# Patient Record
Sex: Female | Born: 1955 | Race: White | Hispanic: No | Marital: Married | Smoking: Current every day smoker
Health system: Southern US, Community
[De-identification: ages and names within clinical notes are randomized; demographics above are authoritative.]

## PROBLEM LIST (undated history)

## (undated) DIAGNOSIS — R519 Headache, unspecified: Secondary | ICD-10-CM

## (undated) DIAGNOSIS — K219 Gastro-esophageal reflux disease without esophagitis: Secondary | ICD-10-CM

## (undated) DIAGNOSIS — E039 Hypothyroidism, unspecified: Secondary | ICD-10-CM

## (undated) DIAGNOSIS — I10 Essential (primary) hypertension: Secondary | ICD-10-CM

## (undated) DIAGNOSIS — Z720 Tobacco use: Secondary | ICD-10-CM

## (undated) DIAGNOSIS — R51 Headache: Secondary | ICD-10-CM

## (undated) DIAGNOSIS — N189 Chronic kidney disease, unspecified: Secondary | ICD-10-CM

## (undated) DIAGNOSIS — E785 Hyperlipidemia, unspecified: Secondary | ICD-10-CM

## (undated) DIAGNOSIS — R079 Chest pain, unspecified: Secondary | ICD-10-CM

## (undated) HISTORY — DX: Chest pain, unspecified: R07.9

## (undated) HISTORY — PX: KIDNEY SURGERY: SHX687

## (undated) HISTORY — DX: Tobacco use: Z72.0

## (undated) HISTORY — DX: Hypothyroidism, unspecified: E03.9

## (undated) HISTORY — PX: OTHER SURGICAL HISTORY: SHX169

## (undated) HISTORY — DX: Gastro-esophageal reflux disease without esophagitis: K21.9

## (undated) HISTORY — DX: Chronic kidney disease, unspecified: N18.9

## (undated) HISTORY — DX: Hyperlipidemia, unspecified: E78.5

---

## 1999-09-05 ENCOUNTER — Emergency Department (HOSPITAL_COMMUNITY): Admission: EM | Admit: 1999-09-05 | Discharge: 1999-09-05 | Payer: Self-pay | Admitting: Emergency Medicine

## 1999-09-05 ENCOUNTER — Encounter: Payer: Self-pay | Admitting: Emergency Medicine

## 1999-09-08 ENCOUNTER — Emergency Department (HOSPITAL_COMMUNITY): Admission: EM | Admit: 1999-09-08 | Discharge: 1999-09-08 | Payer: Self-pay | Admitting: Emergency Medicine

## 2006-04-22 ENCOUNTER — Other Ambulatory Visit: Admission: RE | Admit: 2006-04-22 | Discharge: 2006-04-22 | Payer: Self-pay | Admitting: Obstetrics and Gynecology

## 2007-03-24 ENCOUNTER — Emergency Department (HOSPITAL_COMMUNITY): Admission: EM | Admit: 2007-03-24 | Discharge: 2007-03-24 | Payer: Self-pay | Admitting: Emergency Medicine

## 2009-08-06 ENCOUNTER — Emergency Department (HOSPITAL_COMMUNITY): Admission: EM | Admit: 2009-08-06 | Discharge: 2009-08-06 | Payer: Self-pay | Admitting: Emergency Medicine

## 2010-07-02 LAB — DIFFERENTIAL
Basophils Absolute: 0.1 10*3/uL (ref 0.0–0.1)
Basophils Relative: 1 % (ref 0–1)
Eosinophils Absolute: 0.2 10*3/uL (ref 0.0–0.7)
Eosinophils Relative: 4 % (ref 0–5)
Lymphocytes Relative: 29 % (ref 12–46)
Lymphs Abs: 1.5 10*3/uL (ref 0.7–4.0)
Monocytes Absolute: 0.4 10*3/uL (ref 0.1–1.0)
Monocytes Relative: 7 % (ref 3–12)
Neutro Abs: 3.1 10*3/uL (ref 1.7–7.7)
Neutrophils Relative %: 59 % (ref 43–77)

## 2010-07-02 LAB — POCT I-STAT, CHEM 8
BUN: 19 mg/dL (ref 6–23)
Calcium, Ion: 1.13 mmol/L (ref 1.12–1.32)
Chloride: 107 mEq/L (ref 96–112)
Creatinine, Ser: 0.7 mg/dL (ref 0.4–1.2)
Glucose, Bld: 98 mg/dL (ref 70–99)
HCT: 48 % — ABNORMAL HIGH (ref 36.0–46.0)
Hemoglobin: 16.3 g/dL — ABNORMAL HIGH (ref 12.0–15.0)
Potassium: 3.8 mEq/L (ref 3.5–5.1)
Sodium: 141 mEq/L (ref 135–145)
TCO2: 27 mmol/L (ref 0–100)

## 2010-07-02 LAB — CBC
HCT: 43 % (ref 36.0–46.0)
Hemoglobin: 14.4 g/dL (ref 12.0–15.0)
MCHC: 33.7 g/dL (ref 30.0–36.0)
MCV: 93.6 fL (ref 78.0–100.0)
Platelets: 326 10*3/uL (ref 150–400)
RBC: 4.59 MIL/uL (ref 3.87–5.11)
RDW: 13.8 % (ref 11.5–15.5)
WBC: 5.3 10*3/uL (ref 4.0–10.5)

## 2011-01-20 LAB — DIFFERENTIAL
Basophils Relative: 0
Eosinophils Relative: 0
Lymphs Abs: 1.5
Monocytes Absolute: 0.7

## 2011-01-20 LAB — CBC
HCT: 43.6
MCV: 92.4
Platelets: 327
RBC: 4.71
WBC: 13.9 — ABNORMAL HIGH

## 2011-01-20 LAB — BASIC METABOLIC PANEL
BUN: 17
Chloride: 103
Potassium: 3.9

## 2015-07-17 ENCOUNTER — Observation Stay (HOSPITAL_COMMUNITY): Payer: Self-pay

## 2015-07-17 ENCOUNTER — Observation Stay (HOSPITAL_COMMUNITY)
Admission: EM | Admit: 2015-07-17 | Discharge: 2015-07-18 | Disposition: A | Discharge status: Home / Self Care | Payer: Self-pay | Attending: Internal Medicine | Admitting: Internal Medicine

## 2015-07-17 ENCOUNTER — Emergency Department (HOSPITAL_COMMUNITY): Payer: Self-pay

## 2015-07-17 ENCOUNTER — Encounter (HOSPITAL_COMMUNITY): Payer: Self-pay | Admitting: Emergency Medicine

## 2015-07-17 DIAGNOSIS — E039 Hypothyroidism, unspecified: Secondary | ICD-10-CM | POA: Insufficient documentation

## 2015-07-17 DIAGNOSIS — R079 Chest pain, unspecified: Secondary | ICD-10-CM | POA: Insufficient documentation

## 2015-07-17 DIAGNOSIS — F17200 Nicotine dependence, unspecified, uncomplicated: Secondary | ICD-10-CM | POA: Insufficient documentation

## 2015-07-17 DIAGNOSIS — I160 Hypertensive urgency: Principal | ICD-10-CM | POA: Insufficient documentation

## 2015-07-17 DIAGNOSIS — R519 Headache, unspecified: Secondary | ICD-10-CM | POA: Insufficient documentation

## 2015-07-17 DIAGNOSIS — Z720 Tobacco use: Secondary | ICD-10-CM | POA: Diagnosis present

## 2015-07-17 DIAGNOSIS — R51 Headache: Secondary | ICD-10-CM

## 2015-07-17 DIAGNOSIS — I110 Hypertensive heart disease with heart failure: Secondary | ICD-10-CM | POA: Insufficient documentation

## 2015-07-17 DIAGNOSIS — I5032 Chronic diastolic (congestive) heart failure: Secondary | ICD-10-CM | POA: Insufficient documentation

## 2015-07-17 DIAGNOSIS — G43909 Migraine, unspecified, not intractable, without status migrainosus: Secondary | ICD-10-CM | POA: Insufficient documentation

## 2015-07-17 DIAGNOSIS — E785 Hyperlipidemia, unspecified: Secondary | ICD-10-CM | POA: Insufficient documentation

## 2015-07-17 DIAGNOSIS — K219 Gastro-esophageal reflux disease without esophagitis: Secondary | ICD-10-CM | POA: Insufficient documentation

## 2015-07-17 DIAGNOSIS — N189 Chronic kidney disease, unspecified: Secondary | ICD-10-CM | POA: Insufficient documentation

## 2015-07-17 DIAGNOSIS — I10 Essential (primary) hypertension: Secondary | ICD-10-CM | POA: Insufficient documentation

## 2015-07-17 HISTORY — DX: Chest pain, unspecified: R07.9

## 2015-07-17 HISTORY — DX: Tobacco use: Z72.0

## 2015-07-17 HISTORY — DX: Headache: R51

## 2015-07-17 HISTORY — DX: Headache, unspecified: R51.9

## 2015-07-17 HISTORY — DX: Essential (primary) hypertension: I10

## 2015-07-17 LAB — BASIC METABOLIC PANEL
Anion gap: 13 (ref 5–15)
BUN: 21 mg/dL — ABNORMAL HIGH (ref 6–20)
CALCIUM: 9.7 mg/dL (ref 8.9–10.3)
CO2: 28 mmol/L (ref 22–32)
CREATININE: 1.15 mg/dL — AB (ref 0.44–1.00)
Chloride: 100 mmol/L — ABNORMAL LOW (ref 101–111)
GFR calc Af Amer: 59 mL/min — ABNORMAL LOW (ref >60)
GFR calc non Af Amer: 51 mL/min — ABNORMAL LOW (ref >60)
GLUCOSE: 102 mg/dL — AB (ref 65–99)
Potassium: 3.7 mmol/L (ref 3.5–5.1)
Sodium: 141 mmol/L (ref 135–145)

## 2015-07-17 LAB — CBC WITH DIFFERENTIAL/PLATELET
BASOS ABS: 0 10*3/uL (ref 0.0–0.1)
Basophils Relative: 1 %
EOS ABS: 0.2 10*3/uL (ref 0.0–0.7)
EOS PCT: 3 %
HCT: 45 % (ref 36.0–46.0)
Hemoglobin: 14.4 g/dL (ref 12.0–15.0)
LYMPHS ABS: 2.4 10*3/uL (ref 0.7–4.0)
LYMPHS PCT: 31 %
MCH: 28.3 pg (ref 26.0–34.0)
MCHC: 32 g/dL (ref 30.0–36.0)
MCV: 88.6 fL (ref 78.0–100.0)
MONO ABS: 0.5 10*3/uL (ref 0.1–1.0)
Monocytes Relative: 7 %
Neutro Abs: 4.5 10*3/uL (ref 1.7–7.7)
Neutrophils Relative %: 58 %
PLATELETS: 356 10*3/uL (ref 150–400)
RBC: 5.08 MIL/uL (ref 3.87–5.11)
RDW: 13.2 % (ref 11.5–15.5)
WBC: 7.6 10*3/uL (ref 4.0–10.5)

## 2015-07-17 LAB — URINE MICROSCOPIC-ADD ON

## 2015-07-17 LAB — URINALYSIS, ROUTINE W REFLEX MICROSCOPIC
Bilirubin Urine: NEGATIVE
GLUCOSE, UA: NEGATIVE mg/dL
Ketones, ur: NEGATIVE mg/dL
Nitrite: NEGATIVE
PROTEIN: NEGATIVE mg/dL
SPECIFIC GRAVITY, URINE: 1.012 (ref 1.005–1.030)
pH: 6 (ref 5.0–8.0)

## 2015-07-17 LAB — TROPONIN I: Troponin I: <0.03 ng/mL (ref <0.031)

## 2015-07-17 LAB — TSH: TSH: 11.861 u[IU]/mL — ABNORMAL HIGH (ref 0.350–4.500)

## 2015-07-17 MED ORDER — ASPIRIN EC 81 MG PO TBEC
81.0000 mg | DELAYED_RELEASE_TABLET | Freq: Every day | ORAL | Status: DC
  Administered 2015-07-17 – 2015-07-18 (×2): 81 mg via ORAL
  Filled 2015-07-17 (×2): qty 1

## 2015-07-17 MED ORDER — SODIUM CHLORIDE 0.9% FLUSH: 3.0000 mL | Freq: Two times a day (BID) | INTRAVENOUS | Status: DC

## 2015-07-17 MED ORDER — GI COCKTAIL ~~LOC~~
30.0000 mL | Freq: Two times a day (BID) | ORAL | Status: DC | PRN
  Filled 2015-07-17: qty 30

## 2015-07-17 MED ORDER — ACETAMINOPHEN 650 MG RE SUPP: 650.0000 mg | Freq: Four times a day (QID) | RECTAL | Status: DC | PRN

## 2015-07-17 MED ORDER — TRAMADOL HCL 50 MG PO TABS
50.0000 mg | ORAL_TABLET | Freq: Four times a day (QID) | ORAL | Status: DC | PRN
  Administered 2015-07-17 – 2015-07-18 (×2): 50 mg via ORAL
  Filled 2015-07-17 (×2): qty 1

## 2015-07-17 MED ORDER — AMLODIPINE BESYLATE 5 MG PO TABS
5.0000 mg | ORAL_TABLET | Freq: Every day | ORAL | Status: DC
  Administered 2015-07-17 – 2015-07-18 (×2): 5 mg via ORAL
  Filled 2015-07-17 (×2): qty 1

## 2015-07-17 MED ORDER — LORATADINE 10 MG PO TABS
10.0000 mg | ORAL_TABLET | Freq: Every day | ORAL | Status: DC | PRN
  Filled 2015-07-17: qty 1

## 2015-07-17 MED ORDER — ACETAMINOPHEN 325 MG PO TABS
650.0000 mg | ORAL_TABLET | Freq: Four times a day (QID) | ORAL | Status: DC | PRN
  Administered 2015-07-17: 650 mg via ORAL
  Filled 2015-07-17: qty 2

## 2015-07-17 MED ORDER — ENOXAPARIN SODIUM 40 MG/0.4ML ~~LOC~~ SOLN
40.0000 mg | SUBCUTANEOUS | Status: DC
  Administered 2015-07-17: 40 mg via SUBCUTANEOUS
  Filled 2015-07-17 (×2): qty 0.4

## 2015-07-17 MED ORDER — ONDANSETRON HCL 4 MG/2ML IJ SOLN
4.0000 mg | Freq: Four times a day (QID) | INTRAMUSCULAR | Status: DC | PRN
  Administered 2015-07-18: 4 mg via INTRAVENOUS
  Filled 2015-07-17 (×3): qty 2

## 2015-07-17 MED ORDER — SODIUM CHLORIDE 0.9% FLUSH
3.0000 mL | Freq: Two times a day (BID) | INTRAVENOUS | Status: DC
  Administered 2015-07-17: 3 mL via INTRAVENOUS

## 2015-07-17 MED ORDER — NITROGLYCERIN 0.4 MG SL SUBL
0.4000 mg | SUBLINGUAL_TABLET | Freq: Once | SUBLINGUAL | Status: AC
  Administered 2015-07-17: 0.4 mg via SUBLINGUAL
  Filled 2015-07-17: qty 1

## 2015-07-17 MED ORDER — SODIUM CHLORIDE 0.9 % IV SOLN: 250.0000 mL | INTRAVENOUS | Status: DC | PRN

## 2015-07-17 MED ORDER — ONDANSETRON HCL 4 MG/2ML IJ SOLN
4.0000 mg | Freq: Once | INTRAMUSCULAR | Status: AC
  Administered 2015-07-17: 4 mg via INTRAVENOUS

## 2015-07-17 MED ORDER — HYDRALAZINE HCL 20 MG/ML IJ SOLN
10.0000 mg | Freq: Four times a day (QID) | INTRAMUSCULAR | Status: DC | PRN
  Filled 2015-07-17: qty 1

## 2015-07-17 MED ORDER — TRAMADOL HCL 50 MG PO TABS
25.0000 mg | ORAL_TABLET | Freq: Once | ORAL | Status: AC
  Administered 2015-07-17: 25 mg via ORAL
  Filled 2015-07-17: qty 1

## 2015-07-17 MED ORDER — ONDANSETRON HCL 4 MG PO TABS: 4.0000 mg | ORAL_TABLET | Freq: Four times a day (QID) | ORAL | Status: DC | PRN

## 2015-07-17 MED ORDER — CARVEDILOL 6.25 MG PO TABS
6.2500 mg | ORAL_TABLET | Freq: Two times a day (BID) | ORAL | Status: DC
  Administered 2015-07-17: 6.25 mg via ORAL
  Filled 2015-07-17 (×2): qty 1

## 2015-07-17 MED ORDER — PANTOPRAZOLE SODIUM 40 MG PO TBEC
40.0000 mg | DELAYED_RELEASE_TABLET | Freq: Every day | ORAL | Status: DC
  Administered 2015-07-17 – 2015-07-18 (×2): 40 mg via ORAL
  Filled 2015-07-17 (×2): qty 1

## 2015-07-17 MED ORDER — HYDRALAZINE HCL 20 MG/ML IJ SOLN
10.0000 mg | Freq: Once | INTRAMUSCULAR | Status: AC
  Administered 2015-07-17: 10 mg via INTRAVENOUS
  Filled 2015-07-17: qty 1

## 2015-07-17 MED ORDER — SODIUM CHLORIDE 0.9% FLUSH: 3.0000 mL | INTRAVENOUS | Status: DC | PRN

## 2015-07-17 NOTE — Care Management Note (Signed)
Case Management Note  Patient Details  Name: Danielle Berger MRN: [id number] Date of Birth: Jun 24, 1955  Subjective/Objective:   60 y/o f admitted w/hypertensive urgency. From home.ED CM has provided pcp listing.                 Action/Plan:d/c plan home.   Expected Discharge Date:   (unknown)               Expected Discharge Plan:  Home/Self Care  In-House Referral:     Discharge planning Services  CM Consult  Post Acute Care Choice:    Choice offered to:     DME Arranged:    DME Agency:     HH Arranged:    HH Agency:     Status of Service:  In process, will continue to follow  Medicare Important Message Given:    Date Medicare IM Given:    Medicare IM give by:    Date Additional Medicare IM Given:    Additional Medicare Important Message give by:     If discussed at [place] of Stay Meetings, dates discussed:    Additional Comments:  Dessa Phi, RN 07/17/2015, 3:33 PM

## 2015-07-17 NOTE — Progress Notes (Signed)
Entered in d/c instructions  Please use the resources provided to you in emergency room by case manager to assist with doctor for follow up Call As needed- A referral for you has been sent to [facility] if you have not received a call in 3 days you may contact them Call Sylvie Farrier at [email or web address] These [place] uninsured resources provide possible primary care providers, resources for discounted medications, housing, dental resources, affordable care act information, plus other resources for [facility]

## 2015-07-17 NOTE — Progress Notes (Signed)
CM spoke with pt and husband Ronalee Belts at the bedside who confirms uninsured [employer] resident with no pcp.  Ronalee Belts is seen by a [facility] group and is also going to attempt to ask the office manager there what it would take for pt to be seen for f/u care  CM discussed and provided written information for uninsured accepting pcps, discussed the importance of pcp vs EDP services for f/u care, www.needymeds.org, www.goodrx.com, discounted pharmacies and other [employer] such as [employer] [facility] [employer], affordable care act, financial assistance, uninsured dental services, [place] med assist, DSS and  health department  Reviewed resources for [employer] uninsured accepting pcps like [facility], family medicine at [employer], [facility] [place], [facility], local urgent care centers, [facility], [facility] family practice, general medical clinics, family services of the [place], [facility] urgent care plus others, medication resources, [facility] out patient [facility]ies and housing Pt voiced understanding and appreciation of resources provided   Provided [facility] contact information Pt agreed to a referral Cm completed referral Pt to be contact by [facility] clinical liason

## 2015-07-17 NOTE — H&P (Signed)
[facility] History and Physical  Danielle Berger [id number] DOB: [birth year] DOA: 07/17/2015  Referring physician: Dr.Pickering PCP: No primary care provider on file.   Chief Complaint: High blood pressure  HPI: Danielle Berger is a 60 y.o. female who presents with constant, throbbing headaches with photophobia for the past month that she rates 4/10. Her husband purchased a blood pressure cuff 3 days ago, and her blood pressure was 240/140. She has a past history of migraines, though she has not had one in years and feels this headache is different from her usual migraines. She also reports a burning sensation in the middle of her chest when she takes out the trash for 1 month. She has not seen a doctor in 20+ years and smokes 1/2PPD.   Review of Systems:  Constitutional:  No weight loss, night sweats, Fevers, chills, fatigue.   HEENT:  Difficulty swallowing,Tooth/dental problems,Sore throat,  No sneezing, itching, ear ache, nasal congestion, post nasal drip,   Cardio-vascular:  Orthopnea, PND, swelling in lower extremities, anasarca, dizziness, palpitations   GI:  No heartburn, indigestion, abdominal pain, nausea, vomiting, diarrhea, change in bowel habits, loss of appetite   Resp:  No shortness of breath with exertion or at rest. No excess mucus, no productive cough, No non-productive cough, No coughing up of blood.No change in color of mucus.No wheezing.No chest wall deformity   Skin:  no rash or lesions.   GU:  no dysuria, change in color of urine, no urgency or frequency. No flank pain.   Musculoskeletal:  No joint pain or swelling. No decreased range of motion. No back pain.   Psych:  No change in mood or affect. No depression or anxiety. No memory loss.   History reviewed. No pertinent past medical history. Past Surgical History  Procedure Laterality Date  . Brain tumor removed       when patient was in the 6th grade  . Kidney surgery      when patient  was int he 8th grade   Social History:  reports that she has been smoking.  She has never used smokeless tobacco. She reports that she does not drink alcohol. Her drug history is not on file.  No Known Allergies  History reviewed. No pertinent family history.   Prior to Admission medications   Medication Sig Start Date End Date Taking? Authorizing Provider  loratadine (CLARITIN) 10 MG tablet Take 10 mg by mouth daily as needed for allergies.   Yes Historical Provider, MD   Physical Exam: Filed Vitals:   07/17/15 0908 07/17/15 1120  BP: 223/115 200/114  Pulse: 65 65  Temp: 98.2 F (36.8 C) 98.6 F (37 C)  TempSrc: Oral Oral  Resp: 18 16  SpO2: 98% 98%    Wt Readings from Last 3 Encounters:  No data found for Wt    General:  Appears calm and comfortable Eyes: PERRL, normal lids, irises & conjunctiva ENT: grossly normal hearing, lips & tongue Neck: no LAD, masses or thyromegaly Cardiovascular: RRR, no m/r/g. No LE edema. Telemetry: SR, no arrhythmias  Respiratory: CTA bilaterally, no w/r/r. Normal respiratory effort. Abdomen: soft, ntnd Skin: no rash or induration seen on limited exam Musculoskeletal: grossly normal tone BUE/BLE Psychiatric: grossly normal mood and affect, speech fluent and appropriate Neurologic: grossly non-focal.          Labs on Admission:  Basic Metabolic Panel:  Recent Labs Lab 07/17/15 1011  NA 141  K 3.7  CL 100*  CO2 28  GLUCOSE 102*  BUN 21*  CREATININE 1.15*  CALCIUM 9.7   Liver Function Tests: No results for input(s): AST, ALT, ALKPHOS, BILITOT, PROT, ALBUMIN in the last 168 hours. No results for input(s): LIPASE, AMYLASE in the last 168 hours. No results for input(s): AMMONIA in the last 168 hours. CBC:  Recent Labs Lab 07/17/15 1011  WBC 7.6  NEUTROABS 4.5  HGB 14.4  HCT 45.0  MCV 88.6  PLT 356   Cardiac Enzymes:  Recent Labs Lab 07/17/15 1011  TROPONINI <0.03    BNP (last 3 results) No results for  input(s): BNP in the last 8760 hours.  ProBNP (last 3 results) No results for input(s): PROBNP in the last 8760 hours.  CBG: No results for input(s): GLUCAP in the last 168 hours.  Radiological Exams on Admission: Dg Chest 2 View  07/17/2015  CLINICAL DATA:  Cough. EXAM: CHEST  2 VIEW COMPARISON:  None. FINDINGS: The heart size and mediastinal contours are within normal limits. Both lungs are clear. The visualized skeletal structures are unremarkable. IMPRESSION: No active cardiopulmonary disease. Electronically Signed   By: Marijo Conception, M.D.   On: 07/17/2015 10:38    EKG: Independently reviewed. NSR  Assessment/Plan Principal Problem: Hypertensive urgency: BP was 223/115 on arrival, she has not seen a doctor in 20+ years and smokes 1/2PPD. Start Amlodipine PO 5mg , Coreg PO 6.25mg , and IV hydralazine 10mg . Will monitor blood pressure and reassess. If blood pressure is persistently high, we will then start her on  IV antihypertensives-but for now we will try and see if we can manage this with oral medications.  Active Problems: Headache: Likely from uncontrolled hypertension. Although she does have a history of migraine headaches-current headache is very different from her usual migraine headaches. Do not think we need any imaging at this point as she has a completely benign exam. Will monitor as blood pressure improves.   Exertional chest pain: Although exertional, it is burning in nature, initial EKG and Troponin negative. For now we will attempt better blood pressure control, obtain echocardiogram, check lipid panel and A1c. Will place on both aspirin and PPI, if echo shows significant wall motion abnormality, may need inpatient cardiology evaluation. But if troponins continue to be negative, suspect could pursue a outpatient risk stratification.  Tobacco abuse: Smokes 1/2 PPD.  Counseled  History of hypothyroidism: Has been off medication for years, will check TSH.  Chronic kidney  disease: Likely from long-standing hypertension, creatinine 1.15, GFR 51. Will check UA and monitor.  Code Status: Full DVT Prophylaxis: Lovenox, SCDs Family Communication: Husband at bedside Disposition Plan: Admit  Time spent: 30 minutes  Kara Dies PA-S [facility]  Attending MD note  Patient was seen, examined,treatment plan was discussed with the PA-S.  I have personally reviewed the clinical findings, lab, imaging studies and management of this patient in detail. I agree with the documentation, as recorded by the PA-S.   60 year old female with very remote history of hypothyroidism (used to take thyroid medications when she was a teenager) long-standing history of ongoing tobacco use-was not seen a physician for a number of "years"-brought to the ED by her husband for uncontrolled hypertension along with chest pain and headaches.  On exam-she is neurologically intact, heart sounds are regular without any murmurs. Abdomen is benign.  Will give her 1 dose of IV hydralazine, and then start oral amlodipine and Coreg. Will use prn IV Hydralazine.  Admit to tele and continue to optimize BP regimen.Cycle troponins and  obtain Echo  Rest as above.  [facility]

## 2015-07-17 NOTE — ED Provider Notes (Signed)
CSN: [id number]     Arrival date & time 07/17/15  0859 History   First MD Initiated Contact with Patient 07/17/15 [id number]     Chief Complaint  Patient presents with  . Hypertension      Patient is a 60 y.o. female presenting with hypertension. The history is provided by the patient and the spouse.  Hypertension Associated symptoms include chest pain, headaches and shortness of breath. Pertinent negatives include no abdominal pain.  Patient presents with high blood pressure. States she's been having headaches for a while now. States it is dull. States that her husband checked her blood pressure home and it was 240/140. States she does get occasional shortness of breath and chest pain. States it is a pressure middle of her chest. States that she gets more short of breath and more tightness if she attempts to walk to take out the trash cans. No swelling or legs. She does not see a doctor. She does smoke. States she's been getting headaches for while. They are throbbing and on the right side of her head.  History reviewed. No pertinent past medical history. Past Surgical History  Procedure Laterality Date  . Brain tumor removed       when patient was in the 6th grade  . Kidney surgery      when patient was int he 8th grade   History reviewed. No pertinent family history. Social History  Substance Use Topics  . Smoking status: Current Every Day Smoker  . Smokeless tobacco: Never Used  . Alcohol Use: No   OB History    No data available     Review of Systems  Constitutional: Negative for activity change and appetite change.  Eyes: Negative for pain.  Respiratory: Positive for cough and shortness of breath. Negative for chest tightness.   Cardiovascular: Positive for chest pain. Negative for leg swelling.  Gastrointestinal: Negative for nausea, vomiting, abdominal pain and diarrhea.  Genitourinary: Negative for flank pain.  Musculoskeletal: Negative for back pain and neck stiffness.   Skin: Negative for rash.  Neurological: Positive for headaches. Negative for weakness and numbness.  Psychiatric/Behavioral: Negative for behavioral problems.      Allergies  Review of patient's allergies indicates no known allergies.  Home Medications   Prior to Admission medications   Medication Sig Start Date End Date Taking? Authorizing Provider  loratadine (CLARITIN) 10 MG tablet Take 10 mg by mouth daily as needed for allergies.   Yes Historical Provider, MD   BP 200/114 mmHg  Pulse 65  Temp(Src) 98.6 F (37 C) (Oral)  Resp 16  SpO2 98% Physical Exam  Constitutional: She is oriented to person, place, and time. She appears well-developed and well-nourished.  HENT:  Head: Normocephalic and atraumatic.  Eyes: EOM are normal. Pupils are equal, round, and reactive to light.  Neck: Normal range of motion.  Cardiovascular: Normal rate, regular rhythm and normal heart sounds.   No murmur heard. Pulmonary/Chest: Effort normal and breath sounds normal. No respiratory distress. She has no wheezes. She has no rales.  Abdominal: Soft. Bowel sounds are normal. She exhibits no distension.  Musculoskeletal: Normal range of motion.  Neurological: She is alert and oriented to person, place, and time.  Skin: Skin is warm.  Psychiatric: Her speech is normal.  Nursing note and vitals reviewed.   ED Course  Procedures (including critical care time) Labs Review Labs Reviewed  BASIC METABOLIC PANEL - Abnormal; Notable for the following:    Chloride 100 (*)  Glucose, Bld 102 (*)    BUN 21 (*)    Creatinine, Ser 1.15 (*)    GFR calc non Af Amer 51 (*)    GFR calc Af Amer 59 (*)    All other components within normal limits  URINALYSIS, ROUTINE W REFLEX MICROSCOPIC (NOT AT [facility]) - Abnormal; Notable for the following:    Hgb urine dipstick TRACE (*)    Leukocytes, UA TRACE (*)    All other components within normal limits  URINE MICROSCOPIC-ADD ON - Abnormal; Notable for the  following:    Squamous Epithelial / LPF 6-30 (*)    Bacteria, UA MANY (*)    All other components within normal limits  URINE CULTURE  CBC WITH DIFFERENTIAL/PLATELET  TROPONIN I    Imaging Review Dg Chest 2 View  07/17/2015  CLINICAL DATA:  Cough. EXAM: CHEST  2 VIEW COMPARISON:  None. FINDINGS: The heart size and mediastinal contours are within normal limits. Both lungs are clear. The visualized skeletal structures are unremarkable. IMPRESSION: No active cardiopulmonary disease. Electronically Signed   By: Marijo Conception, M.D.   On: 07/17/2015 10:38   I have personally reviewed and evaluated these images and lab results as part of my medical decision-making.   EKG Interpretation   Date/Time:  Tuesday July 17 2015 09:13:33 EDT Ventricular Rate:  58 PR Interval:  143 QRS Duration: 98 QT Interval:  442 QTC Calculation: 434 R Axis:   75 Text Interpretation:  Sinus rhythm Probable left atrial enlargement  Confirmed by Alvino Chapel  MD, Ovid Curd [phone]) on 07/17/2015 9:52:07 AM      MDM   Final diagnoses:  Essential hypertension  Chest pain, unspecified chest pain type    Patient presented with hypertension and occasional chest pain. EKG and lab work overall reassuring but creatinine is mildly elevated. Patient reportedly has had some kidney problems in the past. Blood pressure is moderately to severely elevated here. Will admit to internal medicine since she's had chest pain.    Davonna Belling, MD 07/17/15 [phone]

## 2015-07-17 NOTE — ED Notes (Signed)
Per pt, states no PCP and hasnt been to MD in years-complaining of headache and vomiting-states BP high

## 2015-07-17 NOTE — ED Notes (Signed)
20 minute timer started 

## 2015-07-18 ENCOUNTER — Telehealth: Payer: Self-pay

## 2015-07-18 ENCOUNTER — Observation Stay (HOSPITAL_BASED_OUTPATIENT_CLINIC_OR_DEPARTMENT_OTHER): Payer: MEDICAID

## 2015-07-18 DIAGNOSIS — N183 Chronic kidney disease, stage 3 (moderate): Secondary | ICD-10-CM

## 2015-07-18 DIAGNOSIS — K219 Gastro-esophageal reflux disease without esophagitis: Secondary | ICD-10-CM

## 2015-07-18 DIAGNOSIS — R079 Chest pain, unspecified: Secondary | ICD-10-CM

## 2015-07-18 DIAGNOSIS — N189 Chronic kidney disease, unspecified: Secondary | ICD-10-CM | POA: Insufficient documentation

## 2015-07-18 DIAGNOSIS — I10 Essential (primary) hypertension: Secondary | ICD-10-CM | POA: Insufficient documentation

## 2015-07-18 DIAGNOSIS — R519 Headache, unspecified: Secondary | ICD-10-CM | POA: Insufficient documentation

## 2015-07-18 DIAGNOSIS — R51 Headache: Secondary | ICD-10-CM

## 2015-07-18 DIAGNOSIS — Z720 Tobacco use: Secondary | ICD-10-CM

## 2015-07-18 DIAGNOSIS — I160 Hypertensive urgency: Principal | ICD-10-CM

## 2015-07-18 DIAGNOSIS — E039 Hypothyroidism, unspecified: Secondary | ICD-10-CM

## 2015-07-18 DIAGNOSIS — E785 Hyperlipidemia, unspecified: Secondary | ICD-10-CM | POA: Insufficient documentation

## 2015-07-18 LAB — HEMOGLOBIN A1C
Hgb A1c MFr Bld: 5.8 % — ABNORMAL HIGH (ref 4.8–5.6)
MEAN PLASMA GLUCOSE: 120 mg/dL

## 2015-07-18 LAB — LIPID PANEL
CHOL/HDL RATIO: 8.6 ratio
Cholesterol: 317 mg/dL — ABNORMAL HIGH (ref 0–200)
HDL: 37 mg/dL — ABNORMAL LOW (ref >40)
LDL Cholesterol: 215 mg/dL — ABNORMAL HIGH (ref 0–99)
Triglycerides: 324 mg/dL — ABNORMAL HIGH (ref <150)
VLDL: 65 mg/dL — AB (ref 0–40)

## 2015-07-18 LAB — BASIC METABOLIC PANEL
Anion gap: 10 (ref 5–15)
BUN: 23 mg/dL — AB (ref 6–20)
CHLORIDE: 104 mmol/L (ref 101–111)
CO2: 28 mmol/L (ref 22–32)
Calcium: 9.7 mg/dL (ref 8.9–10.3)
Creatinine, Ser: 1.12 mg/dL — ABNORMAL HIGH (ref 0.44–1.00)
GFR calc Af Amer: >60 mL/min (ref >60)
GFR calc non Af Amer: 52 mL/min — ABNORMAL LOW (ref >60)
Glucose, Bld: 116 mg/dL — ABNORMAL HIGH (ref 65–99)
POTASSIUM: 3.6 mmol/L (ref 3.5–5.1)
Sodium: 142 mmol/L (ref 135–145)

## 2015-07-18 LAB — ECHOCARDIOGRAM COMPLETE
Height: 62 in
Weight: 2480 oz

## 2015-07-18 LAB — URINE CULTURE

## 2015-07-18 LAB — TROPONIN I

## 2015-07-18 MED ORDER — ASPIRIN 81 MG PO TBEC: 81.0000 mg | DELAYED_RELEASE_TABLET | Freq: Every day | ORAL | Status: AC

## 2015-07-18 MED ORDER — LEVOTHYROXINE SODIUM 100 MCG PO TABS: 100.0000 ug | ORAL_TABLET | Freq: Every day | ORAL | Status: DC

## 2015-07-18 MED ORDER — LEVOTHYROXINE SODIUM 100 MCG PO TABS
100.0000 ug | ORAL_TABLET | Freq: Every day | ORAL | Status: DC
  Administered 2015-07-18: 100 ug via ORAL
  Filled 2015-07-18: qty 1

## 2015-07-18 MED ORDER — AMLODIPINE BESYLATE 10 MG PO TABS: 10.0000 mg | ORAL_TABLET | Freq: Every day | ORAL | Status: DC

## 2015-07-18 MED ORDER — PRAVASTATIN SODIUM 40 MG PO TABS: 40.0000 mg | ORAL_TABLET | Freq: Every day | ORAL | Status: DC

## 2015-07-18 MED ORDER — CARVEDILOL 12.5 MG PO TABS
12.5000 mg | ORAL_TABLET | Freq: Two times a day (BID) | ORAL | Status: DC
Start: 2015-07-18 — End: 2015-07-18
  Administered 2015-07-18 (×2): 12.5 mg via ORAL
  Filled 2015-07-18 (×2): qty 1

## 2015-07-18 MED ORDER — PANTOPRAZOLE SODIUM 40 MG PO TBEC: 40.0000 mg | DELAYED_RELEASE_TABLET | Freq: Every day | ORAL | Status: DC

## 2015-07-18 MED ORDER — CARVEDILOL 12.5 MG PO TABS: 12.5000 mg | ORAL_TABLET | Freq: Two times a day (BID) | ORAL | Status: DC

## 2015-07-18 NOTE — Discharge Summary (Signed)
Physician Discharge Summary  Danielle Berger [id number] DOB: [birth year] DOA: 07/17/2015  PCP: No primary care provider on file.  Admit date: 07/17/2015 Discharge date: 07/18/2015  Time spent: 40 minutes  Recommendations for Outpatient Follow-up:  1. Reassess BP and adjust medications as needed  2. Repeat BMET to follow electrolytes and renal function  3. Repeat thyroid function test in 4-6 weeks; adjust synthroid if needed at that time 4. Repeat LFT's and lipid panel in 6-8 weeks; adjust statins as needed  5. If further CP recurred will benefit of outpatient stress test    Discharge Diagnoses:  Principal Problem:   Hypertensive urgency Active Problems:   Chest pain   Tobacco abuse   Essential hypertension   HLD (hyperlipidemia)   Thyroid activity decreased   Esophageal reflux Renal failure: presumed to be chronic base on GFR (stage 3) Chronic diastolic heart failure  Discharge Condition: stable and improved. Discharge home with instructions for heart healthy diet and appointment to establish/follow with PCP.  Diet recommendation: heart healthy/low sodium diet   Filed Weights   07/17/15 1426  Weight: 70.308 kg (155 lb)    History of present illness:  As per H&P written by Dr. Sloan Berger on 07/17/15 60 y.o. female who presents with constant, throbbing headaches with photophobia for the past month that Danielle Berger rates 4/10. Her husband purchased a blood pressure cuff 3 days ago, and her blood pressure was 240/140. Danielle Berger has a past history of migraines, though Danielle Berger has not had one in years and feels this headache is different from her usual migraines. Danielle Berger also reports a burning sensation in the middle of her chest when Danielle Berger takes out the trash for 1 month. Danielle Berger has not seen a doctor in 20+ years and smokes 1/2PPD.   Hospital Course:  1-hypertensive urgency: BP 223/115 on arrival. Received IV hydralazine in ED -improved with initiation of amlodipine and coreg -no HA's, no CP and no SOB at  discharge -BP with systolic in 99991111 range and diastolic BP in the 99991111 -discharge on amlodipine and coreg and advise to follow low sodium diet -advise to quit smoking -will follow up with PCP in 1-2 weeks for further medication adjustments  2-migraine: chronic. Component of HTN as part of HA's -advise to take medications as prescribed and to minimize use of NSAID's -B-blocker for BP will help to prevent HA as well  3-tobacco abuse: -cessation counseling provided  4-HLD: -discharge on pravachol -advise to follow heart healthy diet  -LDL 215, TG 324 and HDL 37  5-GERD: discharge on PPI  6-atypical CP: most likely due to GERD -no abnormalities on telemetry, neg troponin and EKG w/o acute ischemic changes -patient discharge on ASA, statins and B-blocker (for control of cormobidities and risk factors) -also on PPI -if further CP recurred will benefit of outpatient stratification  7-hypothyroidism: -discharge on synthroid  -TSH 11.861   8-renal failure: appears chronic base on GFR (stage 3) and associated with chronic hypertension  -will need outpatient follow up of renal function -advise to minimize use of NSAID's -BP control and low sodium diet  -advise to maintain good hydration   9-chronic diastolic HF -daily weights and low sodium diet recommended -EF preserved -continue BP control -no volume overload requiring diuretics currently    Procedures:  2-D echo: - Left ventricle: The cavity size was normal. Systolic function was  normal. The estimated ejection fraction was in the range of 60%  to 65%. Wall motion was normal; there were no regional wall  motion abnormalities. Features are consistent with a pseudonormal  left ventricular filling pattern, with concomitant abnormal  relaxation and increased filling pressure (grade 2 diastolic  dysfunction). Doppler parameters are consistent with high  ventricular filling pressure. - Aortic valve: Trileaflet; normal  thickness, mildly calcified  leaflets. There was trivial regurgitation. - Mitral valve: There was mild regurgitation.  Consultations:  None   Discharge Exam: Filed Vitals:   07/18/15 1337 07/18/15 1658  BP: 165/84 156/98  Pulse: 63 58  Temp: 97.8 F (36.6 C)   Resp: 16     General: afebrile, denies CP, SOB, HA's, nausea and vomiting. Reports some reflux symptoms, but otherwise stable and in no acute distress. Patient wants to go home  Cardiovascular: RRR, no rubs, no gallops, no murmurs Respiratory: CTA bilaterally Abd: soft, NT, ND, positive BS Extremities: no edema, no cyanosis   Discharge Instructions   Discharge Instructions    Diet - low sodium heart healthy    Complete by:  As directed      Discharge instructions    Complete by:  As directed   Take medications as prescribed Keep yourself well hydrated Follow a low sodium diet  Be compliant with outpatient follow up visit to establish care with PCP          Discharge Medication List as of 07/18/2015  4:33 PM    START taking these medications   Details  amLODipine (NORVASC) 10 MG tablet Take 1 tablet (10 mg total) by mouth daily., Starting 07/18/2015, Until Discontinued, Print    aspirin EC 81 MG EC tablet Take 1 tablet (81 mg total) by mouth daily., Starting 07/18/2015, Until Discontinued, OTC    carvedilol (COREG) 12.5 MG tablet Take 1 tablet (12.5 mg total) by mouth 2 (two) times daily with a meal., Starting 07/18/2015, Until Discontinued, Print    levothyroxine (SYNTHROID, LEVOTHROID) 100 MCG tablet Take 1 tablet (100 mcg total) by mouth daily before breakfast., Starting 07/18/2015, Until Discontinued, Print    pantoprazole (PROTONIX) 40 MG tablet Take 1 tablet (40 mg total) by mouth daily., Starting 07/18/2015, Until Discontinued, Print    pravastatin (PRAVACHOL) 40 MG tablet Take 1 tablet (40 mg total) by mouth daily., Starting 07/18/2015, Until Discontinued, Print      CONTINUE these medications which have NOT  CHANGED   Details  loratadine (CLARITIN) 10 MG tablet Take 10 mg by mouth daily as needed for allergies., Until Discontinued, Historical Med       No Known Allergies Follow-up Information    Call Please use the resources provided to you in emergency room by case manager to assist with doctor for follow up .   Why:  As needed- A referral for you has been sent to [facility] if you have not received a call in 3 days you may contact them Call Sylvie Farrier at [place] [email or web address]   Contact information:   These [place] uninsured resources provide possible primary care providers, resources for discounted medications, housing, dental resources, affordable care act information, plus other resources for [employer]        Follow up with [facility] 08/06/2015.   Why:  11a   Contact information:   [address] [place] [address] [phone]      The results of significant diagnostics from this hospitalization (including imaging, microbiology, ancillary and laboratory) are listed below for reference.    Significant Diagnostic Studies: Dg Chest 2 View  07/17/2015  CLINICAL DATA:  Cough. EXAM: CHEST  2 VIEW COMPARISON:  None. FINDINGS: The heart size and mediastinal contours are within normal limits. Both lungs are clear. The visualized skeletal structures are unremarkable. IMPRESSION: No active cardiopulmonary disease. Electronically Signed   By: Marijo Conception, M.D.   On: 07/17/2015 10:38   Ct Head Wo Contrast  07/17/2015  CLINICAL DATA:  59 year old presenting with intractable right-sided headache over the past 3 days associated with nausea. Current hypertension. EXAM: CT HEAD WITHOUT CONTRAST TECHNIQUE: Contiguous axial images were obtained from the base of the skull through the vertex without intravenous contrast. COMPARISON:  03/24/2007. FINDINGS: Ventricular system  normal in size and appearance for age. No mass lesion. No midline shift. No acute hemorrhage or hematoma. No extra-axial fluid collections. No evidence of acute infarction. No significant interval change. No skull fracture or other focal osseous abnormality involving the skull. Visualized paranasal sinuses, bilateral mastoid air cells and bilateral middle ear cavities well-aerated. Mild bilateral carotid siphon atherosclerosis. IMPRESSION: 1. No acute intracranial abnormality. 2. Mild bilateral carotid siphon atherosclerosis, progressive since 2008. Electronically Signed   By: Evangeline Dakin M.D.   On: 07/17/2015 18:36    Microbiology: Recent Results (from the past 240 hour(s))  Urine culture     Status: None   Collection Time: 07/17/15 10:17 AM  Result Value Ref Range Status   Specimen Description URINE, CLEAN CATCH  Final   Special Requests NONE  Final   Culture MULTIPLE SPECIES PRESENT, SUGGEST RECOLLECTION  Final   Report Status 07/18/2015 FINAL  Final     Labs: Basic Metabolic Panel:  Recent Labs Lab 07/17/15 1011 07/18/15 0236  NA 141 142  K 3.7 3.6  CL 100* 104  CO2 28 28  GLUCOSE 102* 116*  BUN 21* 23*  CREATININE 1.15* 1.12*  CALCIUM 9.7 9.7   CBC:  Recent Labs Lab 07/17/15 1011  WBC 7.6  NEUTROABS 4.5  HGB 14.4  HCT 45.0  MCV 88.6  PLT 356   Cardiac Enzymes:  Recent Labs Lab 07/17/15 1011 07/17/15 1446 07/17/15 2050 07/18/15 0236  TROPONINI <0.03 <0.03 <0.03 <0.03   Signed:  Barton Dubois MD.  [facility] 07/18/2015, 5:18 PM

## 2015-07-18 NOTE — Progress Notes (Signed)
Patient is alert and oriented x4 ambulatory. Discharge instructions reviewed, questions, concerns denied.

## 2015-07-18 NOTE — Telephone Encounter (Signed)
Message received from Dessa Phi, RN CM requesting a hospital follow up appointment for the patient at [facility]. An appointment was scheduled for 08/06/15 @ 1100 and the information was placed on the AVS.  Update provided to K. Mahabir. RN CM

## 2015-07-18 NOTE — Care Management Note (Signed)
Case Management Note  Patient Details  Name: Danielle Berger MRN: [id number] Date of Birth: 11/18/1955  Subjective/Objective: Provided patient w/pcp listing-she is contacting her spouse to check if he made an appt w/[facility] for patient's pcp-if not she may agree to allow CM to set up appt w/[facility] or she may decide to get a pcp on her own.Patient will use her spouse's pharmacy.                   Action/Plan:d/c plan home.   Expected Discharge Date:   (unknown)               Expected Discharge Plan:  Home/Self Care  In-House Referral:  PCP / [facility]  Discharge planning Services  CM Consult, [facility]  Post Acute Care Choice:    Choice offered to:     DME Arranged:    DME Agency:     HH Arranged:    HH Agency:     Status of Service:  In process, will continue to follow  Medicare Important Message Given:    Date Medicare IM Given:    Medicare IM give by:    Date Additional Medicare IM Given:    Additional Medicare Important Message give by:     If discussed at [place] of Stay Meetings, dates discussed:    Additional Comments:  Dessa Phi, RN 07/18/2015, 1:30 PM

## 2015-07-18 NOTE — Progress Notes (Signed)
Echocardiogram 2D Echocardiogram has been performed.  Joelene Millin 07/18/2015, 4:10 PM

## 2015-07-18 NOTE — Plan of Care (Signed)
Problem: Education: Goal: Knowledge of [facility] Education information/materials will improve Outcome: Completed/Met Date Met:  07/18/15 Education provided regarding HTN, risk factors, treatment. Compliance of medication importance

## 2015-07-19 MED FILL — ?CARVEDILOL 12.5 MG TABLET: 12.5 | 30 days supply | Qty: 60 | Fill #0

## 2015-07-19 MED FILL — ?AMLODIPINE BESYLATE 10 MG: 10 | 30 days supply | Qty: 30 | Fill #0

## 2015-07-19 MED FILL — PRAVASTATIN NA 40 MG TAB: 40 | 30 days supply | Qty: 30 | Fill #0

## 2015-07-19 MED FILL — ?PANTOPRAZOLE SOD DR 40MG: 40 MG | 30 days supply | Qty: 30 | Fill #0

## 2015-07-19 MED FILL — ?LEVOTHYROXINE 100 MCG TAB: 100 | 30 days supply | Qty: 30 | Fill #0

## 2015-08-06 ENCOUNTER — Encounter: Payer: Self-pay | Admitting: Clinical

## 2015-08-06 ENCOUNTER — Ambulatory Visit: Payer: Self-pay | Attending: Family Medicine | Admitting: Family Medicine

## 2015-08-06 VITALS — BP 137/78 | HR 65 | Temp 98.4°F | Resp 16 | Ht 62.0 in | Wt 152.8 lb

## 2015-08-06 DIAGNOSIS — E785 Hyperlipidemia, unspecified: Secondary | ICD-10-CM | POA: Insufficient documentation

## 2015-08-06 DIAGNOSIS — R0789 Other chest pain: Secondary | ICD-10-CM | POA: Insufficient documentation

## 2015-08-06 DIAGNOSIS — R5383 Other fatigue: Secondary | ICD-10-CM | POA: Insufficient documentation

## 2015-08-06 DIAGNOSIS — I10 Essential (primary) hypertension: Secondary | ICD-10-CM | POA: Insufficient documentation

## 2015-08-06 DIAGNOSIS — Z720 Tobacco use: Secondary | ICD-10-CM

## 2015-08-06 DIAGNOSIS — E039 Hypothyroidism, unspecified: Secondary | ICD-10-CM | POA: Insufficient documentation

## 2015-08-06 DIAGNOSIS — R51 Headache: Secondary | ICD-10-CM | POA: Insufficient documentation

## 2015-08-06 DIAGNOSIS — Z7982 Long term (current) use of aspirin: Secondary | ICD-10-CM | POA: Insufficient documentation

## 2015-08-06 DIAGNOSIS — E038 Other specified hypothyroidism: Secondary | ICD-10-CM

## 2015-08-06 DIAGNOSIS — F17210 Nicotine dependence, cigarettes, uncomplicated: Secondary | ICD-10-CM | POA: Insufficient documentation

## 2015-08-06 DIAGNOSIS — Z79899 Other long term (current) drug therapy: Secondary | ICD-10-CM | POA: Insufficient documentation

## 2015-08-06 NOTE — Progress Notes (Signed)
Subjective:  Patient ID: Danielle Berger, female    DOB: Oct 09, 1955  Age: 60 y.o. MRN: [id number]  CC: Hypertension and Hospitalization Follow-up   HPI Danielle Berger presents today for follow-up from hospitalization after hospitalized at [facility] from 07/17/15- 07/18/15 after she had presented with headaches and was found to have Hypertensive urgency with systolic BP of 99991111, also had chest burning.  She was ruled out for acute coronary syndrome, 2-D echo revealed EF of 123456, grade 2 diastolic dysfunction. She was placed on antihypertensives with improvement in her blood pressure. PPI was commenced for atypical chest pain likely due to GERD. Statin was also commenced due to elevated lipids and Synthroid started due to incidental finding of elevated TSH at 11.861. Her symptoms improved and she was subsequently discharged.  She denies chest pain, reflux, headaches at this time but admits to being fatigued and "having low energy" especially when she has to take out the trash. Denies shortness of breath, pedal edema. She continues to smoke half a pack of cigarettes a day and is not ready to quit yet.  Outpatient Prescriptions Prior to Visit  Medication Sig Dispense Refill  . amLODipine (NORVASC) 10 MG tablet Take 1 tablet (10 mg total) by mouth daily. 30 tablet 1  . aspirin EC 81 MG EC tablet Take 1 tablet (81 mg total) by mouth daily.    . carvedilol (COREG) 12.5 MG tablet Take 1 tablet (12.5 mg total) by mouth 2 (two) times daily with a meal. 60 tablet 1  . levothyroxine (SYNTHROID, LEVOTHROID) 100 MCG tablet Take 1 tablet (100 mcg total) by mouth daily before breakfast. 30 tablet 1  . loratadine (CLARITIN) 10 MG tablet Take 10 mg by mouth daily as needed for allergies.    . pantoprazole (PROTONIX) 40 MG tablet Take 1 tablet (40 mg total) by mouth daily. 30 tablet 1  . pravastatin (PRAVACHOL) 40 MG tablet Take 1 tablet (40 mg total) by mouth daily. 30 tablet 1   No  facility-administered medications prior to visit.    ROS Review of Systems  Constitutional: Negative for activity change, appetite change and fatigue.  HENT: Negative for congestion, sinus pressure and sore throat.   Eyes: Negative for visual disturbance.  Respiratory: Negative for cough, chest tightness, shortness of breath and wheezing.   Cardiovascular: Negative for chest pain and palpitations.  Gastrointestinal: Negative for abdominal pain, constipation and abdominal distention.  Endocrine: Negative for polydipsia.  Genitourinary: Negative for dysuria and frequency.  Musculoskeletal: Negative for back pain and arthralgias.  Skin: Negative for rash.  Neurological: Negative for tremors, light-headedness and numbness.  Hematological: Does not bruise/bleed easily.  Psychiatric/Behavioral: Negative for behavioral problems and agitation.    Objective:  BP 137/78 mmHg  Pulse 65  Temp(Src) 98.4 F (36.9 C) (Oral)  Resp 16  Ht 5\' 2"  (1.575 m)  Wt 152 lb 12.8 oz (69.31 kg)  BMI 27.94 kg/m2  SpO2 99%  BP/Weight 08/06/2015 0000000 [id number]  Systolic BP 0000000 A999333 -  Diastolic BP 78 98 -  Wt. (Lbs) 152.8 - 155  BMI 27.94 - 28.34      Physical Exam  Constitutional: She is oriented to person, place, and time. She appears well-developed and well-nourished.  Cardiovascular: Normal rate, normal heart sounds and intact distal pulses.   No murmur heard. Pulmonary/Chest: Effort normal and breath sounds normal. She has no wheezes. She has no rales. She exhibits no tenderness.  Abdominal: Soft. Bowel sounds are normal. She exhibits  no distension and no mass. There is no tenderness.  Musculoskeletal: Normal range of motion.  Neurological: She is alert and oriented to person, place, and time.  Skin: Skin is warm and dry.  Psychiatric: She has a normal mood and affect.   CMP Latest Ref Rng 07/18/2015 07/17/2015 08/06/2009  Glucose 65 - 99 mg/dL 116(H) 102(H) 98  BUN 6 - 20 mg/dL 23(H) 21(H) 19   Creatinine 0.44 - 1.00 mg/dL 1.12(H) 1.15(H) 0.7  Sodium 135 - 145 mmol/L 142 141 141  Potassium 3.5 - 5.1 mmol/L 3.6 3.7 3.8  Chloride 101 - 111 mmol/L 104 100(L) 107  CO2 22 - 32 mmol/L 28 28 -  Calcium 8.9 - 10.3 mg/dL 9.7 9.7 -     Lab Results  Component Value Date   TSH 11.861* 07/17/2015      Assessment & Plan:   1. Essential hypertension Controlled Continue antihypertensives Low-sodium DASH diet  2. Tobacco abuse Smoking cessation support: smoking cessation hotline: 1-800-QUIT-NOW.  Smoking cessation classes are available through [facility]. Call [phone] or visit our website at [email or web address]  Spent 3 minutes counseling on smoking cessation and patient is not ready to quit.  3. HLD (hyperlipidemia) Uncontrolled Currently on statin  4. Other specified hypothyroidism Uncontrolled with elevated TSH which could explain fatigue Recently commenced on Synthroid. Thyroid function in 6 weeks  5. Atypical chest pain Resolved She remains on a PPI presumptively for GI etiology. Will discontinue PPI at next office visit   No orders of the defined types were placed in this encounter.    Follow-up: Return in about 6 weeks (around 09/17/2015), or if symptoms worsen or fail to improve, for follow up of fatigue.   Arnoldo Morale MD

## 2015-08-06 NOTE — Progress Notes (Signed)
Patient's here for hospital f/up HTN.  Patient denies any pain today.

## 2015-08-06 NOTE — Patient Instructions (Signed)
Smoking Cessation, Tips for Success If you are ready to quit smoking, congratulations! You have chosen to help yourself be healthier. Cigarettes bring nicotine, tar, carbon monoxide, and other irritants into your body. Your lungs, heart, and blood vessels will be able to work better without these poisons. There are many different ways to quit smoking. Nicotine gum, nicotine patches, a nicotine inhaler, or nicotine nasal spray can help with physical craving. Hypnosis, support groups, and medicines help break the habit of smoking. WHAT THINGS CAN I DO TO MAKE QUITTING EASIER?  Here are some tips to help you quit for good:  Pick a date when you will quit smoking completely. Tell all of your friends and family about your plan to quit on that date.  Do not try to slowly cut down on the number of cigarettes you are smoking. Pick a quit date and quit smoking completely starting on that day.  Throw away all cigarettes.   Clean and remove all ashtrays from your home, work, and car.  On a card, write down your reasons for quitting. Carry the card with you and read it when you get the urge to smoke.  Cleanse your body of nicotine. Drink enough water and fluids to keep your urine clear or pale yellow. Do this after quitting to flush the nicotine from your body.  Learn to predict your moods. Do not let a bad situation be your excuse to have a cigarette. Some situations in your life might tempt you into wanting a cigarette.  Never have "just one" cigarette. It leads to wanting another and another. Remind yourself of your decision to quit.  Change habits associated with smoking. If you smoked while driving or when feeling stressed, try other activities to replace smoking. Stand up when drinking your coffee. Brush your teeth after eating. Sit in a different chair when you read the paper. Avoid alcohol while trying to quit, and try to drink fewer caffeinated beverages. Alcohol and caffeine may urge you to  smoke.  Avoid foods and drinks that can trigger a desire to smoke, such as sugary or spicy foods and alcohol.  Ask people who smoke not to smoke around you.  Have something planned to do right after eating or having a cup of coffee. For example, plan to take a walk or exercise.  Try a relaxation exercise to calm you down and decrease your stress. Remember, you may be tense and nervous for the first 2 weeks after you quit, but this will pass.  Find new activities to keep your hands busy. Play with a pen, coin, or rubber band. Doodle or draw things on paper.  Brush your teeth right after eating. This will help cut down on the craving for the taste of tobacco after meals. You can also try mouthwash.   Use oral substitutes in place of cigarettes. Try using lemon drops, carrots, cinnamon sticks, or chewing gum. Keep them handy so they are available when you have the urge to smoke.  When you have the urge to smoke, try deep breathing.  Designate your home as a nonsmoking area.  If you are a heavy smoker, ask your health care provider about a prescription for nicotine chewing gum. It can ease your withdrawal from nicotine.  Reward yourself. Set aside the cigarette money you save and buy yourself something nice.  Look for support from others. Join a support group or smoking cessation program. Ask someone at home or at work to help you with your plan   to quit smoking.  Always ask yourself, "Do I need this cigarette or is this just a reflex?" Tell yourself, "Today, I choose not to smoke," or "I do not want to smoke." You are reminding yourself of your decision to quit.  Do not replace cigarette smoking with electronic cigarettes (commonly called e-cigarettes). The safety of e-cigarettes is unknown, and some may contain harmful chemicals.  If you relapse, do not give up! Plan ahead and think about what you will do the next time you get the urge to smoke. HOW WILL I FEEL WHEN I QUIT SMOKING? You  may have symptoms of withdrawal because your body is used to nicotine (the addictive substance in cigarettes). You may crave cigarettes, be irritable, feel very hungry, cough often, get headaches, or have difficulty concentrating. The withdrawal symptoms are only temporary. They are strongest when you first quit but will go away within 10-14 days. When withdrawal symptoms occur, stay in control. Think about your reasons for quitting. Remind yourself that these are signs that your body is healing and getting used to being without cigarettes. Remember that withdrawal symptoms are easier to treat than the major diseases that smoking can cause.  Even after the withdrawal is over, expect periodic urges to smoke. However, these cravings are generally short lived and will go away whether you smoke or not. Do not smoke! WHAT RESOURCES ARE AVAILABLE TO HELP ME QUIT SMOKING? Your health care provider can direct you to community resources or hospitals for support, which may include:  Group support.  Education.  Hypnosis.  Therapy.   This information is not intended to replace advice given to you by your health care provider. Make sure you discuss any questions you have with your health care provider.   Document Released: 12/28/2003 Document Revised: 04/21/2014 Document Reviewed: 09/16/2012 Elsevier Interactive Patient Education 2016 [employer].  

## 2015-08-06 NOTE — Progress Notes (Signed)
Depression screen [facility] 2/9 08/06/2015  Decreased Interest 3  Down, Depressed, Hopeless 2  PHQ - 2 Score 5  Altered sleeping 3  Tired, decreased energy 3  Change in appetite 2  Feeling bad or failure about yourself  2  Trouble concentrating 1  Moving slowly or fidgety/restless 0  Suicidal thoughts 0  PHQ-9 Score 16    GAD 7 : Generalized Anxiety Score 08/06/2015  Nervous, Anxious, on Edge 0  Control/stop worrying 0  Worry too much - different things 0  Trouble relaxing 0  Restless 0  Easily annoyed or irritable 1  Afraid - awful might happen 0  Total GAD 7 Score 1

## 2015-08-07 ENCOUNTER — Encounter: Payer: Self-pay | Admitting: Family Medicine

## 2015-08-14 MED FILL — CARVEDILOL 12.5 MG TABLET: 12.5 | 30 days supply | Qty: 60 | Fill #1

## 2015-08-14 MED FILL — PRAVASTATIN NA 40 MG TAB: 40 | 30 days supply | Qty: 30 | Fill #1

## 2015-08-14 MED FILL — ?AMLODIPINE BESYLATE 10 MG: 10 | 30 days supply | Qty: 30 | Fill #1

## 2015-08-14 MED FILL — ?PANTOPRAZOLE SOD DR 40MG: 40 MG | 30 days supply | Qty: 30 | Fill #1

## 2015-08-14 MED FILL — ?LEVOTHYROXINE 100 MCG TAB: 100 | 30 days supply | Qty: 30 | Fill #1

## 2015-09-03 ENCOUNTER — Telehealth: Payer: Self-pay | Admitting: Family Medicine

## 2015-09-03 NOTE — Telephone Encounter (Signed)
Pt. Called stating that for the last couple of days her ankles have been swelling and redness is starting to appear. Please f/u with pt. For advise.

## 2015-09-14 ENCOUNTER — Other Ambulatory Visit: Payer: Self-pay | Admitting: Pharmacist

## 2015-09-14 ENCOUNTER — Other Ambulatory Visit: Payer: Self-pay | Admitting: Family Medicine

## 2015-09-14 MED ORDER — AMLODIPINE BESYLATE 10 MG PO TABS
10.0000 mg | ORAL_TABLET | Freq: Every day | ORAL | Status: DC
Start: 2015-09-14 — End: 2015-09-14

## 2015-09-14 MED ORDER — PRAVASTATIN SODIUM 40 MG PO TABS: 40.0000 mg | ORAL_TABLET | Freq: Every day | ORAL | Status: DC

## 2015-09-14 MED ORDER — CARVEDILOL 12.5 MG PO TABS: 12.5000 mg | ORAL_TABLET | Freq: Two times a day (BID) | ORAL | Status: DC

## 2015-09-14 MED ORDER — LEVOTHYROXINE SODIUM 100 MCG PO TABS: 100.0000 ug | ORAL_TABLET | Freq: Every day | ORAL | Status: DC

## 2015-09-14 MED ORDER — PANTOPRAZOLE SODIUM 40 MG PO TBEC: 40.0000 mg | DELAYED_RELEASE_TABLET | Freq: Every day | ORAL | Status: DC

## 2015-09-14 MED ORDER — AMLODIPINE BESYLATE 10 MG PO TABS: 10.0000 mg | ORAL_TABLET | Freq: Every day | ORAL | Status: DC

## 2015-09-14 MED FILL — PRAVASTATIN NA 40 MG TAB: 40 | 30 days supply | Qty: 30 | Fill #0

## 2015-09-14 MED FILL — AMLODIPINE BESYLATE 10 MG T: 10 | 30 days supply | Qty: 30 | Fill #0

## 2015-09-14 MED FILL — ?PANTOPRAZOLE SOD DR 40MG: 40 MG | 30 days supply | Qty: 30 | Fill #0

## 2015-09-14 MED FILL — ?LEVOTHYROXINE 100 MCG TAB: 100 | 30 days supply | Qty: 30 | Fill #0

## 2015-09-14 MED FILL — ?CARVEDILOL 12.5 MG TABLET: 12.5 | 30 days supply | Qty: 60 | Fill #0

## 2015-09-19 ENCOUNTER — Encounter: Payer: Self-pay | Admitting: Family Medicine

## 2015-09-19 ENCOUNTER — Ambulatory Visit: Payer: Self-pay | Attending: Family Medicine | Admitting: Family Medicine

## 2015-09-19 VITALS — BP 159/84 | HR 66 | Temp 97.7°F | Resp 18 | Ht 62.0 in | Wt 149.6 lb

## 2015-09-19 DIAGNOSIS — R229 Localized swelling, mass and lump, unspecified: Secondary | ICD-10-CM

## 2015-09-19 DIAGNOSIS — I10 Essential (primary) hypertension: Secondary | ICD-10-CM

## 2015-09-19 DIAGNOSIS — Z720 Tobacco use: Secondary | ICD-10-CM

## 2015-09-19 DIAGNOSIS — Z79899 Other long term (current) drug therapy: Secondary | ICD-10-CM | POA: Insufficient documentation

## 2015-09-19 DIAGNOSIS — E039 Hypothyroidism, unspecified: Secondary | ICD-10-CM | POA: Insufficient documentation

## 2015-09-19 DIAGNOSIS — E785 Hyperlipidemia, unspecified: Secondary | ICD-10-CM

## 2015-09-19 DIAGNOSIS — E038 Other specified hypothyroidism: Secondary | ICD-10-CM

## 2015-09-19 DIAGNOSIS — Z7982 Long term (current) use of aspirin: Secondary | ICD-10-CM | POA: Insufficient documentation

## 2015-09-19 LAB — TSH: TSH: 0.11 m[IU]/L — AB

## 2015-09-19 MED ORDER — BUPROPION HCL ER (SR) 150 MG PO TB12
150.0000 mg | ORAL_TABLET | Freq: Two times a day (BID) | ORAL | Status: DC
Start: 2015-09-19 — End: 2015-12-19

## 2015-09-19 MED FILL — BUPROPION SR 150 MG TABLET: 150 | 30 days supply | Qty: 60 | Fill #0

## 2015-09-19 NOTE — Progress Notes (Signed)
Subjective:  Patient ID: Danielle Berger, female    DOB: 10/13/1955  Age: 60 y.o. MRN: [id number]  CC: Follow-up   HPI ANNGELA ASH is a 60 year old female with history of hypertension, hyperlipidemia, hypothyroidism, tobacco abuse who presents today for follow-up visit.  She ran out of her antihypertensives a few days ago hence elevated blood pressure had been compliant prior to that. She had an elevated TSH at last visit and has been taking levothyroxine as prescribed.  Complains of a one year history of the left anterior thigh swelling which started out as a spider bite and has now formed a lump which is painful and bleeds every time she knocks it against something. She would like to have it removed. Also complains of some intermittent pedal edema which is absent now; denies shortness of breath or chest pains orthopnea.  Continues to smoke half pack to one full pack of cigarettes a day and would like to quit.  Outpatient Prescriptions Prior to Visit  Medication Sig Dispense Refill  . amLODipine (NORVASC) 10 MG tablet Take 1 tablet (10 mg total) by mouth daily. 30 tablet 2  . aspirin EC 81 MG EC tablet Take 1 tablet (81 mg total) by mouth daily.    . carvedilol (COREG) 12.5 MG tablet Take 1 tablet (12.5 mg total) by mouth 2 (two) times daily with a meal. 60 tablet 2  . levothyroxine (SYNTHROID, LEVOTHROID) 100 MCG tablet Take 1 tablet (100 mcg total) by mouth daily before breakfast. 30 tablet 2  . loratadine (CLARITIN) 10 MG tablet Take 10 mg by mouth daily as needed for allergies.    . pantoprazole (PROTONIX) 40 MG tablet Take 1 tablet (40 mg total) by mouth daily. 30 tablet 2  . pravastatin (PRAVACHOL) 40 MG tablet Take 1 tablet (40 mg total) by mouth daily. 30 tablet 2  . amLODipine (NORVASC) 10 MG tablet Take 1 tablet (10 mg total) by mouth daily. 30 tablet 1  . carvedilol (COREG) 12.5 MG tablet Take 1 tablet (12.5 mg total) by mouth 2 (two) times daily with a meal. 60 tablet 1   . levothyroxine (SYNTHROID, LEVOTHROID) 100 MCG tablet Take 1 tablet (100 mcg total) by mouth daily before breakfast. 30 tablet 1  . pantoprazole (PROTONIX) 40 MG tablet Take 1 tablet (40 mg total) by mouth daily. 30 tablet 1  . pravastatin (PRAVACHOL) 40 MG tablet Take 1 tablet (40 mg total) by mouth daily. 30 tablet 1   No facility-administered medications prior to visit.    ROS Review of Systems  Constitutional: Negative for activity change, appetite change and fatigue.  HENT: Negative for congestion, sinus pressure and sore throat.   Eyes: Negative for visual disturbance.  Respiratory: Negative for cough, chest tightness, shortness of breath and wheezing.   Cardiovascular: Negative for chest pain and palpitations.  Gastrointestinal: Negative for abdominal pain, constipation and abdominal distention.  Endocrine: Negative for polydipsia.  Genitourinary: Negative for dysuria and frequency.  Musculoskeletal: Negative for back pain and arthralgias.  Skin: Negative for rash.  Neurological: Negative for tremors, light-headedness and numbness.  Hematological: Does not bruise/bleed easily.  Psychiatric/Behavioral: Negative for behavioral problems and agitation.    Objective:  BP 159/84 mmHg  Pulse 66  Temp(Src) 97.7 F (36.5 C) (Oral)  Resp 18  Ht 5\' 2"  (1.575 m)  Wt 149 lb 9.6 oz (67.858 kg)  BMI 27.36 kg/m2  SpO2 96%  BP/Weight 09/19/2015 99991111 [id number]  Systolic BP Q000111Q 0000000 [id number]  Diastolic BP  84 78 98  Wt. (Lbs) 149.6 152.8 -  BMI 27.36 27.94 -      Physical Exam  Constitutional: She is oriented to person, place, and time. She appears well-developed and well-nourished.  Cardiovascular: Normal rate, normal heart sounds and intact distal pulses.   No murmur heard. Pulmonary/Chest: Effort normal and breath sounds normal. She has no wheezes. She has no rales. She exhibits no tenderness.  Abdominal: Soft. Bowel sounds are normal. She exhibits no distension and no mass.  There is no tenderness.  Musculoskeletal: Normal range of motion.  Neurological: She is alert and oriented to person, place, and time.  Skin:  Left thigh pending skin lump measuring 4 x 4 centimeters, mildly tender, well vascularized.    CMP Latest Ref Rng 07/18/2015 07/17/2015 08/06/2009  Glucose 65 - 99 mg/dL 116(H) 102(H) 98  BUN 6 - 20 mg/dL 23(H) 21(H) 19  Creatinine 0.44 - 1.00 mg/dL 1.12(H) 1.15(H) 0.7  Sodium 135 - 145 mmol/L 142 141 141  Potassium 3.5 - 5.1 mmol/L 3.6 3.7 3.8  Chloride 101 - 111 mmol/L 104 100(L) 107  CO2 22 - 32 mmol/L 28 28 -  Calcium 8.9 - 10.3 mg/dL 9.7 9.7 -     Lipid Panel     Component Value Date/Time   CHOL 317* 07/18/2015 0236   TRIG 324* 07/18/2015 0236   HDL 37* 07/18/2015 0236   CHOLHDL 8.6 07/18/2015 0236   VLDL 65* 07/18/2015 0236   LDLCALC 215* 07/18/2015 0236     Assessment & Plan:   1. Essential hypertension Uncontrolled due to being out of antihypertensives which I have refilled Low-sodium, DASH diet  2. Tobacco abuse Spent 3 minutes counseling on cessation and she is willing to work on quitting. - buPROPion (WELLBUTRIN SR) 150 MG 12 hr tablet; Take 1 tablet (150 mg total) by mouth 2 (two) times daily. For smoking cessation  Dispense: 60 tablet; Refill: 2  3. HLD (hyperlipidemia) Uncontrolled 10 year cardiovascular risk of 20.7 Continue statin and lipid panel next month We'll consider switching to high intensity statin.  4. Other specified hypothyroidism Remains on levothyroxine; last TSH was elevated - TSH  5. Soft tissue swelling of non-joint region Referred to general surgery  Full pedal edema if it persist I will consider discontinuing amlodipine and replacing it with Lisinopril/HCTZ  Meds ordered this encounter  Medications  . buPROPion (WELLBUTRIN SR) 150 MG 12 hr tablet    Sig: Take 1 tablet (150 mg total) by mouth 2 (two) times daily. For smoking cessation    Dispense:  60 tablet    Refill:  2    Follow-up:  Return in about 3 months (around 12/20/2015) for Follow-up of hyperlipidemia.   Arnoldo Morale MD

## 2015-09-19 NOTE — Patient Instructions (Signed)
Smoking Cessation, Tips for Success If you are ready to quit smoking, congratulations! You have chosen to help yourself be healthier. Cigarettes bring nicotine, tar, carbon monoxide, and other irritants into your body. Your lungs, heart, and blood vessels will be able to work better without these poisons. There are many different ways to quit smoking. Nicotine gum, nicotine patches, a nicotine inhaler, or nicotine nasal spray can help with physical craving. Hypnosis, support groups, and medicines help break the habit of smoking. WHAT THINGS CAN I DO TO MAKE QUITTING EASIER?  Here are some tips to help you quit for good:  Pick a date when you will quit smoking completely. Tell all of your friends and family about your plan to quit on that date.  Do not try to slowly cut down on the number of cigarettes you are smoking. Pick a quit date and quit smoking completely starting on that day.  Throw away all cigarettes.   Clean and remove all ashtrays from your home, work, and car.  On a card, write down your reasons for quitting. Carry the card with you and read it when you get the urge to smoke.  Cleanse your body of nicotine. Drink enough water and fluids to keep your urine clear or pale yellow. Do this after quitting to flush the nicotine from your body.  Learn to predict your moods. Do not let a bad situation be your excuse to have a cigarette. Some situations in your life might tempt you into wanting a cigarette.  Never have "just one" cigarette. It leads to wanting another and another. Remind yourself of your decision to quit.  Change habits associated with smoking. If you smoked while driving or when feeling stressed, try other activities to replace smoking. Stand up when drinking your coffee. Brush your teeth after eating. Sit in a different chair when you read the paper. Avoid alcohol while trying to quit, and try to drink fewer caffeinated beverages. Alcohol and caffeine may urge you to  smoke.  Avoid foods and drinks that can trigger a desire to smoke, such as sugary or spicy foods and alcohol.  Ask people who smoke not to smoke around you.  Have something planned to do right after eating or having a cup of coffee. For example, plan to take a walk or exercise.  Try a relaxation exercise to calm you down and decrease your stress. Remember, you may be tense and nervous for the first 2 weeks after you quit, but this will pass.  Find new activities to keep your hands busy. Play with a pen, coin, or rubber band. Doodle or draw things on paper.  Brush your teeth right after eating. This will help cut down on the craving for the taste of tobacco after meals. You can also try mouthwash.   Use oral substitutes in place of cigarettes. Try using lemon drops, carrots, cinnamon sticks, or chewing gum. Keep them handy so they are available when you have the urge to smoke.  When you have the urge to smoke, try deep breathing.  Designate your home as a nonsmoking area.  If you are a heavy smoker, ask your health care provider about a prescription for nicotine chewing gum. It can ease your withdrawal from nicotine.  Reward yourself. Set aside the cigarette money you save and buy yourself something nice.  Look for support from others. Join a support group or smoking cessation program. Ask someone at home or at work to help you with your plan   to quit smoking.  Always ask yourself, "Do I need this cigarette or is this just a reflex?" Tell yourself, "Today, I choose not to smoke," or "I do not want to smoke." You are reminding yourself of your decision to quit.  Do not replace cigarette smoking with electronic cigarettes (commonly called e-cigarettes). The safety of e-cigarettes is unknown, and some may contain harmful chemicals.  If you relapse, do not give up! Plan ahead and think about what you will do the next time you get the urge to smoke. HOW WILL I FEEL WHEN I QUIT SMOKING? You  may have symptoms of withdrawal because your body is used to nicotine (the addictive substance in cigarettes). You may crave cigarettes, be irritable, feel very hungry, cough often, get headaches, or have difficulty concentrating. The withdrawal symptoms are only temporary. They are strongest when you first quit but will go away within 10-14 days. When withdrawal symptoms occur, stay in control. Think about your reasons for quitting. Remind yourself that these are signs that your body is healing and getting used to being without cigarettes. Remember that withdrawal symptoms are easier to treat than the major diseases that smoking can cause.  Even after the withdrawal is over, expect periodic urges to smoke. However, these cravings are generally short lived and will go away whether you smoke or not. Do not smoke! WHAT RESOURCES ARE AVAILABLE TO HELP ME QUIT SMOKING? Your health care provider can direct you to community resources or hospitals for support, which may include:  Group support.  Education.  Hypnosis.  Therapy.   This information is not intended to replace advice given to you by your health care provider. Make sure you discuss any questions you have with your health care provider.   Document Released: 12/28/2003 Document Revised: 04/21/2014 Document Reviewed: 09/16/2012 Elsevier Interactive Patient Education 2016 [employer].  

## 2015-09-19 NOTE — Progress Notes (Signed)
Pt here for F/U for HTN and thyroid. Pt denies any current pain. Pt reports a growth on her left leg that has been present for a year due to a bug bite that has started to give her lots of pain. Pain described as sore. Pt reports swelling in both ankles and reports she had redness going up from her ankles to her knees. Pt is out of all medications and needs refills. Pt has been out of medications since Sunday.

## 2015-09-20 ENCOUNTER — Other Ambulatory Visit: Payer: Self-pay | Admitting: Family Medicine

## 2015-09-20 MED ORDER — LEVOTHYROXINE SODIUM 88 MCG PO TABS: 88.0000 ug | ORAL_TABLET | Freq: Every day | ORAL | Status: DC

## 2015-09-20 MED FILL — ?LEVOTHYROXINE 88 MCG TAB: 88 | 30 days supply | Qty: 30 | Fill #0

## 2015-09-21 NOTE — Progress Notes (Signed)
Left message for patient to call back  

## 2015-09-24 ENCOUNTER — Telehealth: Payer: Self-pay

## 2015-09-24 NOTE — Telephone Encounter (Signed)
Writer called patient she will schedule an appt with the financial counselors.

## 2015-09-24 NOTE — Telephone Encounter (Signed)
Writer called patient per Dr. Jarold Song regarding thyroid test result:  Notes Recorded by Arnoldo Morale, MD on 09/20/2015 at 9:39 AM TSH is suppressed and so I have sent a prescription for a reduced dose of levothyroxine to her pharmacy.  Patient stated understanding regarding thyroid medication dose change.  She will come to our pharmacy and pick up the medication tomorrow. Patient wanted to let Dr. Jarold Song know that she tried to get into the general surgeon she was referred to however could not see him due to lack of insurance.  She continues to want to get the left thigh lesion removed and would like to know if Dr. Jarold Song had any other suggestion.

## 2015-09-24 NOTE — Telephone Encounter (Signed)
I have placed the referral as I said I would; please educate her about application for the [facility] discount.

## 2015-10-18 MED FILL — ?PANTOPRAZOLE SOD DR 40MG: 40 MG | 30 days supply | Qty: 30 | Fill #0

## 2015-10-18 MED FILL — ?LEVOTHYROXINE 88 MCG TAB: 88 | 30 days supply | Qty: 30 | Fill #1

## 2015-10-18 MED FILL — ?PRAVASTATIN NA 40 MG TAB: 40 MG | 30 days supply | Qty: 30 | Fill #0

## 2015-10-18 MED FILL — BUPROPION SR 150 MG TABLET: 150 | 30 days supply | Qty: 60 | Fill #1

## 2015-10-18 MED FILL — CARVEDILOL 12.5 MG TABLET: 12.5 | 30 days supply | Qty: 60 | Fill #0

## 2015-10-18 MED FILL — ?AMLODIPINE BESYLATE 10 MG: 10 | 30 days supply | Qty: 30 | Fill #0

## 2015-11-15 MED FILL — ?PANTOPRAZOLE SOD DR 40MG: 40 MG | 30 days supply | Qty: 30 | Fill #1

## 2015-11-15 MED FILL — ?BUPROPION HCL SR 150 MG TA: 150 | 30 days supply | Qty: 60 | Fill #2

## 2015-11-15 MED FILL — ?LEVOTHYROXINE 88 MCG TAB: 88 | 30 days supply | Qty: 30 | Fill #2

## 2015-11-15 MED FILL — ?PRAVASTATIN NA 40 MG TAB: 40 MG | 30 days supply | Qty: 30 | Fill #1

## 2015-11-15 MED FILL — ?CARVEDILOL 12.5 MG TABLET: 12.5 | 30 days supply | Qty: 60 | Fill #1

## 2015-11-15 MED FILL — AMLODIPINE BESYLATE 10 MG T: 10 | 30 days supply | Qty: 30 | Fill #1

## 2015-12-19 ENCOUNTER — Other Ambulatory Visit: Payer: Self-pay | Admitting: Family Medicine

## 2015-12-19 DIAGNOSIS — Z720 Tobacco use: Secondary | ICD-10-CM

## 2015-12-19 MED FILL — ?PRAVASTATIN NA 40 MG TAB: 40 MG | 30 days supply | Qty: 30 | Fill #2

## 2015-12-19 MED FILL — ?CARVEDILOL 12.5 MG TABLET: 12.5 | 30 days supply | Qty: 60 | Fill #2

## 2015-12-20 MED FILL — ?LEVOTHYROXINE 88 MCG TAB: 88 | 30 days supply | Qty: 30 | Fill #0

## 2015-12-20 MED FILL — ?BUPROPION HCL SR 150 MG TA: 150 | 30 days supply | Qty: 60 | Fill #0

## 2015-12-21 ENCOUNTER — Encounter: Payer: Self-pay | Admitting: Family Medicine

## 2015-12-21 ENCOUNTER — Ambulatory Visit: Payer: Self-pay | Attending: Family Medicine | Admitting: Family Medicine

## 2015-12-21 VITALS — BP 136/80 | HR 65 | Temp 98.2°F | Ht 62.0 in | Wt 146.6 lb

## 2015-12-21 DIAGNOSIS — R51 Headache: Secondary | ICD-10-CM | POA: Insufficient documentation

## 2015-12-21 DIAGNOSIS — E038 Other specified hypothyroidism: Secondary | ICD-10-CM

## 2015-12-21 DIAGNOSIS — I10 Essential (primary) hypertension: Secondary | ICD-10-CM

## 2015-12-21 DIAGNOSIS — E785 Hyperlipidemia, unspecified: Secondary | ICD-10-CM

## 2015-12-21 MED ORDER — LEVOTHYROXINE SODIUM 88 MCG PO TABS: ORAL_TABLET | ORAL | 5 refills | Status: DC

## 2015-12-21 MED ORDER — PRAVASTATIN SODIUM 40 MG PO TABS: 40.0000 mg | ORAL_TABLET | Freq: Every day | ORAL | 5 refills | Status: DC

## 2015-12-21 MED ORDER — AMLODIPINE BESYLATE 10 MG PO TABS: 10.0000 mg | ORAL_TABLET | Freq: Every day | ORAL | 5 refills | Status: DC

## 2015-12-21 MED ORDER — CARVEDILOL 12.5 MG PO TABS: 12.5000 mg | ORAL_TABLET | Freq: Two times a day (BID) | ORAL | 5 refills | Status: DC

## 2015-12-21 NOTE — Progress Notes (Signed)
Subjective:  Patient ID: Danielle Berger, female    DOB: Aug 23, 1955  Age: 60 y.o. MRN: [id number]  CC: Hypertension   HPI Danielle Berger is a 60 year old female with a history of hypertension, hyperlipidemia, hypothyroidism who comes into the clinic for a follow-up visit. She has been compliant with her medications and has no complaints today. Does not exercise regularly but has been adhering to low sodium diet.  She is not up-to-date on mammogram or Pap smear. Requests refills today.  Past Medical History:  Diagnosis Date  . Headache   . Hypertension     Past Surgical History:  Procedure Laterality Date  . Brain Tumor removed      when patient was in the 6th grade  . KIDNEY SURGERY     when patient was int he 8th grade    Allergies  Allergen Reactions  . Codeine Nausea And Vomiting      Outpatient Medications Prior to Visit  Medication Sig Dispense Refill  . aspirin EC 81 MG EC tablet Take 1 tablet (81 mg total) by mouth daily.    Marland Kitchen buPROPion (WELLBUTRIN SR) 150 MG 12 hr tablet TAKE 1 TABLET BY MOUTH 2 TIMES DAILY. FOR SMOKING CESSATION 60 tablet 2  . loratadine (CLARITIN) 10 MG tablet Take 10 mg by mouth daily as needed for allergies.    Marland Kitchen amLODipine (NORVASC) 10 MG tablet Take 1 tablet (10 mg total) by mouth daily. 30 tablet 2  . carvedilol (COREG) 12.5 MG tablet Take 1 tablet (12.5 mg total) by mouth 2 (two) times daily with a meal. 60 tablet 2  . levothyroxine (SYNTHROID, LEVOTHROID) 88 MCG tablet TAKE 1 TABLET BY MOUTH DAILY BEFORE BREAKFAST. 30 tablet 2  . pravastatin (PRAVACHOL) 40 MG tablet Take 1 tablet (40 mg total) by mouth daily. 30 tablet 2  . pantoprazole (PROTONIX) 40 MG tablet Take 1 tablet (40 mg total) by mouth daily. (Patient not taking: Reported on 12/21/2015) 30 tablet 2   No facility-administered medications prior to visit.     ROS Review of Systems  Constitutional: Negative for activity change, appetite change and fatigue.  HENT: Negative  for congestion, sinus pressure and sore throat.   Eyes: Negative for visual disturbance.  Respiratory: Negative for cough, chest tightness, shortness of breath and wheezing.   Cardiovascular: Negative for chest pain and palpitations.  Gastrointestinal: Negative for abdominal distention, abdominal pain and constipation.  Endocrine: Negative for polydipsia.  Genitourinary: Negative for dysuria and frequency.  Musculoskeletal: Negative for arthralgias and back pain.  Skin: Negative for rash.  Neurological: Negative for tremors, light-headedness and numbness.  Hematological: Does not bruise/bleed easily.  Psychiatric/Behavioral: Negative for agitation and behavioral problems.    Objective:  BP 136/80 (BP Location: Right Arm, Patient Position: Sitting, Cuff Size: Small)   Pulse 65   Temp 98.2 F (36.8 C) (Oral)   Ht 5\' 2"  (1.575 m)   Wt 146 lb 9.6 oz (66.5 kg)   SpO2 97%   BMI 26.81 kg/m   BP/Weight 12/21/2015 09/19/2015 99991111  Systolic BP [id number] 0000000  Diastolic BP 80 84 78  Wt. (Lbs) 146.6 149.6 152.8  BMI 26.81 27.36 27.94      Physical Exam  Constitutional: She is oriented to person, place, and time. She appears well-developed and well-nourished.  Cardiovascular: Normal rate, normal heart sounds and intact distal pulses.   No murmur heard. Pulmonary/Chest: Effort normal and breath sounds normal. She has no wheezes. She has no rales. She exhibits  no tenderness.  Abdominal: Soft. Bowel sounds are normal. She exhibits no distension and no mass. There is no tenderness.  Musculoskeletal: Normal range of motion.  Neurological: She is alert and oriented to person, place, and time.     CMP Latest Ref Rng & Units 07/18/2015 07/17/2015 08/06/2009  Glucose 65 - 99 mg/dL 116(H) 102(H) 98  BUN 6 - 20 mg/dL 23(H) 21(H) 19  Creatinine 0.44 - 1.00 mg/dL 1.12(H) 1.15(H) 0.7  Sodium 135 - 145 mmol/L 142 141 141  Potassium 3.5 - 5.1 mmol/L 3.6 3.7 3.8  Chloride 101 - 111 mmol/L 104 100(L)  107  CO2 22 - 32 mmol/L 28 28 -  Calcium 8.9 - 10.3 mg/dL 9.7 9.7 -    Lipid Panel     Component Value Date/Time   CHOL 317 (H) 07/18/2015 0236   TRIG 324 (H) 07/18/2015 0236   HDL 37 (L) 07/18/2015 0236   CHOLHDL 8.6 07/18/2015 0236   VLDL 65 (H) 07/18/2015 0236   LDLCALC 215 (H) 07/18/2015 0236     Assessment & Plan:   1. Essential hypertension Controlled Low-sodium, DASH diet Advised on lifestyle modifications including increasing physical activity - amLODipine (NORVASC) 10 MG tablet; Take 1 tablet (10 mg total) by mouth daily.  Dispense: 30 tablet; Refill: 5 - carvedilol (COREG) 12.5 MG tablet; Take 1 tablet (12.5 mg total) by mouth 2 (two) times daily with a meal.  Dispense: 60 tablet; Refill: 5  2. HLD (hyperlipidemia) Uncontrolled We'll send a lipid panel at next visit when patient is fasting. - pravastatin (PRAVACHOL) 40 MG tablet; Take 1 tablet (40 mg total) by mouth daily.  Dispense: 30 tablet; Refill: 5  3. Other specified hypothyroidism Controlled - levothyroxine (SYNTHROID, LEVOTHROID) 88 MCG tablet; TAKE 1 TABLET BY MOUTH DAILY BEFORE BREAKFAST.  Dispense: 30 tablet; Refill: 5   Meds ordered this encounter  Medications  . pravastatin (PRAVACHOL) 40 MG tablet    Sig: Take 1 tablet (40 mg total) by mouth daily.    Dispense:  30 tablet    Refill:  5  . amLODipine (NORVASC) 10 MG tablet    Sig: Take 1 tablet (10 mg total) by mouth daily.    Dispense:  30 tablet    Refill:  5  . carvedilol (COREG) 12.5 MG tablet    Sig: Take 1 tablet (12.5 mg total) by mouth 2 (two) times daily with a meal.    Dispense:  60 tablet    Refill:  5  . levothyroxine (SYNTHROID, LEVOTHROID) 88 MCG tablet    Sig: TAKE 1 TABLET BY MOUTH DAILY BEFORE BREAKFAST.    Dispense:  30 tablet    Refill:  5    Follow-up: Return in about 1 month (around 01/20/2016) for complete physical exam.   Arnoldo Morale MD

## 2015-12-24 MED FILL — AMLODIPINE BESYLATE 10 MG T: 10 | 30 days supply | Qty: 30 | Fill #0

## 2016-01-16 ENCOUNTER — Ambulatory Visit: Payer: Self-pay

## 2016-01-16 ENCOUNTER — Ambulatory Visit: Payer: Self-pay | Attending: Family Medicine | Admitting: Family Medicine

## 2016-01-16 ENCOUNTER — Encounter: Payer: Self-pay | Admitting: Family Medicine

## 2016-01-16 DIAGNOSIS — Z0000 Encounter for general adult medical examination without abnormal findings: Secondary | ICD-10-CM

## 2016-01-16 DIAGNOSIS — Z1211 Encounter for screening for malignant neoplasm of colon: Secondary | ICD-10-CM

## 2016-01-16 DIAGNOSIS — Z1239 Encounter for other screening for malignant neoplasm of breast: Secondary | ICD-10-CM

## 2016-01-16 DIAGNOSIS — Z7982 Long term (current) use of aspirin: Secondary | ICD-10-CM | POA: Insufficient documentation

## 2016-01-16 DIAGNOSIS — Z124 Encounter for screening for malignant neoplasm of cervix: Secondary | ICD-10-CM

## 2016-01-16 DIAGNOSIS — Z79899 Other long term (current) drug therapy: Secondary | ICD-10-CM | POA: Insufficient documentation

## 2016-01-16 DIAGNOSIS — I10 Essential (primary) hypertension: Secondary | ICD-10-CM | POA: Insufficient documentation

## 2016-01-16 DIAGNOSIS — Z1231 Encounter for screening mammogram for malignant neoplasm of breast: Secondary | ICD-10-CM

## 2016-01-16 DIAGNOSIS — Z1159 Encounter for screening for other viral diseases: Secondary | ICD-10-CM

## 2016-01-16 LAB — COMPLETE METABOLIC PANEL WITH GFR
ALBUMIN: 4.4 g/dL (ref 3.6–5.1)
ALK PHOS: 110 U/L (ref 33–130)
ALT: 13 U/L (ref 6–29)
AST: 15 U/L (ref 10–35)
BUN: 18 mg/dL (ref 7–25)
CALCIUM: 9.8 mg/dL (ref 8.6–10.4)
CHLORIDE: 103 mmol/L (ref 98–110)
CO2: 21 mmol/L (ref 20–31)
Creat: 1.06 mg/dL — ABNORMAL HIGH (ref 0.50–0.99)
GFR, EST AFRICAN AMERICAN: 65 mL/min (ref >=60)
GFR, EST NON AFRICAN AMERICAN: 57 mL/min — AB (ref >=60)
Glucose, Bld: 96 mg/dL (ref 65–99)
POTASSIUM: 4.2 mmol/L (ref 3.5–5.3)
Sodium: 140 mmol/L (ref 135–146)
TOTAL PROTEIN: 7 g/dL (ref 6.1–8.1)
Total Bilirubin: 0.4 mg/dL (ref 0.2–1.2)

## 2016-01-16 LAB — LIPID PANEL
CHOL/HDL RATIO: 4.3 ratio (ref <=5.0)
CHOLESTEROL: 225 mg/dL — AB (ref 125–200)
HDL: 52 mg/dL (ref >=46)
LDL Cholesterol: 141 mg/dL — ABNORMAL HIGH (ref <130)
Triglycerides: 160 mg/dL — ABNORMAL HIGH (ref <150)
VLDL: 32 mg/dL — AB (ref <30)

## 2016-01-16 MED FILL — CARVEDILOL 12.5 MG TABLET: 12.5 | 30 days supply | Qty: 60 | Fill #0

## 2016-01-16 MED FILL — ?LEVOTHYROXINE 88 MCG TAB: 88 | 30 days supply | Qty: 30 | Fill #0

## 2016-01-16 MED FILL — PRAVASTATIN NA 40 MG TAB: 40 | 30 days supply | Qty: 30 | Fill #0

## 2016-01-16 NOTE — Patient Instructions (Signed)
Health Maintenance, Female Adopting a healthy lifestyle and getting preventive care can go a long way to promote health and wellness. Talk with your health care provider about what schedule of regular examinations is right for you. This is a good chance for you to check in with your provider about disease prevention and staying healthy. In between checkups, there are plenty of things you can do on your own. Experts have done a lot of research about which lifestyle changes and preventive measures are most likely to keep you healthy. Ask your health care provider for more information. WEIGHT AND DIET  Eat a healthy diet  Be sure to include plenty of vegetables, fruits, low-fat dairy products, and lean protein.  Do not eat a lot of foods high in solid fats, added sugars, or salt.  Get regular exercise. This is one of the most important things you can do for your health.  Most adults should exercise for at least 150 minutes each week. The exercise should increase your heart rate and make you sweat (moderate-intensity exercise).  Most adults should also do strengthening exercises at least twice a week. This is in addition to the moderate-intensity exercise.  Maintain a healthy weight  Body mass index (BMI) is a measurement that can be used to identify possible weight problems. It estimates body fat based on height and weight. Your health care provider can help determine your BMI and help you achieve or maintain a healthy weight.  For females 28 years of age and older:   A BMI below 18.5 is considered underweight.  A BMI of 18.5 to 24.9 is normal.  A BMI of 25 to 29.9 is considered overweight.  A BMI of 30 and above is considered obese.  Watch levels of cholesterol and blood lipids  You should start having your blood tested for lipids and cholesterol at 61 years of age, then have this test every 5 years.  You may need to have your cholesterol levels checked more often if:  Your lipid  or cholesterol levels are high.  You are older than 61 years of age.  You are at high risk for heart disease.  CANCER SCREENING   Lung Cancer  Lung cancer screening is recommended for adults 75-66 years old who are at high risk for lung cancer because of a history of smoking.  A yearly low-dose CT scan of the lungs is recommended for people who:  Currently smoke.  Have quit within the past 15 years.  Have at least a 30-pack-year history of smoking. A pack year is smoking an average of one pack of cigarettes a day for 1 year.  Yearly screening should continue until it has been 15 years since you quit.  Yearly screening should stop if you develop a health problem that would prevent you from having lung cancer treatment.  Breast Cancer  Practice breast self-awareness. This means understanding how your breasts normally appear and feel.  It also means doing regular breast self-exams. Let your health care provider know about any changes, no matter how small.  If you are in your 20s or 30s, you should have a clinical breast exam (CBE) by a health care provider every 1-3 years as part of a regular health exam.  If you are 25 or older, have a CBE every year. Also consider having a breast X-ray (mammogram) every year.  If you have a family history of breast cancer, talk to your health care provider about genetic screening.  If you  are at high risk for breast cancer, talk to your health care provider about having an MRI and a mammogram every year.  Breast cancer gene (BRCA) assessment is recommended for women who have family members with BRCA-related cancers. BRCA-related cancers include:  Breast.  Ovarian.  Tubal.  Peritoneal cancers.  Results of the assessment will determine the need for genetic counseling and BRCA1 and BRCA2 testing. Cervical Cancer Your health care provider may recommend that you be screened regularly for cancer of the pelvic organs (ovaries, uterus, and  vagina). This screening involves a pelvic examination, including checking for microscopic changes to the surface of your cervix (Pap test). You may be encouraged to have this screening done every 3 years, beginning at age 21.  For women ages 30-65, health care providers may recommend pelvic exams and Pap testing every 3 years, or they may recommend the Pap and pelvic exam, combined with testing for human papilloma virus (HPV), every 5 years. Some types of HPV increase your risk of cervical cancer. Testing for HPV may also be done on women of any age with unclear Pap test results.  Other health care providers may not recommend any screening for nonpregnant women who are considered low risk for pelvic cancer and who do not have symptoms. Ask your health care provider if a screening pelvic exam is right for you.  If you have had past treatment for cervical cancer or a condition that could lead to cancer, you need Pap tests and screening for cancer for at least 20 years after your treatment. If Pap tests have been discontinued, your risk factors (such as having a new sexual partner) need to be reassessed to determine if screening should resume. Some women have medical problems that increase the chance of getting cervical cancer. In these cases, your health care provider may recommend more frequent screening and Pap tests. Colorectal Cancer  This type of cancer can be detected and often prevented.  Routine colorectal cancer screening usually begins at 61 years of age and continues through 61 years of age.  Your health care provider may recommend screening at an earlier age if you have risk factors for colon cancer.  Your health care provider may also recommend using home test kits to check for hidden blood in the stool.  A small camera at the end of a tube can be used to examine your colon directly (sigmoidoscopy or colonoscopy). This is done to check for the earliest forms of colorectal  cancer.  Routine screening usually begins at age 50.  Direct examination of the colon should be repeated every 5-10 years through 61 years of age. However, you may need to be screened more often if early forms of precancerous polyps or small growths are found. Skin Cancer  Check your skin from head to toe regularly.  Tell your health care provider about any new moles or changes in moles, especially if there is a change in a mole's shape or color.  Also tell your health care provider if you have a mole that is larger than the size of a pencil eraser.  Always use sunscreen. Apply sunscreen liberally and repeatedly throughout the day.  Protect yourself by wearing long sleeves, pants, a wide-brimmed hat, and sunglasses whenever you are outside. HEART DISEASE, DIABETES, AND HIGH BLOOD PRESSURE   High blood pressure causes heart disease and increases the risk of stroke. High blood pressure is more likely to develop in:  People who have blood pressure in the high end   of the normal range (130-139/85-89 mm Hg).  People who are overweight or obese.  People who are African American.  If you are 38-23 years of age, have your blood pressure checked every 3-5 years. If you are 61 years of age or older, have your blood pressure checked every year. You should have your blood pressure measured twice--once when you are at a hospital or clinic, and once when you are not at a hospital or clinic. Record the average of the two measurements. To check your blood pressure when you are not at a hospital or clinic, you can use:  An automated blood pressure machine at a pharmacy.  A home blood pressure monitor.  If you are between 45 years and 39 years old, ask your health care provider if you should take aspirin to prevent strokes.  Have regular diabetes screenings. This involves taking a blood sample to check your fasting blood sugar level.  If you are at a normal weight and have a low risk for diabetes,  have this test once every three years after 61 years of age.  If you are overweight and have a high risk for diabetes, consider being tested at a younger age or more often. PREVENTING INFECTION  Hepatitis B  If you have a higher risk for hepatitis B, you should be screened for this virus. You are considered at high risk for hepatitis B if:  You were born in a country where hepatitis B is common. Ask your health care provider which countries are considered high risk.  Your parents were born in a high-risk country, and you have not been immunized against hepatitis B (hepatitis B vaccine).  You have HIV or AIDS.  You use needles to inject street drugs.  You live with someone who has hepatitis B.  You have had sex with someone who has hepatitis B.  You get hemodialysis treatment.  You take certain medicines for conditions, including cancer, organ transplantation, and autoimmune conditions. Hepatitis C  Blood testing is recommended for:  Everyone born from [birth year] through 1966.  Anyone with known risk factors for hepatitis C. Sexually transmitted infections (STIs)  You should be screened for sexually transmitted infections (STIs) including gonorrhea and chlamydia if:  You are sexually active and are younger than 61 years of age.  You are older than 61 years of age and your health care provider tells you that you are at risk for this type of infection.  Your sexual activity has changed since you were last screened and you are at an increased risk for chlamydia or gonorrhea. Ask your health care provider if you are at risk.  If you do not have HIV, but are at risk, it may be recommended that you take a prescription medicine daily to prevent HIV infection. This is called pre-exposure prophylaxis (PrEP). You are considered at risk if:  You are sexually active and do not regularly use condoms or know the HIV status of your partner(s).  You take drugs by injection.  You are sexually  active with a partner who has HIV. Talk with your health care provider about whether you are at high risk of being infected with HIV. If you choose to begin PrEP, you should first be tested for HIV. You should then be tested every 3 months for as long as you are taking PrEP.  PREGNANCY   If you are premenopausal and you may become pregnant, ask your health care provider about preconception counseling.  If you may  become pregnant, take 400 to 800 micrograms (mcg) of folic acid every day.  If you want to prevent pregnancy, talk to your health care provider about birth control (contraception). OSTEOPOROSIS AND MENOPAUSE   Osteoporosis is a disease in which the bones lose minerals and strength with aging. This can result in serious bone fractures. Your risk for osteoporosis can be identified using a bone density scan.  If you are 55 years of age or older, or if you are at risk for osteoporosis and fractures, ask your health care provider if you should be screened.  Ask your health care provider whether you should take a calcium or vitamin D supplement to lower your risk for osteoporosis.  Menopause may have certain physical symptoms and risks.  Hormone replacement therapy may reduce some of these symptoms and risks. Talk to your health care provider about whether hormone replacement therapy is right for you.  HOME CARE INSTRUCTIONS   Schedule regular health, dental, and eye exams.  Stay current with your immunizations.   Do not use any tobacco products including cigarettes, chewing tobacco, or electronic cigarettes.  If you are pregnant, do not drink alcohol.  If you are breastfeeding, limit how much and how often you drink alcohol.  Limit alcohol intake to no more than 1 drink per day for nonpregnant women. One drink equals 12 ounces of beer, 5 ounces of wine, or 1 ounces of hard liquor.  Do not use street drugs.  Do not share needles.  Ask your health care provider for help if  you need support or information about quitting drugs.  Tell your health care provider if you often feel depressed.  Tell your health care provider if you have ever been abused or do not feel safe at home.   This information is not intended to replace advice given to you by your health care provider. Make sure you discuss any questions you have with your health care provider.   Document Released: 10/14/2010 Document Revised: 04/21/2014 Document Reviewed: 03/02/2013 Elsevier Interactive Patient Education [employer].

## 2016-01-16 NOTE — Progress Notes (Signed)
Subjective:  Patient ID: Danielle Berger, female    DOB: 1955-12-01  Age: 60 y.o. MRN: [id number]  CC: Annual Exam and Hypertension   HPI Danielle Berger presents for a complete physical exam. She has no complaints today.  Outpatient Medications Prior to Visit  Medication Sig Dispense Refill  . amLODipine (NORVASC) 10 MG tablet Take 1 tablet (10 mg total) by mouth daily. 30 tablet 5  . aspirin EC 81 MG EC tablet Take 1 tablet (81 mg total) by mouth daily.    Marland Kitchen buPROPion (WELLBUTRIN SR) 150 MG 12 hr tablet TAKE 1 TABLET BY MOUTH 2 TIMES DAILY. FOR SMOKING CESSATION 60 tablet 2  . carvedilol (COREG) 12.5 MG tablet Take 1 tablet (12.5 mg total) by mouth 2 (two) times daily with a meal. 60 tablet 5  . levothyroxine (SYNTHROID, LEVOTHROID) 88 MCG tablet TAKE 1 TABLET BY MOUTH DAILY BEFORE BREAKFAST. 30 tablet 5  . loratadine (CLARITIN) 10 MG tablet Take 10 mg by mouth daily as needed for allergies.    . pravastatin (PRAVACHOL) 40 MG tablet Take 1 tablet (40 mg total) by mouth daily. 30 tablet 5   No facility-administered medications prior to visit.     ROS Review of Systems  Constitutional: Negative for activity change, appetite change and fatigue.  HENT: Negative for congestion, sinus pressure and sore throat.   Eyes: Negative for visual disturbance.  Respiratory: Negative for cough, chest tightness, shortness of breath and wheezing.   Cardiovascular: Negative for chest pain and palpitations.  Gastrointestinal: Negative for abdominal distention, abdominal pain and constipation.  Endocrine: Negative for polydipsia.  Genitourinary: Negative for dysuria and frequency.  Musculoskeletal: Negative for arthralgias and back pain.  Skin: Negative for rash.  Neurological: Negative for tremors, light-headedness and numbness.  Hematological: Does not bruise/bleed easily.  Psychiatric/Behavioral: Negative for agitation and behavioral problems.    Objective:  BP 115/70 (BP Location: Right  Arm, Patient Position: Sitting, Cuff Size: Large)   Pulse 60   Temp 98.4 F (36.9 C) (Oral)   Ht 5\' 2"  (1.575 m)   Wt 148 lb 12.8 oz (67.5 kg)   SpO2 96%   BMI 27.22 kg/m   BP/Weight 01/16/2016 [id number]  Systolic BP [id number]  Diastolic BP 70 80 84  Wt. (Lbs) 148.8 146.6 149.6  BMI 27.22 26.81 27.36      Physical Exam  Constitutional: She is oriented to person, place, and time. She appears well-developed and well-nourished. No distress.  HENT:  Head: Normocephalic.  Right Ear: External ear normal.  Left Ear: External ear normal.  Nose: Nose normal.  Mouth/Throat: Oropharynx is clear and moist.  Eyes: Conjunctivae and EOM are normal. Pupils are equal, round, and reactive to light.  Neck: Normal range of motion. No JVD present.  Cardiovascular: Normal rate, regular rhythm, normal heart sounds and intact distal pulses.  Exam reveals no gallop.   No murmur heard. Pulmonary/Chest: Effort normal and breath sounds normal. No respiratory distress. She has no wheezes. She has no rales. She exhibits no tenderness. Right breast exhibits no mass and no tenderness. Left breast exhibits no mass and no tenderness.  Abdominal: Soft. Bowel sounds are normal. She exhibits no distension and no mass. There is no tenderness.  Genitourinary:  Genitourinary Comments: Normal external genitalia, normal vagina, normal cervix, no CMT  Musculoskeletal: Normal range of motion. She exhibits no edema or tenderness.  Neurological: She is alert and oriented to person, place, and time. She has normal reflexes.  Skin: Skin is warm and dry. She is not diaphoretic.  Psychiatric: She has a normal mood and affect.     Assessment & Plan:   1. Routine general medical examination at a health care facility   2. Screening for cervical cancer - Cytology - PAP ([place])  3. Screening for breast cancer - Mammogram Digital Screening; Future  4. Screening for viral disease - Hepatitis C antibody  screen - HIV antibody  5. Special screening for malignant neoplasms, colon - Ambulatory referral to Gastroenterology  6. Essential hypertension - Lipid panel - COMPLETE METABOLIC PANEL WITH GFR   No orders of the defined types were placed in this encounter.   Follow-up: Return in 3 months (on 04/17/2016) for Follow-up on hypertension.   Arnoldo Morale MD

## 2016-01-17 LAB — HIV ANTIBODY (ROUTINE TESTING W REFLEX): HIV 1&2 Ab, 4th Generation: NONREACTIVE

## 2016-01-17 LAB — CYTOLOGY - PAP

## 2016-01-17 LAB — HEPATITIS C ANTIBODY: HCV AB: NEGATIVE

## 2016-01-18 ENCOUNTER — Telehealth: Payer: Self-pay

## 2016-01-18 NOTE — Telephone Encounter (Signed)
-----   Message from Arnoldo Morale, MD sent at 01/17/2016  1:45 PM EDT ----- HIV, Hep C negative; lipid has improved compared to previous labs but is still slightly elevated. Continue statin and low cholesterol diet.

## 2016-01-18 NOTE — Telephone Encounter (Signed)
Called patient with lab results per Dr. Jarold Song.  Patient stated understanding regarding staying on her statin to continue improvement with her cholesterol and triglycerides.

## 2016-01-21 ENCOUNTER — Telehealth: Payer: Self-pay

## 2016-01-21 NOTE — Telephone Encounter (Signed)
Patient aware of lab results.

## 2016-01-21 NOTE — Telephone Encounter (Signed)
-----   Message from Boykin Nearing, MD sent at 01/21/2016  8:57 AM EDT ----- Negative pap HPV negative  Repeat in 3 years

## 2016-01-23 MED FILL — AMLODIPINE BESYLATE 10 MG T: 10 | 30 days supply | Qty: 30 | Fill #1

## 2016-02-12 MED FILL — ?PRAVASTATIN NA 40 MG TAB: 40 MG | 30 days supply | Qty: 30 | Fill #1

## 2016-02-18 ENCOUNTER — Encounter: Payer: Self-pay | Admitting: Family Medicine

## 2016-02-20 MED FILL — AMLODIPINE BESYLATE 10 MG T: 10 | 30 days supply | Qty: 30 | Fill #2

## 2016-02-20 MED FILL — ?LEVOTHYROXINE 88 MCG TAB: 88 | 30 days supply | Qty: 30 | Fill #1

## 2016-02-25 MED FILL — CARVEDILOL 12.5 MG TABLET: 12.5 | 30 days supply | Qty: 60 | Fill #1

## 2016-03-13 MED FILL — ?PRAVASTATIN NA 40 MG TAB: 40 MG | 30 days supply | Qty: 30 | Fill #2

## 2016-03-21 MED FILL — AMLODIPINE BESYLATE 10 MG T: 10 | 30 days supply | Qty: 30 | Fill #3

## 2016-03-21 MED FILL — ?LEVOTHYROXINE 88 MCG TAB: 88 | 30 days supply | Qty: 30 | Fill #2

## 2016-03-27 MED FILL — CARVEDILOL 12.5 MG TABLET: 12.5 | 30 days supply | Qty: 60 | Fill #2

## 2016-04-02 ENCOUNTER — Other Ambulatory Visit: Payer: Self-pay | Admitting: Family Medicine

## 2016-04-02 DIAGNOSIS — Z1231 Encounter for screening mammogram for malignant neoplasm of breast: Secondary | ICD-10-CM

## 2016-04-04 ENCOUNTER — Ambulatory Visit
Admission: RE | Admit: 2016-04-04 | Discharge: 2016-04-04 | Disposition: A | Discharge status: Home / Self Care | Payer: No Typology Code available for payment source | Source: Ambulatory Visit | Attending: Family Medicine | Admitting: Family Medicine

## 2016-04-04 DIAGNOSIS — Z1231 Encounter for screening mammogram for malignant neoplasm of breast: Secondary | ICD-10-CM

## 2016-04-10 ENCOUNTER — Telehealth: Payer: Self-pay

## 2016-04-10 NOTE — Telephone Encounter (Signed)
-----   Message from Arnoldo Morale, MD sent at 04/10/2016  1:29 PM EST ----- Mammogram is negative for malignancy

## 2016-04-10 NOTE — Telephone Encounter (Signed)
Writer called patient per Dr. Jarold Song to discuss her mammogram results.  Patient stated understanding.

## 2016-04-11 ENCOUNTER — Ambulatory Visit: Payer: Self-pay | Attending: Family Medicine | Admitting: Family Medicine

## 2016-04-11 ENCOUNTER — Encounter: Payer: Self-pay | Admitting: Family Medicine

## 2016-04-11 VITALS — BP 108/73 | HR 66 | Temp 98.5°F | Ht 62.0 in | Wt 154.0 lb

## 2016-04-11 DIAGNOSIS — E785 Hyperlipidemia, unspecified: Secondary | ICD-10-CM | POA: Insufficient documentation

## 2016-04-11 DIAGNOSIS — R229 Localized swelling, mass and lump, unspecified: Secondary | ICD-10-CM

## 2016-04-11 DIAGNOSIS — I10 Essential (primary) hypertension: Secondary | ICD-10-CM | POA: Insufficient documentation

## 2016-04-11 DIAGNOSIS — Z7982 Long term (current) use of aspirin: Secondary | ICD-10-CM | POA: Insufficient documentation

## 2016-04-11 DIAGNOSIS — Z885 Allergy status to narcotic agent status: Secondary | ICD-10-CM | POA: Insufficient documentation

## 2016-04-11 DIAGNOSIS — E039 Hypothyroidism, unspecified: Secondary | ICD-10-CM | POA: Insufficient documentation

## 2016-04-11 MED FILL — ?PRAVASTATIN NA 40 MG TAB: 40 MG | 30 days supply | Qty: 30 | Fill #3

## 2016-04-11 NOTE — Progress Notes (Signed)
Subjective:  Patient ID: Danielle Berger, female    DOB: Aug 27, 1955  Age: 60 y.o. MRN: [id number]  CC: Hypertension and Mass (left knee- bleeding that wouldn't stop)   HPI Danielle Berger  Is a 60 year old female with a history of hypertension, hyperlipidemia, hypothyroidism Here today requesting referral to a specialist for management of her left thigh lump which has progressively increased in size and sometimes bleeds; last bleed was noticed a few days ago but has stopped now. I had referred her to a general surgeon back in 09/2015 but she never followed through with the appointment.  Past Medical History:  Diagnosis Date  . Headache   . Hypertension     Past Surgical History:  Procedure Laterality Date  . Brain Tumor removed      when patient was in the 6th grade  . KIDNEY SURGERY     when patient was int he 8th grade      Allergies  Allergen Reactions  . Codeine Nausea And Vomiting     Outpatient Medications Prior to Visit  Medication Sig Dispense Refill  . amLODipine (NORVASC) 10 MG tablet Take 1 tablet (10 mg total) by mouth daily. 30 tablet 5  . aspirin EC 81 MG EC tablet Take 1 tablet (81 mg total) by mouth daily.    . carvedilol (COREG) 12.5 MG tablet Take 1 tablet (12.5 mg total) by mouth 2 (two) times daily with a meal. 60 tablet 5  . levothyroxine (SYNTHROID, LEVOTHROID) 88 MCG tablet TAKE 1 TABLET BY MOUTH DAILY BEFORE BREAKFAST. 30 tablet 5  . loratadine (CLARITIN) 10 MG tablet Take 10 mg by mouth daily as needed for allergies.    . pravastatin (PRAVACHOL) 40 MG tablet Take 1 tablet (40 mg total) by mouth daily. 30 tablet 5  . buPROPion (WELLBUTRIN SR) 150 MG 12 hr tablet TAKE 1 TABLET BY MOUTH 2 TIMES DAILY. FOR SMOKING CESSATION (Patient not taking: Reported on 04/11/2016) 60 tablet 2   No facility-administered medications prior to visit.     ROS Review of Systems  Constitutional: Negative for activity change and appetite change.  HENT: Negative for  sinus pressure and sore throat.   Respiratory: Negative for chest tightness, shortness of breath and wheezing.   Cardiovascular: Negative for chest pain and palpitations.  Gastrointestinal: Negative for abdominal distention, abdominal pain and constipation.  Genitourinary: Negative.   Musculoskeletal: Negative.   Skin:       See hpi   Psychiatric/Behavioral: Negative for behavioral problems and dysphoric mood.    Objective:  BP 108/73 (BP Location: Right Arm, Patient Position: Sitting, Cuff Size: Small)   Pulse 66   Temp 98.5 F (36.9 C) (Oral)   Ht 5\' 2"  (1.575 m)   Wt 154 lb (69.9 kg)   SpO2 98%   BMI 28.17 kg/m   BP/Weight 04/11/2016 [id number]  Systolic BP [id number]  Diastolic BP 73 70 80  Wt. (Lbs) 154 148.8 146.6  BMI 28.17 27.22 26.81      Physical Exam  Constitutional: She is oriented to person, place, and time. She appears well-developed and well-nourished.  Cardiovascular: Normal rate, normal heart sounds and intact distal pulses.   No murmur heard. Pulmonary/Chest: Effort normal and breath sounds normal. She has no wheezes. She has no rales. She exhibits no tenderness.  Abdominal: Soft. Bowel sounds are normal. She exhibits no distension and no mass. There is no tenderness.  Musculoskeletal: Normal range of motion.  Neurological: She is alert  and oriented to person, place, and time.  Skin:  Pedunculated lesion measuring 5 x 6 cm. on anterior aspect of the left inferior thigh with darkish scab on the medial aspect of lesion.     Assessment & Plan:   1. Local superficial swelling, mass or lump Lesion is at risk for frequent trauma and bleeding Advised to use protective clothing overweight. - Ambulatory referral to General Surgery   No orders of the defined types were placed in this encounter.   Follow-up: Return in about 1 month (around 05/12/2016) for Follow-up of chronic medical conditions.   Arnoldo Morale MD

## 2016-04-18 MED FILL — ?LEVOTHYROXINE 88 MCG TAB: 88 | 30 days supply | Qty: 30 | Fill #3

## 2016-04-18 MED FILL — AMLODIPINE BESYLATE 10 MG T: 10 | 30 days supply | Qty: 30 | Fill #4

## 2016-04-23 ENCOUNTER — Ambulatory Visit: Payer: Self-pay | Admitting: General Surgery

## 2016-04-28 ENCOUNTER — Ambulatory Visit: Payer: Self-pay | Attending: Family Medicine

## 2016-04-29 MED FILL — CARVEDILOL 12.5 MG TABLET: 12.5 | 30 days supply | Qty: 60 | Fill #3

## 2016-05-01 ENCOUNTER — Ambulatory Visit: Payer: Self-pay | Admitting: Surgery

## 2016-05-06 ENCOUNTER — Encounter: Payer: Self-pay | Admitting: Surgery

## 2016-05-06 ENCOUNTER — Ambulatory Visit (INDEPENDENT_AMBULATORY_CARE_PROVIDER_SITE_OTHER): Payer: Self-pay | Admitting: Surgery

## 2016-05-06 VITALS — BP 155/90 | HR 66 | Temp 98.0°F | Ht 62.0 in | Wt 152.4 lb

## 2016-05-06 DIAGNOSIS — R229 Localized swelling, mass and lump, unspecified: Secondary | ICD-10-CM

## 2016-05-06 NOTE — Progress Notes (Signed)
05/06/2016  Reason for Visit:  Left knee mass  History of Present Illness: Danielle Berger is a 61 y.o. female who presents with a two-year history of a left knee mass. Patient reports that approximately 2 years ago, she had a insect bite above the knee on the left lower extremity that initially seemed like a very small pimple. However this grew in size and has gotten worse. His husband reports that about a year ago this looked more like a "mushroom"that appeared to have a stalk and a cap.  Now has changed since shape and appears to be more of a bulge or mass. This does cause irritation and some pain to the patient particularly if anything hits it and she's had to change his type of clothing that she wears over her leg so there is less friction. She does have occasions where there is bleeding from the mass as well as itching she reports that from time to time she may have a shooting pain that we'll go up per his left thigh and she may have sometimes pain around the top of the knee joint. She denies having any sensory or motor deficits. Denies having any fevers or chills. Denies any arthritis over the left knee.   Past Medical History: Past Medical History:  Diagnosis Date  . Chest pain 07/17/2015  . Chronic renal disease   . Esophageal reflux   . Headache   . HLD (hyperlipidemia)   . Hypertension   . Hypothyroidism   . Tobacco abuse 07/17/2015     Past Surgical History: Past Surgical History:  Procedure Laterality Date  . Brain Tumor removed      when patient was in the 6th grade  . KIDNEY SURGERY     when patient was int he 8th grade    Home Medications: Prior to Admission medications   Medication Sig Start Date End Date Taking? Authorizing Provider  amLODipine (NORVASC) 10 MG tablet Take 1 tablet (10 mg total) by mouth daily. 12/21/15  Yes Arnoldo Morale, MD  aspirin EC 81 MG EC tablet Take 1 tablet (81 mg total) by mouth daily. 07/18/15  Yes Barton Dubois, MD  buPROPion (WELLBUTRIN SR)  150 MG 12 hr tablet TAKE 1 TABLET BY MOUTH 2 TIMES DAILY. FOR SMOKING CESSATION 12/19/15  Yes Arnoldo Morale, MD  carvedilol (COREG) 12.5 MG tablet Take 1 tablet (12.5 mg total) by mouth 2 (two) times daily with a meal. 12/21/15  Yes Arnoldo Morale, MD  levothyroxine (SYNTHROID, LEVOTHROID) 88 MCG tablet TAKE 1 TABLET BY MOUTH DAILY BEFORE BREAKFAST. 12/21/15  Yes Arnoldo Morale, MD  loratadine (CLARITIN) 10 MG tablet Take 10 mg by mouth daily as needed for allergies.   Yes Historical Provider, MD  pravastatin (PRAVACHOL) 40 MG tablet Take 1 tablet (40 mg total) by mouth daily. 12/21/15  Yes Arnoldo Morale, MD    Allergies: Allergies  Allergen Reactions  . Codeine Nausea And Vomiting    Social History:  reports that she has been smoking.  She has been smoking about 0.50 packs per day. She has never used smokeless tobacco. She reports that she drinks about 1.2 oz of alcohol per week . She reports that she does not use drugs.   Family History: Family History  Problem Relation Age of Onset  . Adopted: Yes    Review of Systems: Review of Systems  Constitutional: Negative for chills and fever.  HENT: Negative for hearing loss.   Eyes: Negative for blurred vision.  Respiratory: Negative  for cough and shortness of breath.   Cardiovascular: Negative for chest pain and leg swelling.  Gastrointestinal: Negative for abdominal pain, constipation, diarrhea, heartburn, nausea and vomiting.  Genitourinary: Negative for dysuria and hematuria.  Musculoskeletal: Negative for myalgias.  Skin: Negative for rash.  Neurological: Negative for dizziness.  Psychiatric/Behavioral: Negative for depression.  All other systems reviewed and are negative.   Physical Exam BP (!) 155/90   Pulse 66   Temp 98 F (36.7 C) (Oral)   Ht 5\' 2"  (1.575 m)   Wt 69.1 kg (152 lb 6.4 oz)   BMI 27.87 kg/m  CONSTITUTIONAL: No acute distress. HEENT:  Normocephalic, atraumatic, extraocular motion intact. NECK: Trachea is midline,  and there is no jugular venous distension.  RESPIRATORY:  Lungs are clear, and breath sounds are equal bilaterally. Normal respiratory effort without pathologic use of accessory muscles. CARDIOVASCULAR: Heart is regular without murmurs, gallops, or rubs. GI: The abdomen is soft, nondistended, nontender. There were no palpable masses.  MUSCULOSKELETAL:  Normal muscle strength and tone in all four extremities.  No peripheral edema or cyanosis. SKIN: Over the left lower extremity there is a 4 x 3 x 2 cm mass superior to the knee joint. On the anterior portion of the mass there is an area of scabbing consistent with episodes of bleeding that the patient reports. The mass is tender to palpation at its base and appears to be mobile. However on palpation and manipulation the patient does report having some tenderness over the superior portion of the knee as well as a shooting pain sensation going towards the mid thigh. NEUROLOGIC:  Motor and sensation is grossly normal.  Cranial nerves are grossly intact. PSYCH:  Alert and oriented to person, place and time. Affect is normal.  Laboratory Analysis: No results found for this or any previous visit (from the past 24 hour(s)).  Imaging: No results found.  Assessment and Plan: This is a 61 y.o. female who presents with a two-year history of a left lower extremity mass that is above the knee. Due to insurance issues the patient had not been able to see anybody or arrange for surgical management until now.  The mass is mobile but given that it does cause discomfort at the knee as well as the thigh, it would be prudent to do an imaging study to make sure the mass does not encroach into any other tissues. Due to the patient's insurance issues and affordability, we'll start with an ultrasound first of the left knee to evaluate for any tissue involvement. If there is anything abnormal about the ultrasound we may decide to do an MRI of the lower extremity. However if  the mass very superficial and there is no other involvement we would proceed to scheduling her for excision of the mass in the office. The patient will be called with the results and for further plans depending on the results.  The patient understands this plan and all of her questions have been answered.    Melvyn Neth, [place]

## 2016-05-06 NOTE — Patient Instructions (Signed)
We have you scheduled for an Ultrasound on 05/07/16  At  [facility]. We will call you with the results and proceed as planned to remove the mass. Please call our office if you have any questions or concerns.

## 2016-05-07 ENCOUNTER — Encounter: Payer: Self-pay | Admitting: Family Medicine

## 2016-05-07 ENCOUNTER — Ambulatory Visit: Payer: Self-pay | Attending: Family Medicine | Admitting: Family Medicine

## 2016-05-07 ENCOUNTER — Ambulatory Visit
Admission: RE | Admit: 2016-05-07 | Discharge: 2016-05-07 | Disposition: A | Discharge status: Home / Self Care | Payer: Self-pay | Source: Ambulatory Visit | Attending: Surgery | Admitting: Surgery

## 2016-05-07 ENCOUNTER — Telehealth: Payer: Self-pay

## 2016-05-07 VITALS — BP 124/82 | HR 74 | Temp 98.2°F | Ht 62.0 in | Wt 153.0 lb

## 2016-05-07 DIAGNOSIS — E7800 Pure hypercholesterolemia, unspecified: Secondary | ICD-10-CM | POA: Insufficient documentation

## 2016-05-07 DIAGNOSIS — Z9889 Other specified postprocedural states: Secondary | ICD-10-CM | POA: Insufficient documentation

## 2016-05-07 DIAGNOSIS — R229 Localized swelling, mass and lump, unspecified: Secondary | ICD-10-CM

## 2016-05-07 DIAGNOSIS — I129 Hypertensive chronic kidney disease with stage 1 through stage 4 chronic kidney disease, or unspecified chronic kidney disease: Secondary | ICD-10-CM | POA: Insufficient documentation

## 2016-05-07 DIAGNOSIS — E038 Other specified hypothyroidism: Secondary | ICD-10-CM | POA: Insufficient documentation

## 2016-05-07 DIAGNOSIS — L988 Other specified disorders of the skin and subcutaneous tissue: Secondary | ICD-10-CM | POA: Insufficient documentation

## 2016-05-07 DIAGNOSIS — K219 Gastro-esophageal reflux disease without esophagitis: Secondary | ICD-10-CM | POA: Insufficient documentation

## 2016-05-07 DIAGNOSIS — Z7982 Long term (current) use of aspirin: Secondary | ICD-10-CM | POA: Insufficient documentation

## 2016-05-07 DIAGNOSIS — F17210 Nicotine dependence, cigarettes, uncomplicated: Secondary | ICD-10-CM | POA: Insufficient documentation

## 2016-05-07 DIAGNOSIS — Z885 Allergy status to narcotic agent status: Secondary | ICD-10-CM | POA: Insufficient documentation

## 2016-05-07 DIAGNOSIS — N189 Chronic kidney disease, unspecified: Secondary | ICD-10-CM | POA: Insufficient documentation

## 2016-05-07 DIAGNOSIS — I10 Essential (primary) hypertension: Secondary | ICD-10-CM

## 2016-05-07 LAB — COMPLETE METABOLIC PANEL WITH GFR
ALT: 13 U/L (ref 6–29)
AST: 16 U/L (ref 10–35)
Albumin: 4.4 g/dL (ref 3.6–5.1)
Alkaline Phosphatase: 122 U/L (ref 33–130)
BUN: 17 mg/dL (ref 7–25)
CHLORIDE: 104 mmol/L (ref 98–110)
CO2: 25 mmol/L (ref 20–31)
Calcium: 9.8 mg/dL (ref 8.6–10.4)
Creat: 1.11 mg/dL — ABNORMAL HIGH (ref 0.50–0.99)
GFR, Est African American: 62 mL/min (ref >=60)
GFR, Est Non African American: 54 mL/min — ABNORMAL LOW (ref >=60)
GLUCOSE: 99 mg/dL (ref 65–99)
POTASSIUM: 4.7 mmol/L (ref 3.5–5.3)
SODIUM: 141 mmol/L (ref 135–146)
TOTAL PROTEIN: 7.5 g/dL (ref 6.1–8.1)
Total Bilirubin: 0.4 mg/dL (ref 0.2–1.2)

## 2016-05-07 LAB — LIPID PANEL
CHOL/HDL RATIO: 3.7 ratio (ref <5.0)
Cholesterol: 220 mg/dL — ABNORMAL HIGH (ref <200)
HDL: 59 mg/dL (ref >50)
LDL CALC: 127 mg/dL — AB (ref <100)
TRIGLYCERIDES: 168 mg/dL — AB (ref <150)
VLDL: 34 mg/dL — AB (ref <30)

## 2016-05-07 LAB — TSH: TSH: 1.16 mIU/L

## 2016-05-07 MED ORDER — PRAVASTATIN SODIUM 40 MG PO TABS: 40.0000 mg | ORAL_TABLET | Freq: Every day | ORAL | 5 refills | Status: DC

## 2016-05-07 MED ORDER — AMLODIPINE BESYLATE 10 MG PO TABS: 10.0000 mg | ORAL_TABLET | Freq: Every day | ORAL | 5 refills | Status: DC

## 2016-05-07 MED ORDER — LEVOTHYROXINE SODIUM 88 MCG PO TABS: ORAL_TABLET | ORAL | 5 refills | Status: DC

## 2016-05-07 MED ORDER — CARVEDILOL 12.5 MG PO TABS: 12.5000 mg | ORAL_TABLET | Freq: Two times a day (BID) | ORAL | 5 refills | Status: DC

## 2016-05-07 MED FILL — AMLODIPINE BESYLATE 10 MG T: 10 | 30 days supply | Qty: 30 | Fill #0

## 2016-05-07 MED FILL — CARVEDILOL 12.5 MG TABLET: 12.5 | 30 days supply | Qty: 60 | Fill #0

## 2016-05-07 MED FILL — ?LEVOTHYROXINE 88 MCG TAB: 88 | 30 days supply | Qty: 30 | Fill #0

## 2016-05-07 NOTE — Telephone Encounter (Signed)
Patient would like for Freda Munro to call her back. I asked if I could help with anything and she said no. Please call patient and advice

## 2016-05-07 NOTE — Progress Notes (Signed)
Subjective:  Patient ID: Danielle Berger, female    DOB: Aug 27, 1955  Age: 61 y.o. MRN: [id number]  CC: Hypertension and Hyperlipidemia   HPI YAHIRA Berger is a 61 year old female with a history of hypertension, hyperlipidemia, hypothyroidism who comes into the clinic for a follow-up visit. She has been compliant with her medications and has no complaints today. Does not exercise regularly but has been adhering to low sodium, low cholesterol diet. Denies heat or cold intolerance, chest pains or shortness of breath and has no myalgias from taking her statin.  She has been taking Wellbutrin for smoking cessation however continues to smoke half a pack of cigarettes a day; craves cigarettes after her meals. She is working on cutting back.  She was seen by general surgery yesterday to evaluate a left thigh mass and is scheduled for an ultrasound today. She has no acute concerns today.  Past Medical History:  Diagnosis Date  . Chest pain 07/17/2015  . Chronic renal disease   . Esophageal reflux   . Headache   . HLD (hyperlipidemia)   . Hypertension   . Hypothyroidism   . Tobacco abuse 07/17/2015    Past Surgical History:  Procedure Laterality Date  . Brain Tumor removed      when patient was in the 6th grade  . KIDNEY SURGERY     when patient was int he 8th grade    Allergies  Allergen Reactions  . Codeine Nausea And Vomiting     Outpatient Medications Prior to Visit  Medication Sig Dispense Refill  . aspirin EC 81 MG EC tablet Take 1 tablet (81 mg total) by mouth daily.    Marland Kitchen buPROPion (WELLBUTRIN SR) 150 MG 12 hr tablet TAKE 1 TABLET BY MOUTH 2 TIMES DAILY. FOR SMOKING CESSATION 60 tablet 2  . loratadine (CLARITIN) 10 MG tablet Take 10 mg by mouth daily as needed for allergies.    Marland Kitchen amLODipine (NORVASC) 10 MG tablet Take 1 tablet (10 mg total) by mouth daily. 30 tablet 5  . carvedilol (COREG) 12.5 MG tablet Take 1 tablet (12.5 mg total) by mouth 2 (two) times daily with a  meal. 60 tablet 5  . levothyroxine (SYNTHROID, LEVOTHROID) 88 MCG tablet TAKE 1 TABLET BY MOUTH DAILY BEFORE BREAKFAST. 30 tablet 5  . pravastatin (PRAVACHOL) 40 MG tablet Take 1 tablet (40 mg total) by mouth daily. 30 tablet 5   No facility-administered medications prior to visit.     ROS Review of Systems  Constitutional: Negative for activity change, appetite change and fatigue.  HENT: Negative for congestion, sinus pressure and sore throat.   Eyes: Negative for visual disturbance.  Respiratory: Negative for cough, chest tightness, shortness of breath and wheezing.   Cardiovascular: Negative for chest pain and palpitations.  Gastrointestinal: Negative for abdominal distention, abdominal pain and constipation.  Endocrine: Negative for polydipsia.  Genitourinary: Negative for dysuria and frequency.  Musculoskeletal: Negative for arthralgias and back pain.  Skin: Negative for rash.       Skin mass  Neurological: Negative for tremors, light-headedness and numbness.  Hematological: Does not bruise/bleed easily.  Psychiatric/Behavioral: Negative for agitation and behavioral problems.    Objective:  BP 124/82 (BP Location: Right Arm, Patient Position: Sitting, Cuff Size: Large)   Pulse 74   Temp 98.2 F (36.8 C) (Oral)   Ht 5\' 2"  (1.575 m)   Wt 153 lb (69.4 kg)   SpO2 97%   BMI 27.98 kg/m   BP/Weight 05/07/2016 05/06/2016 04/11/2016  Systolic BP [id number]  Diastolic BP 82 90 73  Wt. (Lbs) 153 152.4 154  BMI 27.98 27.87 28.17      Physical Exam  Constitutional: She is oriented to person, place, and time. She appears well-developed and well-nourished.  Cardiovascular: Normal rate, normal heart sounds and intact distal pulses.   No murmur heard. Pulmonary/Chest: Effort normal and breath sounds normal. She has no wheezes. She has no rales. She exhibits no tenderness.  Abdominal: Soft. Bowel sounds are normal. She exhibits no distension and no mass. There is no tenderness.    Musculoskeletal: Normal range of motion.  Neurological: She is alert and oriented to person, place, and time.  Skin:  Left thigh mass     CMP Latest Ref Rng & Units 01/16/2016 07/18/2015 07/17/2015  Glucose 65 - 99 mg/dL 96 116(H) 102(H)  BUN 7 - 25 mg/dL 18 23(H) 21(H)  Creatinine 0.50 - 0.99 mg/dL 1.06(H) 1.12(H) 1.15(H)  Sodium 135 - 146 mmol/L 140 142 141  Potassium 3.5 - 5.3 mmol/L 4.2 3.6 3.7  Chloride 98 - 110 mmol/L 103 104 100(L)  CO2 20 - 31 mmol/L 21 28 28   Calcium 8.6 - 10.4 mg/dL 9.8 9.7 9.7  Total Protein 6.1 - 8.1 g/dL 7.0 - -  Total Bilirubin 0.2 - 1.2 mg/dL 0.4 - -  Alkaline Phos 33 - 130 U/L 110 - -  AST 10 - 35 U/L 15 - -  ALT 6 - 29 U/L 13 - -    Lipid Panel     Component Value Date/Time   CHOL 225 (H) 01/16/2016 1044   TRIG 160 (H) 01/16/2016 1044   HDL 52 01/16/2016 1044   CHOLHDL 4.3 01/16/2016 1044   VLDL 32 (H) 01/16/2016 1044   LDLCALC 141 (H) 01/16/2016 1044    Lab Results  Component Value Date   TSH 0.11 (L) 09/19/2015     Assessment & Plan:   1. Other specified hypothyroidism Uncontrolled Will adjust thyroid medication accordingly after TSH result is retrieved - levothyroxine (SYNTHROID, LEVOTHROID) 88 MCG tablet; TAKE 1 TABLET BY MOUTH DAILY BEFORE BREAKFAST.  Dispense: 30 tablet; Refill: 5 - TSH  2. Pure hypercholesterolemia Uncontrolled We'll send off lipid panel to assess for improvement Low-cholesterol diet - pravastatin (PRAVACHOL) 40 MG tablet; Take 1 tablet (40 mg total) by mouth daily.  Dispense: 30 tablet; Refill: 5 - Lipid panel  3. Essential hypertension Controlled Low-sodium diet - amLODipine (NORVASC) 10 MG tablet; Take 1 tablet (10 mg total) by mouth daily.  Dispense: 30 tablet; Refill: 5 - carvedilol (COREG) 12.5 MG tablet; Take 1 tablet (12.5 mg total) by mouth 2 (two) times daily with a meal.  Dispense: 60 tablet; Refill: 5 - COMPLETE METABOLIC PANEL WITH GFR  4. Localized superficial swelling, mass, or  lump Scheduled for ultrasound Current increasing general surgeon  5. Tobacco abuse Currently smoking half a pack a day and is working on cutting back Has been taking Wellbutrin which has not been working and so she will hold off at this time.  Meds ordered this encounter  Medications  . levothyroxine (SYNTHROID, LEVOTHROID) 88 MCG tablet    Sig: TAKE 1 TABLET BY MOUTH DAILY BEFORE BREAKFAST.    Dispense:  30 tablet    Refill:  5  . pravastatin (PRAVACHOL) 40 MG tablet    Sig: Take 1 tablet (40 mg total) by mouth daily.    Dispense:  30 tablet    Refill:  5  . amLODipine (NORVASC) 10  MG tablet    Sig: Take 1 tablet (10 mg total) by mouth daily.    Dispense:  30 tablet    Refill:  5  . carvedilol (COREG) 12.5 MG tablet    Sig: Take 1 tablet (12.5 mg total) by mouth 2 (two) times daily with a meal.    Dispense:  60 tablet    Refill:  5    Follow-up: Return in about 3 months (around 08/05/2016) for follow up on chronic medical conditions.   Arnoldo Morale MD

## 2016-05-08 ENCOUNTER — Other Ambulatory Visit: Payer: Self-pay | Admitting: Family Medicine

## 2016-05-08 ENCOUNTER — Other Ambulatory Visit: Payer: Self-pay

## 2016-05-08 MED ORDER — ATORVASTATIN CALCIUM 20 MG PO TABS: 20.0000 mg | ORAL_TABLET | Freq: Every day | ORAL | 3 refills | Status: DC

## 2016-05-08 MED FILL — ATORVASTATIN 20 MG TABLET: 20 | 30 days supply | Qty: 30 | Fill #0

## 2016-05-08 NOTE — Telephone Encounter (Signed)
Spoke with patient at this time regarding Ultrasound results. Patient scheduled for in office procedure on 05/15/16 @ 8 am with Dr.Piscoya.

## 2016-05-09 ENCOUNTER — Telehealth: Payer: Self-pay

## 2016-05-09 NOTE — Telephone Encounter (Signed)
Writer called patient and discussed lab findings.  Patient will pick up new medication .

## 2016-05-09 NOTE — Telephone Encounter (Signed)
-----   Message from Arnoldo Morale, MD sent at 05/08/2016  8:39 AM EST ----- Liver and kidney function are stable however cholesterol is unchanged from last set of labs. I have switched her medication to atorvastatin which has been since to the pharmacy. Advised on low cholesterol diet and exercise.

## 2016-05-14 DIAGNOSIS — R2242 Localized swelling, mass and lump, left lower limb: Secondary | ICD-10-CM | POA: Insufficient documentation

## 2016-05-15 ENCOUNTER — Telehealth: Payer: Self-pay

## 2016-05-15 ENCOUNTER — Encounter: Payer: Self-pay | Admitting: Surgery

## 2016-05-15 ENCOUNTER — Ambulatory Visit (INDEPENDENT_AMBULATORY_CARE_PROVIDER_SITE_OTHER): Payer: No Typology Code available for payment source | Admitting: Surgery

## 2016-05-15 DIAGNOSIS — R2242 Localized swelling, mass and lump, left lower limb: Secondary | ICD-10-CM

## 2016-05-15 MED ORDER — HYDROCODONE-ACETAMINOPHEN 10-325 MG PO TABS: 1.0000 | ORAL_TABLET | Freq: Four times a day (QID) | ORAL | 0 refills | Status: DC | PRN

## 2016-05-15 MED ORDER — PROMETHAZINE HCL 12.5 MG PO TABS: 12.5000 mg | ORAL_TABLET | Freq: Four times a day (QID) | ORAL | 0 refills | Status: DC | PRN

## 2016-05-15 MED FILL — PROMETHAZINE 12.5 MG TABLET: 12.5 | 5 days supply | Qty: 20 | Fill #0

## 2016-05-15 NOTE — Telephone Encounter (Signed)
[facility] called for specimen pick up at this time.

## 2016-05-15 NOTE — Progress Notes (Signed)
  Procedure Date:  05/15/2016  Pre-operative Diagnosis:  Left leg mass  Post-operative Diagnosis:  Left leg mass  Procedure: 1.  Excision of left leg mass 2.  Wound closure of 7 cm in layers  Surgeon:  Melvyn Neth, MD  Anesthesia:  17 ml of 1% lidocaine with epi  Estimated Blood Loss:  3 ml  Specimens:  Left leg mass  Complications:  None  Indications for Procedure:  This is a 61 y.o. female with diagnosis of a left leg mass, which does not appear to invade deeper tissues on ultrasound.  The risks of bleeding, abscess or infection, injury to surrounding structures, and need for further procedures were all discussed with the patient and she was willing to proceed.  Description of Procedure: The patient was correctly identified at bedside.  Appropriate time-outs were performed prior to procedure.  The patient's left lower thigh and knee were prepped and draped in usual sterile fashion.  Local anesthetic was infused intradermally.  A 7 cm elliptical incision was made over the mass.  Using a scalpel, the mass and surrounding subcutaneous tissue were removed entirely and intact.  Hemostats were used to control bleeding and two small vessels were tied off.  The wound had good hemostasis.  Skin flaps were created on lateral and medial edges of the wound.  The wound was then closed in two layers using 3-0 Vicryl for subdermal layer and 3-0 Nylon vertical mattress sutures for the skin.  The wound was then cleaned and dried.  Antibiotic ointment was applied over the incision and dressed with gauze and tape.  The patient tolerated the procedure well and all counts were correct at the end of the case.   Melvyn Neth, MD

## 2016-05-15 NOTE — Patient Instructions (Addendum)
We will see you back in 2 weeks.  Shower as you normally do applying soap and water to incision area once daily. Once area is thoroughly dry, please place Neosporin on incision and apply dry guaze. This needs to be changed once daily after showering.  Please call with any questions or concerns that you may have.

## 2016-05-19 NOTE — Telephone Encounter (Signed)
Spoke with Manuela Schwartz at [facility] to check status and she stated additional test were being performed and that she would fax the report as soon as it is completed.

## 2016-05-20 ENCOUNTER — Telehealth: Payer: Self-pay

## 2016-05-20 NOTE — Telephone Encounter (Signed)
Spoke with patient at this time and reviewed pathology from left knee mass. Benign and completely excised. Patient reminded of follow up appointment on 05/23/16 @ 11:00 am with Dr.Piscoya.

## 2016-05-23 ENCOUNTER — Ambulatory Visit (INDEPENDENT_AMBULATORY_CARE_PROVIDER_SITE_OTHER): Payer: No Typology Code available for payment source | Admitting: Surgery

## 2016-05-23 ENCOUNTER — Encounter: Payer: Self-pay | Admitting: Surgery

## 2016-05-23 VITALS — BP 142/77 | HR 63 | Temp 97.5°F | Ht 62.0 in | Wt 155.8 lb

## 2016-05-23 DIAGNOSIS — R2242 Localized swelling, mass and lump, left lower limb: Secondary | ICD-10-CM

## 2016-05-23 NOTE — Progress Notes (Signed)
05/23/2016  HPI: Patient is status post excision of a left leg mass superior to the knee a week ago. She presents today for postop follow-up. She denies having any worsening pain. Does report still some tightness around the knee joint due to the sutures but otherwise denies having any drainage or evidence of infection from the wound. Pain is well-controlled.  Vital signs: BP (!) 142/77   Pulse 63   Temp 97.5 F (36.4 C) (Oral)   Ht 5\' 2"  (1.575 m)   Wt 70.7 kg (155 lb 12.8 oz)   BMI 28.50 kg/m    Physical Exam: Constitutional: No acute distress Extremities: Left lower extremity incision is clean dry and intact with no evidence of infection. There is still some swelling around the area but no evidence of induration.  Assessment/Plan: 61 year old female status post left leg mass excision.  -Discussed with the patient the pathology report from her mass. She has a benign soft tissue mass. -We'll remove the 2 superior and 2 inferior sutures for a total of 4, leaving the middle 4 sutures in place which is the area where the tightness is most present. -Have instructed patient to start range of motion exercises with her left lower extremity particularly at the knee joint to prevent any stiffening of the joint. -She may go up and down stairs bend the knee as tolerated. She may start working again. -She will follow up next week for removal of the remaining sutures.   Melvyn Neth, [place]

## 2016-05-23 NOTE — Patient Instructions (Signed)
We have removed a few of the sutures today. Please continue to do the range of motion exercises that Dr.Piscoya demonstrated for you in the office today. Please see your follow up appointment listed below. Please call our office if you have any questions or concerns.

## 2016-05-28 ENCOUNTER — Ambulatory Visit: Payer: Self-pay | Admitting: Surgery

## 2016-05-30 ENCOUNTER — Encounter: Payer: Self-pay | Admitting: Surgery

## 2016-05-30 ENCOUNTER — Ambulatory Visit (INDEPENDENT_AMBULATORY_CARE_PROVIDER_SITE_OTHER): Payer: No Typology Code available for payment source | Admitting: Surgery

## 2016-05-30 VITALS — BP 144/77 | HR 67 | Temp 97.8°F | Ht 62.0 in | Wt 153.6 lb

## 2016-05-30 DIAGNOSIS — R2242 Localized swelling, mass and lump, left lower limb: Secondary | ICD-10-CM

## 2016-05-30 NOTE — Patient Instructions (Signed)
We have removed the remaining sutures today. Please call our office if you have questions or concerns.

## 2016-05-30 NOTE — Progress Notes (Signed)
05/30/2016  HPI: Patient is s/p excision of left leg mass superior to the knee on 05/15/16.  Part of her sutures were removed last week, with 4 sutures remaining in place due to some swelling and tightness.    Today, patient reports much improved swelling and range of motion of the knee.  Denies any tightness or issues with the wound.  No fevers, chills, drainage or worsening pain.  Vital signs: BP (!) 144/77   Pulse 67   Temp 97.8 F (36.6 C) (Oral)   Ht 5\' 2"  (1.575 m)   Wt 69.7 kg (153 lb 9.6 oz)   BMI 28.09 kg/m    Physical Exam: Constitutional: No acute distress Extremities:  Left lower extremity incision is clean, dry, and intact.  Much improved swelling with no further tightness around the incision.  4 sutures in place.  No drainage or purulence.  Assessment/Plan: 61 yo female s/p excision of left leg mass.  --Remove remaining sutures today. --Patient may continue activities as tolerated.  May apply Vitamin E to wound in 2 weeks to decrease scarring, but otherwise no ointments until then. --Patient may follow up on an as needed basis.  Patient knows to call if any issues or concerns, worsening pain, drainage, redness, fevers, or chills.   Melvyn Neth, [place]

## 2016-06-09 MED FILL — AMLODIPINE BESYLATE 10 MG T: 10 | 30 days supply | Qty: 30 | Fill #1

## 2016-06-09 MED FILL — ATORVASTATIN 20 MG TABLET: 20 | 30 days supply | Qty: 30 | Fill #1

## 2016-06-09 MED FILL — ?CARVEDILOL 12.5 MG TABLET: 12.5 | 30 days supply | Qty: 60 | Fill #1

## 2016-06-09 MED FILL — LEVOTHYROXINE 88 MCG TABLET: 88 | 30 days supply | Qty: 30 | Fill #1

## 2016-07-07 ENCOUNTER — Ambulatory Visit: Payer: Self-pay | Attending: Family Medicine

## 2016-07-09 MED FILL — ATORVASTATIN 20 MG TABLET: 20 | 30 days supply | Qty: 30 | Fill #2

## 2016-07-09 MED FILL — AMLODIPINE BESYLATE 10 MG T: 10 | 30 days supply | Qty: 30 | Fill #2

## 2016-07-09 MED FILL — CARVEDILOL 12.5 MG TABLET: 12.5 | 30 days supply | Qty: 60 | Fill #2

## 2016-07-09 MED FILL — LEVOTHYROXINE 88 MCG TABLET: 88 | 30 days supply | Qty: 30 | Fill #2

## 2016-08-08 ENCOUNTER — Encounter: Payer: Self-pay | Admitting: Family Medicine

## 2016-08-08 ENCOUNTER — Ambulatory Visit: Payer: Self-pay | Attending: Family Medicine | Admitting: Family Medicine

## 2016-08-08 VITALS — BP 143/73 | HR 60 | Temp 97.9°F | Ht 62.0 in | Wt 155.2 lb

## 2016-08-08 DIAGNOSIS — Z79899 Other long term (current) drug therapy: Secondary | ICD-10-CM | POA: Insufficient documentation

## 2016-08-08 DIAGNOSIS — Z7982 Long term (current) use of aspirin: Secondary | ICD-10-CM | POA: Insufficient documentation

## 2016-08-08 DIAGNOSIS — Z87891 Personal history of nicotine dependence: Secondary | ICD-10-CM | POA: Insufficient documentation

## 2016-08-08 DIAGNOSIS — I129 Hypertensive chronic kidney disease with stage 1 through stage 4 chronic kidney disease, or unspecified chronic kidney disease: Secondary | ICD-10-CM | POA: Insufficient documentation

## 2016-08-08 DIAGNOSIS — E782 Mixed hyperlipidemia: Secondary | ICD-10-CM | POA: Insufficient documentation

## 2016-08-08 DIAGNOSIS — I10 Essential (primary) hypertension: Secondary | ICD-10-CM

## 2016-08-08 DIAGNOSIS — N189 Chronic kidney disease, unspecified: Secondary | ICD-10-CM | POA: Insufficient documentation

## 2016-08-08 DIAGNOSIS — E038 Other specified hypothyroidism: Secondary | ICD-10-CM | POA: Insufficient documentation

## 2016-08-08 DIAGNOSIS — K219 Gastro-esophageal reflux disease without esophagitis: Secondary | ICD-10-CM | POA: Insufficient documentation

## 2016-08-08 MED ORDER — ATORVASTATIN CALCIUM 20 MG PO TABS: 20.0000 mg | ORAL_TABLET | Freq: Every day | ORAL | 5 refills | Status: DC

## 2016-08-08 MED ORDER — CARVEDILOL 12.5 MG PO TABS
12.5000 mg | ORAL_TABLET | Freq: Two times a day (BID) | ORAL | 5 refills | Status: DC
Start: 2016-08-08 — End: 2017-02-19

## 2016-08-08 MED ORDER — AMLODIPINE BESYLATE 10 MG PO TABS: 10.0000 mg | ORAL_TABLET | Freq: Every day | ORAL | 5 refills | Status: DC

## 2016-08-08 MED ORDER — LEVOTHYROXINE SODIUM 88 MCG PO TABS: ORAL_TABLET | ORAL | 5 refills | Status: DC

## 2016-08-08 NOTE — Patient Instructions (Signed)

## 2016-08-08 NOTE — Progress Notes (Signed)
Subjective:  Patient ID: Danielle Berger, female    DOB: 07/03/1955  Age: 61 y.o. MRN: [id number]  CC: Follow-up; Hypertension; Hypothyroidism; Hyperlipidemia; and lesion removed (left leg benign lesion)   HPI Danielle Berger is a 61 year old female with a history of tobacco abuse, hypertension, hyperlipidemia, hypothyroidism who comes into the clinic for a follow-up visit.   Placed on Wellbutrin previously to help with smoking cessation however she has not been taking it and would like this to be discontinued. She has been compliant with her other medications and has no complaints today. Does not exercise regularly but has been adhering to low sodium, low cholesterol diet. Denies heat or cold intolerance, chest pains or shortness of breath and has no myalgias from taking her statin.  She is status post excision of left upper thigh mass 2 months ago and reports doing well; lesion was benign.  Past Medical History:  Diagnosis Date  . Chest pain 07/17/2015  . Chronic renal disease   . Esophageal reflux   . Headache   . HLD (hyperlipidemia)   . Hypertension   . Hypothyroidism   . Tobacco abuse 07/17/2015    Past Surgical History:  Procedure Laterality Date  . Brain Tumor removed      when patient was in the 6th grade  . KIDNEY SURGERY     when patient was int he 8th grade    Allergies  Allergen Reactions  . Codeine Nausea And Vomiting     Outpatient Medications Prior to Visit  Medication Sig Dispense Refill  . aspirin EC 81 MG EC tablet Take 1 tablet (81 mg total) by mouth daily.    Marland Kitchen loratadine (CLARITIN) 10 MG tablet Take 10 mg by mouth daily as needed for allergies.    Marland Kitchen amLODipine (NORVASC) 10 MG tablet Take 1 tablet (10 mg total) by mouth daily. 30 tablet 5  . atorvastatin (LIPITOR) 20 MG tablet Take 1 tablet (20 mg total) by mouth daily. 30 tablet 3  . carvedilol (COREG) 12.5 MG tablet Take 1 tablet (12.5 mg total) by mouth 2 (two) times daily with a meal. 60 tablet 5   . levothyroxine (SYNTHROID, LEVOTHROID) 88 MCG tablet TAKE 1 TABLET BY MOUTH DAILY BEFORE BREAKFAST. 30 tablet 5  . buPROPion (WELLBUTRIN SR) 150 MG 12 hr tablet TAKE 1 TABLET BY MOUTH 2 TIMES DAILY. FOR SMOKING CESSATION (Patient not taking: Reported on 08/08/2016) 60 tablet 2  . promethazine (PHENERGAN) 12.5 MG tablet Take 1 tablet (12.5 mg total) by mouth every 6 (six) hours as needed for nausea or vomiting. 20 tablet 0   No facility-administered medications prior to visit.     ROS Review of Systems  Constitutional: Negative for activity change, appetite change and fatigue.  HENT: Negative for congestion, sinus pressure and sore throat.   Eyes: Negative for visual disturbance.  Respiratory: Negative for cough, chest tightness, shortness of breath and wheezing.   Cardiovascular: Negative for chest pain and palpitations.  Gastrointestinal: Negative for abdominal distention, abdominal pain and constipation.  Endocrine: Negative for polydipsia.  Genitourinary: Negative for dysuria and frequency.  Musculoskeletal: Negative for arthralgias and back pain.  Skin: Negative for rash.  Neurological: Negative for tremors, light-headedness and numbness.  Hematological: Does not bruise/bleed easily.  Psychiatric/Behavioral: Negative for agitation and behavioral problems.    Objective:  BP (!) 143/73 (BP Location: Right Arm, Patient Position: Sitting, Cuff Size: Small)   Pulse 60   Temp 97.9 F (36.6 C) (Oral)   Ht  5\' 2"  (1.575 m)   Wt 155 lb 3.2 oz (70.4 kg)   SpO2 98%   BMI 28.39 kg/m   BP/Weight 08/08/2016 2/95/2841 06/14/4399  Systolic BP 027 253 664  Diastolic BP 73 77 77  Wt. (Lbs) 155.2 153.6 155.8  BMI 28.39 28.09 28.5      Physical Exam Constitutional: She is oriented to person, place, and time. She appears well-developed and well-nourished.  Cardiovascular: Normal rate, normal heart sounds and intact distal pulses.   + murmur heard. Pulmonary/Chest: Effort normal and  breath sounds normal. She has no wheezes. She has no rales. She exhibits no tenderness.  Abdominal: Soft. Bowel sounds are normal. She exhibits no distension and no mass. There is no tenderness.  Musculoskeletal: Normal range of motion.  Neurological: She is alert and oriented to person, place, and time.  Skin:   healed surgical scar on left upper thigh Psych: Normal  CMP Latest Ref Rng & Units 05/07/2016 01/16/2016 07/18/2015  Glucose 65 - 99 mg/dL 99 96 116(H)  BUN 7 - 25 mg/dL 17 18 23(H)  Creatinine 0.50 - 0.99 mg/dL 1.11(H) 1.06(H) 1.12(H)  Sodium 135 - 146 mmol/L 141 140 142  Potassium 3.5 - 5.3 mmol/L 4.7 4.2 3.6  Chloride 98 - 110 mmol/L 104 103 104  CO2 20 - 31 mmol/L 25 21 28   Calcium 8.6 - 10.4 mg/dL 9.8 9.8 9.7  Total Protein 6.1 - 8.1 g/dL 7.5 7.0 -  Total Bilirubin 0.2 - 1.2 mg/dL 0.4 0.4 -  Alkaline Phos 33 - 130 U/L 122 110 -  AST 10 - 35 U/L 16 15 -  ALT 6 - 29 U/L 13 13 -    Lipid Panel     Component Value Date/Time   CHOL 220 (H) 05/07/2016 0946   TRIG 168 (H) 05/07/2016 0946   HDL 59 05/07/2016 0946   CHOLHDL 3.7 05/07/2016 0946   VLDL 34 (H) 05/07/2016 0946   LDLCALC 127 (H) 05/07/2016 0946    Lab Results  Component Value Date   TSH 1.16 05/07/2016      Assessment & Plan:   1. Other specified hypothyroidism Controlled - levothyroxine (SYNTHROID, LEVOTHROID) 88 MCG tablet; TAKE 1 TABLET BY MOUTH DAILY BEFORE BREAKFAST.  Dispense: 30 tablet; Refill: 5 - TSH  2. Essential hypertension Controlled Low-sodium diet - carvedilol (COREG) 12.5 MG tablet; Take 1 tablet (12.5 mg total) by mouth 2 (two) times daily with a meal.  Dispense: 60 tablet; Refill: 5 - amLODipine (NORVASC) 10 MG tablet; Take 1 tablet (10 mg total) by mouth daily.  Dispense: 30 tablet; Refill: 5  3. Mixed hyperlipidemia Uncontrolled Low cholesterol diet We'll check lipid panel at next visit. - atorvastatin (LIPITOR) 20 MG tablet; Take 1 tablet (20 mg total) by mouth daily.   Dispense: 30 tablet; Refill: 5   Meds ordered this encounter  Medications  . levothyroxine (SYNTHROID, LEVOTHROID) 88 MCG tablet    Sig: TAKE 1 TABLET BY MOUTH DAILY BEFORE BREAKFAST.    Dispense:  30 tablet    Refill:  5  . carvedilol (COREG) 12.5 MG tablet    Sig: Take 1 tablet (12.5 mg total) by mouth 2 (two) times daily with a meal.    Dispense:  60 tablet    Refill:  5  . atorvastatin (LIPITOR) 20 MG tablet    Sig: Take 1 tablet (20 mg total) by mouth daily.    Dispense:  30 tablet    Refill:  5  . amLODipine (NORVASC)  10 MG tablet    Sig: Take 1 tablet (10 mg total) by mouth daily.    Dispense:  30 tablet    Refill:  5    Follow-up: Return in about 3 months (around 11/07/2016) for Follow-up of hypertension and hyperlipidemia.   Arnoldo Morale MD

## 2016-08-09 LAB — TSH: TSH: 2.47 u[IU]/mL (ref 0.450–4.500)

## 2016-08-12 ENCOUNTER — Telehealth: Payer: Self-pay

## 2016-08-12 NOTE — Telephone Encounter (Signed)
-----   Message from Arnoldo Morale, MD sent at 08/11/2016  1:28 PM EDT ----- Please inform the patient that labs are normal. Thank you.

## 2016-08-12 NOTE — Telephone Encounter (Signed)
Writer called patient with results to her lab test.  Patient stated understanding.

## 2016-08-13 MED FILL — CARVEDILOL 12.5 MG TABLET: 12.5 | 30 days supply | Qty: 60 | Fill #0

## 2016-08-13 MED FILL — ?ATORVASTATIN 20 MG TABLET: 20 | 30 days supply | Qty: 30 | Fill #0

## 2016-08-13 MED FILL — LEVOTHYROXINE 88 MCG TABLET: 88 | 30 days supply | Qty: 30 | Fill #0

## 2016-08-13 MED FILL — ?AMLODIPINE BESYLATE 10 MG: 10 | 30 days supply | Qty: 30 | Fill #0

## 2016-08-14 ENCOUNTER — Ambulatory Visit: Payer: Self-pay | Attending: Family Medicine

## 2016-09-10 MED FILL — CARVEDILOL 12.5 MG TABLET: 12.5 | 30 days supply | Qty: 60 | Fill #1

## 2016-09-10 MED FILL — LEVOTHYROXINE 88 MCG TABLET: 88 | 30 days supply | Qty: 30 | Fill #1

## 2016-09-10 MED FILL — ?AMLODIPINE BESYLATE 10 MG: 10 | 30 days supply | Qty: 30 | Fill #1

## 2016-09-10 MED FILL — ?ATORVASTATIN 20 MG TABLET: 20 | 30 days supply | Qty: 30 | Fill #1

## 2016-10-10 MED FILL — LEVOTHYROXINE 88 MCG TABLET: 88 | 30 days supply | Qty: 30 | Fill #2

## 2016-10-10 MED FILL — CARVEDILOL 12.5 MG TABLET: 12.5 | 30 days supply | Qty: 60 | Fill #2

## 2016-10-10 MED FILL — ?ATORVASTATIN 20 MG TABLET: 20 | 30 days supply | Qty: 30 | Fill #2

## 2016-10-10 MED FILL — ?AMLODIPINE BESYLATE 10 MG: 10 | 30 days supply | Qty: 30 | Fill #2

## 2016-11-11 ENCOUNTER — Encounter: Payer: Self-pay | Admitting: Family Medicine

## 2016-11-11 ENCOUNTER — Ambulatory Visit: Payer: Self-pay | Attending: Family Medicine | Admitting: Family Medicine

## 2016-11-11 VITALS — BP 122/73 | HR 58 | Temp 98.4°F | Resp 18 | Ht 62.0 in | Wt 150.0 lb

## 2016-11-11 DIAGNOSIS — I10 Essential (primary) hypertension: Secondary | ICD-10-CM

## 2016-11-11 DIAGNOSIS — E782 Mixed hyperlipidemia: Secondary | ICD-10-CM

## 2016-11-11 DIAGNOSIS — G4709 Other insomnia: Secondary | ICD-10-CM

## 2016-11-11 DIAGNOSIS — E038 Other specified hypothyroidism: Secondary | ICD-10-CM

## 2016-11-11 MED ORDER — TRAZODONE HCL 50 MG PO TABS: 50.0000 mg | ORAL_TABLET | Freq: Every evening | ORAL | 3 refills | Status: DC | PRN

## 2016-11-11 MED FILL — ?ATORVASTATIN 20 MG TABLET: 20 | 30 days supply | Qty: 30 | Fill #3

## 2016-11-11 MED FILL — traZODone HCL 50 MG TABS: 50 | 30 days supply | Qty: 30 | Fill #0

## 2016-11-11 MED FILL — LEVOTHYROXINE 88 MCG TABLET: 88 | 30 days supply | Qty: 30 | Fill #3

## 2016-11-11 MED FILL — CARVEDILOL 12.5 MG TABLET: 12.5 | 30 days supply | Qty: 60 | Fill #3

## 2016-11-11 MED FILL — AMLODIPINE BESYLATE 10 MG T: 10 | 30 days supply | Qty: 30 | Fill #3

## 2016-11-11 NOTE — Progress Notes (Addendum)
Subjective:  Patient ID: Danielle Berger, female    DOB: 10-25-1955  Age: 61 y.o. MRN: [id number]  CC: Follow-up   HPI Danielle Berger is a 61 year old female with a history of tobacco abuse, hypertension, hyperlipidemia, hypothyroidism who comes into the clinic for a follow-up visit.   She complains of insomnia and has a hard time falling asleep and staying asleep. Denies intake of caffeinated products late into the evening.  Smokes intermittently and trial of Wellbutrin to help her quit smoking was unsuccessful.  She has been compliant with her other medications and has no complaints today. Does not exercise regularly but has been adhering to low sodium, low cholesterol diet. Denies heat or cold intolerance, chest pains or shortness of breath and has no myalgias from taking her statin.  She has no additional concerns today.  Past Medical History:  Diagnosis Date  . Chest pain 07/17/2015  . Chronic renal disease   . Esophageal reflux   . Headache   . HLD (hyperlipidemia)   . Hypertension   . Hypothyroidism   . Tobacco abuse 07/17/2015    Past Surgical History:  Procedure Laterality Date  . Brain Tumor removed      when patient was in the 6th grade  . KIDNEY SURGERY     when patient was int he 8th grade    Allergies  Allergen Reactions  . Codeine Nausea And Vomiting     Outpatient Medications Prior to Visit  Medication Sig Dispense Refill  . amLODipine (NORVASC) 10 MG tablet Take 1 tablet (10 mg total) by mouth daily. 30 tablet 5  . aspirin EC 81 MG EC tablet Take 1 tablet (81 mg total) by mouth daily.    Marland Kitchen atorvastatin (LIPITOR) 20 MG tablet Take 1 tablet (20 mg total) by mouth daily. 30 tablet 5  . carvedilol (COREG) 12.5 MG tablet Take 1 tablet (12.5 mg total) by mouth 2 (two) times daily with a meal. 60 tablet 5  . levothyroxine (SYNTHROID, LEVOTHROID) 88 MCG tablet TAKE 1 TABLET BY MOUTH DAILY BEFORE BREAKFAST. 30 tablet 5  . loratadine (CLARITIN) 10 MG tablet  Take 10 mg by mouth daily as needed for allergies.     No facility-administered medications prior to visit.     ROS Review of Systems  Constitutional: Negative for activity change, appetite change and fatigue.  HENT: Negative for congestion, sinus pressure and sore throat.   Eyes: Negative for visual disturbance.  Respiratory: Negative for cough, chest tightness, shortness of breath and wheezing.   Cardiovascular: Negative for chest pain and palpitations.  Gastrointestinal: Negative for abdominal distention, abdominal pain and constipation.  Endocrine: Negative for polydipsia.  Genitourinary: Negative for dysuria and frequency.  Musculoskeletal: Negative for arthralgias and back pain.  Skin: Negative for rash.  Neurological: Negative for tremors, light-headedness and numbness.  Hematological: Does not bruise/bleed easily.  Psychiatric/Behavioral: Positive for sleep disturbance. Negative for agitation and behavioral problems.    Objective:  BP 122/73 (BP Location: Right Arm, Cuff Size: Normal)   Pulse (!) 58   Temp 98.4 F (36.9 C) (Oral)   Resp 18   Ht _0  (1.575 m)   Wt 150 lb (68 kg)   SpO2 97%   BMI 27.44 kg/m   BP/Weight 11/11/2016 08/08/2016 11/06/3662  Systolic BP 403 474 259  Diastolic BP 73 73 77  Wt. (Lbs) 150 155.2 153.6  BMI 27.44 28.39 28.09      Physical Exam  Constitutional: She is oriented to  person, place, and time. She appears well-developed and well-nourished.  Cardiovascular: Normal rate and intact distal pulses.   Murmur heard. Pulmonary/Chest: Effort normal and breath sounds normal. She has no wheezes. She has no rales. She exhibits no tenderness.  Abdominal: Soft. Bowel sounds are normal. She exhibits no distension and no mass. There is no tenderness.  Musculoskeletal: Normal range of motion.  Neurological: She is alert and oriented to person, place, and time.  Skin: Skin is warm and dry.  Psychiatric: She has a normal mood and affect.     CMP Latest Ref Rng & Units 05/07/2016 01/16/2016 07/18/2015  Glucose 65 - 99 mg/dL 99 96 116(H)  BUN 7 - 25 mg/dL 17 18 23(H)  Creatinine 0.50 - 0.99 mg/dL 1.11(H) 1.06(H) 1.12(H)  Sodium 135 - 146 mmol/L 141 140 142  Potassium 3.5 - 5.3 mmol/L 4.7 4.2 3.6  Chloride 98 - 110 mmol/L 104 103 104  CO2 20 - 31 mmol/L _0 Calcium 8.6 - 10.4 mg/dL 9.8 9.8 9.7  Total Protein 6.1 - 8.1 g/dL 7.5 7.0 -  Total Bilirubin 0.2 - 1.2 mg/dL 0.4 0.4 -  Alkaline Phos 33 - 130 U/L 122 110 -  AST 10 - 35 U/L 16 15 -  ALT 6 - 29 U/L 13 13 -    Lipid Panel     Component Value Date/Time   CHOL 220 (H) 05/07/2016 0946   TRIG 168 (H) 05/07/2016 0946   HDL 59 05/07/2016 0946   CHOLHDL 3.7 05/07/2016 0946   VLDL 34 (H) 05/07/2016 0946   LDLCALC 127 (H) 05/07/2016 0946     Assessment & Plan:   1. Essential hypertension Controlled Continue antihypertensives Low-sodium diet - CMP14+EGFR  2. Mixed hyperlipidemia Uncontrolled We'll adjust regimen of statin after lipid panel is obtained - Lipid panel  3. Other specified hypothyroidism Controlled Continue levothyroxine - TSH  4. Other insomnia Discussed sleep hygiene Avoid caffeinated products late into the evening Moderate intensity exercise in the late afternoon could be helpful - traZODone (DESYREL) 50 MG tablet; Take 1 tablet (50 mg total) by mouth at bedtime as needed for sleep.  Dispense: 30 tablet; Refill: 3   Meds ordered this encounter  Medications  . traZODone (DESYREL) 50 MG tablet    Sig: Take 1 tablet (50 mg total) by mouth at bedtime as needed for sleep.    Dispense:  30 tablet    Refill:  3    Follow-up: Return in about 3 months (around 02/11/2017) for follow up of chronic medical conditions.   This note has been created with Surveyor, quantity. Any transcriptional errors are unintentional.     Arnoldo Morale MD

## 2016-11-11 NOTE — Patient Instructions (Signed)

## 2016-11-11 NOTE — Progress Notes (Signed)
Patient has not eaten  Patient has not taken all medication has had levothyroxine

## 2016-11-12 ENCOUNTER — Other Ambulatory Visit: Payer: Self-pay | Admitting: Family Medicine

## 2016-11-12 DIAGNOSIS — E782 Mixed hyperlipidemia: Secondary | ICD-10-CM

## 2016-11-12 LAB — CMP14+EGFR
ALT: 15 IU/L (ref 0–32)
AST: 20 IU/L (ref 0–40)
Albumin/Globulin Ratio: 1.9 (ref 1.2–2.2)
Albumin: 4.6 g/dL (ref 3.6–4.8)
Alkaline Phosphatase: 118 IU/L — ABNORMAL HIGH (ref 39–117)
BUN/Creatinine Ratio: 17 (ref 12–28)
BUN: 20 mg/dL (ref 8–27)
Bilirubin Total: 0.3 mg/dL (ref 0.0–1.2)
CO2: 23 mmol/L (ref 20–29)
Calcium: 9.5 mg/dL (ref 8.7–10.3)
Chloride: 104 mmol/L (ref 96–106)
Creatinine, Ser: 1.2 mg/dL — ABNORMAL HIGH (ref 0.57–1.00)
GFR calc Af Amer: 56 mL/min/{1.73_m2} — ABNORMAL LOW (ref >59)
GFR calc non Af Amer: 49 mL/min/{1.73_m2} — ABNORMAL LOW (ref >59)
Globulin, Total: 2.4 g/dL (ref 1.5–4.5)
Glucose: 98 mg/dL (ref 65–99)
Potassium: 4.7 mmol/L (ref 3.5–5.2)
Sodium: 143 mmol/L (ref 134–144)
Total Protein: 7 g/dL (ref 6.0–8.5)

## 2016-11-12 LAB — LIPID PANEL
CHOL/HDL RATIO: 3.8 ratio (ref 0.0–4.4)
Cholesterol, Total: 217 mg/dL — ABNORMAL HIGH (ref 100–199)
HDL: 57 mg/dL (ref >39)
LDL Calculated: 131 mg/dL — ABNORMAL HIGH (ref 0–99)
Triglycerides: 145 mg/dL (ref 0–149)
VLDL Cholesterol Cal: 29 mg/dL (ref 5–40)

## 2016-11-12 LAB — TSH: TSH: 1.97 u[IU]/mL (ref 0.450–4.500)

## 2016-11-12 MED ORDER — ATORVASTATIN CALCIUM 40 MG PO TABS: 40.0000 mg | ORAL_TABLET | Freq: Every day | ORAL | 3 refills | Status: DC

## 2016-11-12 MED FILL — ?ATORVASTATIN 40MG TABLET: 40 | 30 days supply | Qty: 30 | Fill #0

## 2016-11-28 ENCOUNTER — Telehealth: Payer: Self-pay | Admitting: *Deleted

## 2016-11-28 NOTE — Telephone Encounter (Signed)
Notes recorded by Arnoldo Morale, MD on 11/12/2016 at 2:29 PM EDT Cholesterol is elevated. I have increased the dose of atorvastatin to 40 mg daily. Please advise on low cholesterol diet and exercise.  Left message of voicemail to return call.

## 2016-12-09 MED FILL — ?CARVEDILOL 12.5 MG TABLET: 12.5 | 30 days supply | Qty: 60 | Fill #4

## 2016-12-09 MED FILL — ?ATORVASTATIN 40MG TABLET: 40 | 30 days supply | Qty: 30 | Fill #1

## 2016-12-09 MED FILL — LEVOTHYROXINE 88 MCG TABLET: 88 | 30 days supply | Qty: 30 | Fill #4

## 2016-12-09 MED FILL — traZODone HCL 50 MG TABS: 50 | 30 days supply | Qty: 30 | Fill #1

## 2017-01-12 MED FILL — LEVOTHYROXINE 88 MCG TABLET: 88 | 30 days supply | Qty: 30 | Fill #5

## 2017-01-12 MED FILL — ?ATORVASTATIN 40MG TABLET: 40 | 30 days supply | Qty: 30 | Fill #2

## 2017-01-14 MED FILL — AMLODIPINE BESYLATE 10 MG T: 10 | 30 days supply | Qty: 30 | Fill #4

## 2017-02-13 MED FILL — ?CARVEDILOL 12.5 MG TABLET: 12.5 | 30 days supply | Qty: 60 | Fill #5

## 2017-02-13 MED FILL — traZODone HCL 50 MG TABS: 50 | 30 days supply | Qty: 30 | Fill #2

## 2017-02-13 MED FILL — AMLODIPINE BESYLATE 10 MG T: 10 | 30 days supply | Qty: 30 | Fill #5

## 2017-02-13 MED FILL — ?ATORVASTATIN 40MG TABLET: 40 | 30 days supply | Qty: 30 | Fill #3

## 2017-02-19 ENCOUNTER — Encounter: Payer: Self-pay | Admitting: Family Medicine

## 2017-02-19 ENCOUNTER — Ambulatory Visit: Payer: Self-pay | Attending: Family Medicine | Admitting: Family Medicine

## 2017-02-19 VITALS — BP 147/71 | HR 56 | Temp 98.0°F | Ht 62.0 in | Wt 153.4 lb

## 2017-02-19 DIAGNOSIS — G4700 Insomnia, unspecified: Secondary | ICD-10-CM | POA: Insufficient documentation

## 2017-02-19 DIAGNOSIS — Z79899 Other long term (current) drug therapy: Secondary | ICD-10-CM | POA: Insufficient documentation

## 2017-02-19 DIAGNOSIS — E038 Other specified hypothyroidism: Secondary | ICD-10-CM

## 2017-02-19 DIAGNOSIS — E782 Mixed hyperlipidemia: Secondary | ICD-10-CM | POA: Insufficient documentation

## 2017-02-19 DIAGNOSIS — I10 Essential (primary) hypertension: Secondary | ICD-10-CM

## 2017-02-19 DIAGNOSIS — E039 Hypothyroidism, unspecified: Secondary | ICD-10-CM | POA: Insufficient documentation

## 2017-02-19 DIAGNOSIS — Z9889 Other specified postprocedural states: Secondary | ICD-10-CM | POA: Insufficient documentation

## 2017-02-19 DIAGNOSIS — Z720 Tobacco use: Secondary | ICD-10-CM

## 2017-02-19 DIAGNOSIS — G4709 Other insomnia: Secondary | ICD-10-CM

## 2017-02-19 DIAGNOSIS — I129 Hypertensive chronic kidney disease with stage 1 through stage 4 chronic kidney disease, or unspecified chronic kidney disease: Secondary | ICD-10-CM | POA: Insufficient documentation

## 2017-02-19 DIAGNOSIS — F17210 Nicotine dependence, cigarettes, uncomplicated: Secondary | ICD-10-CM | POA: Insufficient documentation

## 2017-02-19 DIAGNOSIS — Z23 Encounter for immunization: Secondary | ICD-10-CM | POA: Insufficient documentation

## 2017-02-19 DIAGNOSIS — Z1211 Encounter for screening for malignant neoplasm of colon: Secondary | ICD-10-CM

## 2017-02-19 DIAGNOSIS — Z7982 Long term (current) use of aspirin: Secondary | ICD-10-CM | POA: Insufficient documentation

## 2017-02-19 DIAGNOSIS — N189 Chronic kidney disease, unspecified: Secondary | ICD-10-CM | POA: Insufficient documentation

## 2017-02-19 MED ORDER — CARVEDILOL 12.5 MG PO TABS: 12.5000 mg | ORAL_TABLET | Freq: Two times a day (BID) | ORAL | 5 refills | Status: DC

## 2017-02-19 MED ORDER — ATORVASTATIN CALCIUM 40 MG PO TABS: 40.0000 mg | ORAL_TABLET | Freq: Every day | ORAL | 3 refills | Status: DC

## 2017-02-19 MED ORDER — AMLODIPINE BESYLATE 10 MG PO TABS: 10.0000 mg | ORAL_TABLET | Freq: Every day | ORAL | 5 refills | Status: DC

## 2017-02-19 MED ORDER — TRAZODONE HCL 50 MG PO TABS: 50.0000 mg | ORAL_TABLET | Freq: Every evening | ORAL | 3 refills | Status: DC | PRN

## 2017-02-19 NOTE — Patient Instructions (Signed)
Steps to Quit Smoking Smoking tobacco can be bad for your health. It can also affect almost every organ in your body. Smoking puts you and people around you at risk for many serious long-lasting (chronic) diseases. Quitting smoking is hard, but it is one of the best things that you can do for your health. It is never too late to quit. What are the benefits of quitting smoking? When you quit smoking, you lower your risk for getting serious diseases and conditions. They can include:  Lung cancer or lung disease.  Heart disease.  Stroke.  Heart attack.  Not being able to have children (infertility).  Weak bones (osteoporosis) and broken bones (fractures).  If you have coughing, wheezing, and shortness of breath, those symptoms may get better when you quit. You may also get sick less often. If you are pregnant, quitting smoking can help to lower your chances of having a baby of low birth weight. What can I do to help me quit smoking? Talk with your doctor about what can help you quit smoking. Some things you can do (strategies) include:  Quitting smoking totally, instead of slowly cutting back how much you smoke over a period of time.  Going to in-person counseling. You are more likely to quit if you go to many counseling sessions.  Using resources and support systems, such as: ? Online chats with a counselor. ? Phone quitlines. ? Printed self-help materials. ? Support groups or group counseling. ? Text messaging programs. ? Mobile phone apps or applications.  Taking medicines. Some of these medicines may have nicotine in them. If you are pregnant or breastfeeding, do not take any medicines to quit smoking unless your doctor says it is okay. Talk with your doctor about counseling or other things that can help you.  Talk with your doctor about using more than one strategy at the same time, such as taking medicines while you are also going to in-person counseling. This can help make  quitting easier. What things can I do to make it easier to quit? Quitting smoking might feel very hard at first, but there is a lot that you can do to make it easier. Take these steps:  Talk to your family and friends. Ask them to support and encourage you.  Call phone quitlines, reach out to support groups, or work with a counselor.  Ask people who smoke to not smoke around you.  Avoid places that make you want (trigger) to smoke, such as: ? Bars. ? Parties. ? Smoke-break areas at work.  Spend time with people who do not smoke.  Lower the stress in your life. Stress can make you want to smoke. Try these things to help your stress: ? Getting regular exercise. ? Deep-breathing exercises. ? Yoga. ? Meditating. ? Doing a body scan. To do this, close your eyes, focus on one area of your body at a time from head to toe, and notice which parts of your body are tense. Try to relax the muscles in those areas.  Download or buy apps on your mobile phone or tablet that can help you stick to your quit plan. There are many free apps, such as QuitGuide from the CDC (Centers for Disease Control and Prevention). You can find more support from smokefree.gov and other websites.  This information is not intended to replace advice given to you by your health care provider. Make sure you discuss any questions you have with your health care provider. Document Released: 01/25/2009 Document   Revised: 11/27/2015 Document Reviewed: 08/15/2014 Elsevier Interactive Patient Education  2018 [employer]vier Inc.  

## 2017-02-19 NOTE — Progress Notes (Signed)
Subjective:  Patient ID: Danielle Berger, female    DOB: January 06, 1956  Age: 61 y.o. MRN: [id number]  CC: Hypertension   HPI Danielle Berger is a 61 year old female with a history of tobacco abuse, hypertension, hyperlipidemia, hypothyroidism who comes into the clinic for a follow-up visit.   Her Lipitor had been increased from 63m to 40 mg after last lipid panel revealed elevated cholesterol and she has tolerated the new dose well denying muscle cramps and is working of a low cholesterol diet.  She has been compliant with her antihypertensive but her blood pressure is slightly elevated this morning; she admits to smoking a few minutes before coming to the clinic. She continues to smoke half a pack of cigarettes per day and has smoked for the last 46 years.  She has been compliant with levothyroxine for her Hypothyroidism.  Past Medical History:  Diagnosis Date  . Chest pain 07/17/2015  . Chronic renal disease   . Esophageal reflux   . Headache   . HLD (hyperlipidemia)   . Hypertension   . Hypothyroidism   . Tobacco abuse 07/17/2015    Past Surgical History:  Procedure Laterality Date  . Brain Tumor removed      when patient was in the 6th grade  . KIDNEY SURGERY     when patient was int he 8th grade    Allergies  Allergen Reactions  . Codeine Nausea And Vomiting     Outpatient Medications Prior to Visit  Medication Sig Dispense Refill  . aspirin EC 81 MG EC tablet Take 1 tablet (81 mg total) by mouth daily.    .Marland Kitchenlevothyroxine (SYNTHROID, LEVOTHROID) 88 MCG tablet TAKE 1 TABLET BY MOUTH DAILY BEFORE BREAKFAST. 30 tablet 5  . loratadine (CLARITIN) 10 MG tablet Take 10 mg by mouth daily as needed for allergies.    .Marland KitchenamLODipine (NORVASC) 10 MG tablet Take 1 tablet (10 mg total) by mouth daily. 30 tablet 5  . atorvastatin (LIPITOR) 40 MG tablet Take 1 tablet (40 mg total) by mouth daily. 30 tablet 3  . carvedilol (COREG) 12.5 MG tablet Take 1 tablet (12.5 mg total) by mouth 2  (two) times daily with a meal. 60 tablet 5  . traZODone (DESYREL) 50 MG tablet Take 1 tablet (50 mg total) by mouth at bedtime as needed for sleep. 30 tablet 3   No facility-administered medications prior to visit.     ROS Review of Systems  Constitutional: Negative for activity change, appetite change and fatigue.  HENT: Negative for congestion, sinus pressure and sore throat.   Eyes: Negative for visual disturbance.  Respiratory: Negative for cough, chest tightness, shortness of breath and wheezing.   Cardiovascular: Negative for chest pain and palpitations.  Gastrointestinal: Negative for abdominal distention, abdominal pain and constipation.  Endocrine: Negative for polydipsia.  Genitourinary: Negative for dysuria and frequency.  Musculoskeletal: Negative for arthralgias and back pain.  Skin: Negative for rash.  Neurological: Negative for tremors, light-headedness and numbness.  Hematological: Does not bruise/bleed easily.  Psychiatric/Behavioral: Negative for agitation and behavioral problems.    Objective:  BP (!) 147/71   Pulse (!) 56   Temp 98 F (36.7 C) (Oral)   Ht '5\' 2"'  (1.575 m)   Wt 153 lb 6.4 oz (69.6 kg)   SpO2 98%   BMI 28.06 kg/m   BP/Weight 02/19/2017 11/11/2016 41/05/7251 Systolic BP [id number] Diastolic BP 71 73 73  Wt. (Lbs) 153.4 150 155.2  BMI 28.06  27.44 28.39      Physical Exam  Constitutional: She is oriented to person, place, and time. She appears well-developed and well-nourished.  Cardiovascular: Normal rate, normal heart sounds and intact distal pulses.  No murmur heard. Pulmonary/Chest: Effort normal and breath sounds normal. She has no wheezes. She has no rales. She exhibits no tenderness.  Abdominal: Soft. Bowel sounds are normal. She exhibits no distension and no mass. There is no tenderness.  Musculoskeletal: Normal range of motion.  Neurological: She is alert and oriented to person, place, and time.  Skin: Skin is warm and dry.    Psychiatric: She has a normal mood and affect.     Lipid Panel     Component Value Date/Time   CHOL 217 (H) 11/11/2016 1004   TRIG 145 11/11/2016 1004   HDL 57 11/11/2016 1004   CHOLHDL 3.8 11/11/2016 1004   CHOLHDL 3.7 05/07/2016 0946   VLDL 34 (H) 05/07/2016 0946   LDLCALC 131 (H) 11/11/2016 1004    The 10-year ASCVD risk score Mikey Bussing DC Jr., et al., 2013) is: 14.3%   Values used to calculate the score:     Age: 89 years     Sex: Female     Is Non-Hispanic African American: No     Diabetic: No     Tobacco smoker: Yes     Systolic Blood Pressure: 381 mmHg     Is BP treated: Yes     HDL Cholesterol: 57 mg/dL     Total Cholesterol: 217 mg/dL   CMP Latest Ref Rng & Units 11/11/2016 05/07/2016 01/16/2016  Glucose 65 - 99 mg/dL 98 99 96  BUN 8 - 27 mg/dL '20 17 18  ' Creatinine 0.57 - 1.00 mg/dL 1.20(H) 1.11(H) 1.06(H)  Sodium 134 - 144 mmol/L 143 141 140  Potassium 3.5 - 5.2 mmol/L 4.7 4.7 4.2  Chloride 96 - 106 mmol/L 104 104 103  CO2 20 - 29 mmol/L '23 25 21  ' Calcium 8.7 - 10.3 mg/dL 9.5 9.8 9.8  Total Protein 6.0 - 8.5 g/dL 7.0 7.5 7.0  Total Bilirubin 0.0 - 1.2 mg/dL 0.3 0.4 0.4  Alkaline Phos 39 - 117 IU/L 118(H) 122 110  AST 0 - 40 IU/L '20 16 15  ' ALT 0 - 32 IU/L '15 13 13     ' Assessment & Plan:   1. Essential hypertension Slightly elevated above goal of 130/80 Elevation could be due to recent tobacco use No regimen changes today Adhere to a low sodium, DASH diet - amLODipine (NORVASC) 10 MG tablet; Take 1 tablet (10 mg total) daily by mouth.  Dispense: 30 tablet; Refill: 5 - carvedilol (COREG) 12.5 MG tablet; Take 1 tablet (12.5 mg total) 2 (two) times daily with a meal by mouth.  Dispense: 60 tablet; Refill: 5 - CMP14+EGFR - Lipid panel  2. Mixed hyperlipidemia Uncontrolled, 10 yr ASCVD risk of 14.3% Lipitor dose had been increased after last set of labs Low cholesterol diet. - atorvastatin (LIPITOR) 40 MG tablet; Take 1 tablet (40 mg total) daily by mouth.   Dispense: 30 tablet; Refill: 3  3. Other insomnia Controlled - traZODone (DESYREL) 50 MG tablet; Take 1 tablet (50 mg total) at bedtime as needed by mouth for sleep.  Dispense: 30 tablet; Refill: 3  4. Tobacco use 46 pack year Spent 3 minutes counseling on cessation and she is not ready to quit - CT CHEST LUNG CA SCREEN LOW DOSE W/O CM; Future  5. Other specified hypothyroidism Controlled Will refill Levothyroxine  after thyroid labs are obtained - T4, free - TSH  6. Screening for colon cancer Not ready for colonoscopy We have run out of Cologuard cards  7. Need for influenza vaccination - Flu Vaccine QUAD 36+ mos IM   Meds ordered this encounter  Medications  . amLODipine (NORVASC) 10 MG tablet    Sig: Take 1 tablet (10 mg total) daily by mouth.    Dispense:  30 tablet    Refill:  5  . carvedilol (COREG) 12.5 MG tablet    Sig: Take 1 tablet (12.5 mg total) 2 (two) times daily with a meal by mouth.    Dispense:  60 tablet    Refill:  5  . atorvastatin (LIPITOR) 40 MG tablet    Sig: Take 1 tablet (40 mg total) daily by mouth.    Dispense:  30 tablet    Refill:  3  . traZODone (DESYREL) 50 MG tablet    Sig: Take 1 tablet (50 mg total) at bedtime as needed by mouth for sleep.    Dispense:  30 tablet    Refill:  3    Follow-up: Return in about 3 months (around 05/22/2017) for follow up of Hypertension, hypothyroidism and hyperlipidemia.   Arnoldo Morale MD

## 2017-02-20 ENCOUNTER — Other Ambulatory Visit: Payer: Self-pay | Admitting: Family Medicine

## 2017-02-20 ENCOUNTER — Telehealth: Payer: Self-pay

## 2017-02-20 DIAGNOSIS — E038 Other specified hypothyroidism: Secondary | ICD-10-CM

## 2017-02-20 LAB — LIPID PANEL
CHOLESTEROL TOTAL: 195 mg/dL (ref 100–199)
Chol/HDL Ratio: 3.6 ratio (ref 0.0–4.4)
HDL: 54 mg/dL (ref >39)
LDL CALC: 106 mg/dL — AB (ref 0–99)
TRIGLYCERIDES: 173 mg/dL — AB (ref 0–149)
VLDL Cholesterol Cal: 35 mg/dL (ref 5–40)

## 2017-02-20 LAB — CMP14+EGFR
ALK PHOS: 133 IU/L — AB (ref 39–117)
ALT: 18 IU/L (ref 0–32)
AST: 16 IU/L (ref 0–40)
Albumin/Globulin Ratio: 1.9 (ref 1.2–2.2)
Albumin: 4.8 g/dL (ref 3.6–4.8)
BUN/Creatinine Ratio: 14 (ref 12–28)
BUN: 15 mg/dL (ref 8–27)
Bilirubin Total: 0.3 mg/dL (ref 0.0–1.2)
CALCIUM: 9.3 mg/dL (ref 8.7–10.3)
CO2: 24 mmol/L (ref 20–29)
CREATININE: 1.04 mg/dL — AB (ref 0.57–1.00)
Chloride: 103 mmol/L (ref 96–106)
GFR calc Af Amer: 67 mL/min/{1.73_m2} (ref >59)
GFR calc non Af Amer: 58 mL/min/{1.73_m2} — ABNORMAL LOW (ref >59)
GLUCOSE: 107 mg/dL — AB (ref 65–99)
Globulin, Total: 2.5 g/dL (ref 1.5–4.5)
Potassium: 4.7 mmol/L (ref 3.5–5.2)
SODIUM: 144 mmol/L (ref 134–144)
Total Protein: 7.3 g/dL (ref 6.0–8.5)

## 2017-02-20 LAB — T4, FREE: Free T4: 1.55 ng/dL (ref 0.82–1.77)

## 2017-02-20 LAB — TSH: TSH: 1.19 u[IU]/mL (ref 0.450–4.500)

## 2017-02-20 MED ORDER — LEVOTHYROXINE SODIUM 88 MCG PO TABS: ORAL_TABLET | ORAL | 5 refills | Status: DC

## 2017-02-20 MED FILL — LEVOTHYROXINE 88 MCG TABLET: 88 | 30 days supply | Qty: 30 | Fill #0

## 2017-02-20 NOTE — Telephone Encounter (Signed)
Pt was called and informed of lab results via voicemail.

## 2017-02-23 ENCOUNTER — Telehealth: Payer: Self-pay | Admitting: Family Medicine

## 2017-02-23 NOTE — Telephone Encounter (Signed)
Call  

## 2017-02-26 ENCOUNTER — Ambulatory Visit (HOSPITAL_COMMUNITY): Payer: Self-pay

## 2017-03-04 ENCOUNTER — Ambulatory Visit: Payer: Self-pay | Attending: Family Medicine

## 2017-03-09 ENCOUNTER — Ambulatory Visit (HOSPITAL_COMMUNITY)
Admission: RE | Admit: 2017-03-09 | Discharge: 2017-03-09 | Disposition: A | Discharge status: Home / Self Care | Payer: Self-pay | Source: Ambulatory Visit | Attending: Family Medicine | Admitting: Family Medicine

## 2017-03-09 ENCOUNTER — Encounter (HOSPITAL_COMMUNITY): Payer: Self-pay

## 2017-03-09 ENCOUNTER — Telehealth: Payer: Self-pay

## 2017-03-09 DIAGNOSIS — I2510 Atherosclerotic heart disease of native coronary artery without angina pectoris: Secondary | ICD-10-CM | POA: Insufficient documentation

## 2017-03-09 DIAGNOSIS — F17210 Nicotine dependence, cigarettes, uncomplicated: Secondary | ICD-10-CM | POA: Insufficient documentation

## 2017-03-09 DIAGNOSIS — I700 Atherosclerosis of aorta: Secondary | ICD-10-CM | POA: Insufficient documentation

## 2017-03-09 DIAGNOSIS — J432 Centrilobular emphysema: Secondary | ICD-10-CM | POA: Insufficient documentation

## 2017-03-09 DIAGNOSIS — Z720 Tobacco use: Secondary | ICD-10-CM

## 2017-03-09 DIAGNOSIS — Z122 Encounter for screening for malignant neoplasm of respiratory organs: Secondary | ICD-10-CM | POA: Insufficient documentation

## 2017-03-09 NOTE — Telephone Encounter (Signed)
Pt was called and a VM was left informing pt of lab results. 

## 2017-03-16 MED FILL — CARVEDILOL 12.5 MG TABLET: 12.5 | 30 days supply | Qty: 60 | Fill #0

## 2017-03-16 MED FILL — LEVOTHYROXINE 88 MCG TABLET: 88 | 30 days supply | Qty: 30 | Fill #1

## 2017-03-16 MED FILL — ?ATORVASTATIN 40MG TABLET: 40 | 30 days supply | Qty: 30 | Fill #0

## 2017-03-18 MED FILL — AMLODIPINE BESYLATE 10 MG T: 10 | 30 days supply | Qty: 30 | Fill #0

## 2017-04-15 MED FILL — AMLODIPINE BESYLATE 10 MG T: 10 | 30 days supply | Qty: 30 | Fill #1

## 2017-04-15 MED FILL — ?CARVEDILOL 12.5 MG TABLET: 12.5 | 30 days supply | Qty: 60 | Fill #1

## 2017-04-15 MED FILL — ?ATORVASTATIN 40MG TABLET: 40 | 30 days supply | Qty: 30 | Fill #1

## 2017-04-15 MED FILL — traZODone HCL 50 MG TABS: 50 | 30 days supply | Qty: 30 | Fill #3

## 2017-04-15 MED FILL — LEVOTHYROXINE 88 MCG TABLET: 88 | 30 days supply | Qty: 30 | Fill #2

## 2017-05-14 MED FILL — LEVOTHYROXINE 88 MCG TABLET: 88 | 30 days supply | Qty: 30 | Fill #3

## 2017-05-14 MED FILL — ?CARVEDILOL 12.5 MG TABLET: 12.5 | 30 days supply | Qty: 60 | Fill #2

## 2017-05-14 MED FILL — AMLODIPINE BESYLATE 10 MG T: 10 | 30 days supply | Qty: 30 | Fill #2

## 2017-05-14 MED FILL — ?ATORVASTATIN 40MG TABLET: 40 | 30 days supply | Qty: 30 | Fill #2

## 2017-05-25 ENCOUNTER — Ambulatory Visit: Payer: Self-pay | Attending: Family Medicine | Admitting: Family Medicine

## 2017-05-25 ENCOUNTER — Encounter: Payer: Self-pay | Admitting: Family Medicine

## 2017-05-25 VITALS — BP 134/72 | HR 58 | Temp 97.9°F | Ht 62.0 in | Wt 157.6 lb

## 2017-05-25 DIAGNOSIS — G4709 Other insomnia: Secondary | ICD-10-CM

## 2017-05-25 DIAGNOSIS — N189 Chronic kidney disease, unspecified: Secondary | ICD-10-CM | POA: Insufficient documentation

## 2017-05-25 DIAGNOSIS — Z7982 Long term (current) use of aspirin: Secondary | ICD-10-CM | POA: Insufficient documentation

## 2017-05-25 DIAGNOSIS — I10 Essential (primary) hypertension: Secondary | ICD-10-CM

## 2017-05-25 DIAGNOSIS — E038 Other specified hypothyroidism: Secondary | ICD-10-CM | POA: Insufficient documentation

## 2017-05-25 DIAGNOSIS — G4700 Insomnia, unspecified: Secondary | ICD-10-CM | POA: Insufficient documentation

## 2017-05-25 DIAGNOSIS — Z79899 Other long term (current) drug therapy: Secondary | ICD-10-CM | POA: Insufficient documentation

## 2017-05-25 DIAGNOSIS — I129 Hypertensive chronic kidney disease with stage 1 through stage 4 chronic kidney disease, or unspecified chronic kidney disease: Secondary | ICD-10-CM | POA: Insufficient documentation

## 2017-05-25 DIAGNOSIS — E782 Mixed hyperlipidemia: Secondary | ICD-10-CM | POA: Insufficient documentation

## 2017-05-25 DIAGNOSIS — Z885 Allergy status to narcotic agent status: Secondary | ICD-10-CM | POA: Insufficient documentation

## 2017-05-25 DIAGNOSIS — Z23 Encounter for immunization: Secondary | ICD-10-CM

## 2017-05-25 MED ORDER — AMLODIPINE BESYLATE 10 MG PO TABS: 10.0000 mg | ORAL_TABLET | Freq: Every day | ORAL | 5 refills | Status: DC

## 2017-05-25 MED ORDER — TRAZODONE HCL 50 MG PO TABS: 50.0000 mg | ORAL_TABLET | Freq: Every evening | ORAL | 5 refills | Status: DC | PRN

## 2017-05-25 MED ORDER — CARVEDILOL 12.5 MG PO TABS: 12.5000 mg | ORAL_TABLET | Freq: Two times a day (BID) | ORAL | 5 refills | Status: DC

## 2017-05-25 MED ORDER — LEVOTHYROXINE SODIUM 88 MCG PO TABS: ORAL_TABLET | ORAL | 5 refills | Status: DC

## 2017-05-25 MED ORDER — TETANUS-DIPHTH-ACELL PERTUSSIS 5-2.5-18.5 LF-MCG/0.5 IM SUSP: 0.5000 mL | Freq: Once | INTRAMUSCULAR | 0 refills | Status: AC

## 2017-05-25 MED ORDER — ATORVASTATIN CALCIUM 40 MG PO TABS: 40.0000 mg | ORAL_TABLET | Freq: Every day | ORAL | 5 refills | Status: DC

## 2017-05-25 NOTE — Progress Notes (Signed)
Subjective:  Patient ID: Danielle Berger, female    DOB: 03/01/1956  Age: 62 y.o. MRN: [id number]  CC: Hypertension and Hypothyroidism   HPI Danielle Berger is a 62 year old female with a history of tobacco abuse, hypertension, hyperlipidemia, hypothyroidism who comes into the clinic for a follow-up visit.   She quit smoking 3 months ago and is doing well currently with a weight gain of 4 pounds since she quit.  Her blood pressure is controlled and she endorses compliance with her antihypertensive and also tolerates her statin with no complaints of myalgias or adverse effects. She has been compliant with levothyroxine as well. With regards to exercise she has not been compliant with this but this is willing to work on being more active.  She denies additional concerns today.  Past Medical History:  Diagnosis Date  . Chest pain 07/17/2015  . Chronic renal disease   . Esophageal reflux   . Headache   . HLD (hyperlipidemia)   . Hypertension   . Hypothyroidism   . Tobacco abuse 07/17/2015    Past Surgical History:  Procedure Laterality Date  . Brain Tumor removed      when patient was in the 6th grade  . KIDNEY SURGERY     when patient was int he 8th grade    Allergies  Allergen Reactions  . Codeine Nausea And Vomiting     Outpatient Medications Prior to Visit  Medication Sig Dispense Refill  . aspirin EC 81 MG EC tablet Take 1 tablet (81 mg total) by mouth daily.    Marland Kitchen loratadine (CLARITIN) 10 MG tablet Take 10 mg by mouth daily as needed for allergies.    Marland Kitchen amLODipine (NORVASC) 10 MG tablet Take 1 tablet (10 mg total) daily by mouth. 30 tablet 5  . atorvastatin (LIPITOR) 40 MG tablet Take 1 tablet (40 mg total) daily by mouth. 30 tablet 3  . carvedilol (COREG) 12.5 MG tablet Take 1 tablet (12.5 mg total) 2 (two) times daily with a meal by mouth. 60 tablet 5  . levothyroxine (SYNTHROID, LEVOTHROID) 88 MCG tablet TAKE 1 TABLET BY MOUTH DAILY BEFORE BREAKFAST. 30 tablet 5    . traZODone (DESYREL) 50 MG tablet Take 1 tablet (50 mg total) at bedtime as needed by mouth for sleep. 30 tablet 3   No facility-administered medications prior to visit.     ROS Review of Systems  Constitutional: Negative for activity change, appetite change and fatigue.  HENT: Negative for congestion, sinus pressure and sore throat.   Eyes: Negative for visual disturbance.  Respiratory: Negative for cough, chest tightness, shortness of breath and wheezing.   Cardiovascular: Negative for chest pain and palpitations.  Gastrointestinal: Negative for abdominal distention, abdominal pain and constipation.  Endocrine: Negative for polydipsia.  Genitourinary: Negative for dysuria and frequency.  Musculoskeletal: Negative for arthralgias and back pain.  Skin: Negative for rash.  Neurological: Negative for tremors, light-headedness and numbness.  Hematological: Does not bruise/bleed easily.  Psychiatric/Behavioral: Negative for agitation and behavioral problems.    Objective:  BP 134/72   Pulse (!) 58   Temp 97.9 F (36.6 C) (Oral)   Ht 5\' 2"  (1.575 m)   Wt 157 lb 9.6 oz (71.5 kg)   SpO2 100%   BMI 28.83 kg/m   BP/Weight 05/25/2017 02/19/2017 9/47/0962  Systolic BP 836 629 476  Diastolic BP 72 71 73  Wt. (Lbs) 157.6 153.4 150  BMI 28.83 28.06 27.44     Physical Exam  Constitutional:  She is oriented to person, place, and time. She appears well-developed and well-nourished.  Cardiovascular: Normal rate and intact distal pulses.  No murmur heard. Pulmonary/Chest: Effort normal and breath sounds normal. She has no wheezes. She has no rales. She exhibits no tenderness.  Abdominal: Soft. Bowel sounds are normal. She exhibits no distension and no mass. There is no tenderness.  Musculoskeletal: Normal range of motion.  Neurological: She is alert and oriented to person, place, and time.     CMP Latest Ref Rng & Units 02/19/2017 11/11/2016 05/07/2016  Glucose 65 - 99 mg/dL 107(H) 98  99  BUN 8 - 27 mg/dL 15 20 17   Creatinine 0.57 - 1.00 mg/dL 1.04(H) 1.20(H) 1.11(H)  Sodium 134 - 144 mmol/L 144 143 141  Potassium 3.5 - 5.2 mmol/L 4.7 4.7 4.7  Chloride 96 - 106 mmol/L 103 104 104  CO2 20 - 29 mmol/L 24 23 25   Calcium 8.7 - 10.3 mg/dL 9.3 9.5 9.8  Total Protein 6.0 - 8.5 g/dL 7.3 7.0 7.5  Total Bilirubin 0.0 - 1.2 mg/dL 0.3 0.3 0.4  Alkaline Phos 39 - 117 IU/L 133(H) 118(H) 122  AST 0 - 40 IU/L 16 20 16   ALT 0 - 32 IU/L 18 15 13     Lipid Panel     Component Value Date/Time   CHOL 195 02/19/2017 1007   TRIG 173 (H) 02/19/2017 1007   HDL 54 02/19/2017 1007   CHOLHDL 3.6 02/19/2017 1007   CHOLHDL 3.7 05/07/2016 0946   VLDL 34 (H) 05/07/2016 0946   LDLCALC 106 (H) 02/19/2017 1007      Assessment & Plan:   1. Essential hypertension Controlled Counseled on blood pressure goal of less than 130/80, low-sodium, DASH diet, medication compliance, 150 minutes of moderate intensity exercise per week. Discussed medication compliance, adverse effects. - amLODipine (NORVASC) 10 MG tablet; Take 1 tablet (10 mg total) by mouth daily.  Dispense: 30 tablet; Refill: 5 - carvedilol (COREG) 12.5 MG tablet; Take 1 tablet (12.5 mg total) by mouth 2 (two) times daily with a meal.  Dispense: 60 tablet; Refill: 5  2. Mixed hyperlipidemia Improved Low-cholesterol diet - atorvastatin (LIPITOR) 40 MG tablet; Take 1 tablet (40 mg total) by mouth daily.  Dispense: 30 tablet; Refill: 5  3. Other specified hypothyroidism Controlled - levothyroxine (SYNTHROID, LEVOTHROID) 88 MCG tablet; TAKE 1 TABLET BY MOUTH DAILY BEFORE BREAKFAST.  Dispense: 30 tablet; Refill: 5  4. Other insomnia Stable - traZODone (DESYREL) 50 MG tablet; Take 1 tablet (50 mg total) by mouth at bedtime as needed for sleep.  Dispense: 30 tablet; Refill: 5  5. Need for Tdap vaccination Tdap will be administered by the pharmacy today.   Meds ordered this encounter  Medications  . amLODipine (NORVASC) 10 MG  tablet    Sig: Take 1 tablet (10 mg total) by mouth daily.    Dispense:  30 tablet    Refill:  5  . atorvastatin (LIPITOR) 40 MG tablet    Sig: Take 1 tablet (40 mg total) by mouth daily.    Dispense:  30 tablet    Refill:  5  . carvedilol (COREG) 12.5 MG tablet    Sig: Take 1 tablet (12.5 mg total) by mouth 2 (two) times daily with a meal.    Dispense:  60 tablet    Refill:  5  . levothyroxine (SYNTHROID, LEVOTHROID) 88 MCG tablet    Sig: TAKE 1 TABLET BY MOUTH DAILY BEFORE BREAKFAST.    Dispense:  30  tablet    Refill:  5  . traZODone (DESYREL) 50 MG tablet    Sig: Take 1 tablet (50 mg total) by mouth at bedtime as needed for sleep.    Dispense:  30 tablet    Refill:  5    Follow-up: Return in about 3 months (around 08/22/2017) for Follow-up of chronic medical conditions.   Charlott Rakes MD

## 2017-05-25 NOTE — Patient Instructions (Signed)

## 2017-06-05 MED FILL — $BOOSTRIX VACCINE SYRINGE: 5-2.5-18.5 | 1 days supply | Qty: 1 | Fill #0

## 2017-06-15 ENCOUNTER — Other Ambulatory Visit: Payer: Self-pay | Admitting: Family Medicine

## 2017-06-15 DIAGNOSIS — G4709 Other insomnia: Secondary | ICD-10-CM

## 2017-06-15 DIAGNOSIS — E782 Mixed hyperlipidemia: Secondary | ICD-10-CM

## 2017-06-15 MED FILL — LEVOTHYROXINE 88 MCG TABLET: 88 | 30 days supply | Qty: 30 | Fill #4

## 2017-06-15 MED FILL — ?ATORVASTATIN 40MG TABLET: 40 | 30 days supply | Qty: 30 | Fill #3

## 2017-06-15 MED FILL — AMLODIPINE BESYLATE 10 MG T: 10 | 30 days supply | Qty: 30 | Fill #3

## 2017-06-15 MED FILL — ?CARVEDILOL 12.5 MG TABLET: 12.5 | 30 days supply | Qty: 60 | Fill #3

## 2017-06-18 MED FILL — traZODone HCL 50 MG TABS: 50 | 30 days supply | Qty: 30 | Fill #0

## 2017-07-15 MED FILL — ATORVASTATIN 40 MG TABLET: 40 | 30 days supply | Qty: 30 | Fill #0

## 2017-07-15 MED FILL — ?CARVEDILOL 12.5 MG TABLET: 12.5 | 30 days supply | Qty: 60 | Fill #4

## 2017-07-15 MED FILL — AMLODIPINE BESYLATE 10 MG T: 10 | 30 days supply | Qty: 30 | Fill #4

## 2017-07-15 MED FILL — LEVOTHYROXINE 88 MCG TABLET: 88 | 30 days supply | Qty: 30 | Fill #5

## 2017-07-15 MED FILL — traZODone HCL 50 MG TABS: 50 | 30 days supply | Qty: 30 | Fill #1

## 2017-08-21 ENCOUNTER — Encounter: Payer: Self-pay | Admitting: Family Medicine

## 2017-08-21 ENCOUNTER — Ambulatory Visit: Payer: Self-pay | Attending: Family Medicine | Admitting: Family Medicine

## 2017-08-21 VITALS — BP 149/81 | HR 68 | Temp 97.6°F | Ht 62.0 in | Wt 158.4 lb

## 2017-08-21 DIAGNOSIS — Z885 Allergy status to narcotic agent status: Secondary | ICD-10-CM | POA: Insufficient documentation

## 2017-08-21 DIAGNOSIS — Z79899 Other long term (current) drug therapy: Secondary | ICD-10-CM | POA: Insufficient documentation

## 2017-08-21 DIAGNOSIS — N189 Chronic kidney disease, unspecified: Secondary | ICD-10-CM | POA: Insufficient documentation

## 2017-08-21 DIAGNOSIS — I129 Hypertensive chronic kidney disease with stage 1 through stage 4 chronic kidney disease, or unspecified chronic kidney disease: Secondary | ICD-10-CM | POA: Insufficient documentation

## 2017-08-21 DIAGNOSIS — E038 Other specified hypothyroidism: Secondary | ICD-10-CM

## 2017-08-21 DIAGNOSIS — Z1211 Encounter for screening for malignant neoplasm of colon: Secondary | ICD-10-CM

## 2017-08-21 DIAGNOSIS — E782 Mixed hyperlipidemia: Secondary | ICD-10-CM

## 2017-08-21 DIAGNOSIS — Z7982 Long term (current) use of aspirin: Secondary | ICD-10-CM | POA: Insufficient documentation

## 2017-08-21 DIAGNOSIS — M722 Plantar fascial fibromatosis: Secondary | ICD-10-CM

## 2017-08-21 DIAGNOSIS — R600 Localized edema: Secondary | ICD-10-CM

## 2017-08-21 DIAGNOSIS — Z79890 Hormone replacement therapy (postmenopausal): Secondary | ICD-10-CM | POA: Insufficient documentation

## 2017-08-21 DIAGNOSIS — K219 Gastro-esophageal reflux disease without esophagitis: Secondary | ICD-10-CM | POA: Insufficient documentation

## 2017-08-21 DIAGNOSIS — I10 Essential (primary) hypertension: Secondary | ICD-10-CM

## 2017-08-21 MED ORDER — CARVEDILOL 12.5 MG PO TABS: 12.5000 mg | ORAL_TABLET | Freq: Two times a day (BID) | ORAL | 5 refills | Status: DC

## 2017-08-21 MED ORDER — LISINOPRIL-HYDROCHLOROTHIAZIDE 20-25 MG PO TABS: 1.0000 | ORAL_TABLET | Freq: Every day | ORAL | 6 refills | Status: DC

## 2017-08-21 MED ORDER — ATORVASTATIN CALCIUM 40 MG PO TABS: 40.0000 mg | ORAL_TABLET | Freq: Every day | ORAL | 5 refills | Status: DC

## 2017-08-21 MED FILL — ?CARVEDILOL 12.5 MG TABLET: 12.5 | 30 days supply | Qty: 60 | Fill #0

## 2017-08-21 MED FILL — ?ATORVASTATIN 40MG TABLET: 40 | 30 days supply | Qty: 30 | Fill #0

## 2017-08-21 MED FILL — LISINOPRIL-HCTZ 20-25 MG TA: 20-25 | 30 days supply | Qty: 30 | Fill #0

## 2017-08-21 NOTE — Progress Notes (Signed)
Patient is having swelling in her ankles, and knots on her feet.

## 2017-08-21 NOTE — Patient Instructions (Signed)
Edema Edema is when you have too much fluid in your body or under your skin. Edema may make your legs, feet, and ankles swell up. Swelling is also common in looser tissues, like around your eyes. This is a common condition. It gets more common as you get older. There are many possible causes of edema. Eating too much salt (sodium) and being on your feet or sitting for a long time can cause edema in your legs, feet, and ankles. Hot weather may make edema worse. Edema is usually painless. Your skin may look swollen or shiny. Follow these instructions at home:  Keep the swollen body part raised (elevated) above the level of your heart when you are sitting or lying down.  Do not sit still or stand for a long time.  Do not wear tight clothes. Do not wear garters on your upper legs.  Exercise your legs. This can help the swelling go down.  Wear elastic bandages or support stockings as told by your doctor.  Eat a low-salt (low-sodium) diet to reduce fluid as told by your doctor.  Depending on the cause of your swelling, you may need to limit how much fluid you drink (fluid restriction).  Take over-the-counter and prescription medicines only as told by your doctor. Contact a doctor if:  Treatment is not working.  You have heart, liver, or kidney disease and have symptoms of edema.  You have sudden and unexplained weight gain. Get help right away if:  You have shortness of breath or chest pain.  You cannot breathe when you lie down.  You have pain, redness, or warmth in the swollen areas.  You have heart, liver, or kidney disease and get edema all of a sudden.  You have a fever and your symptoms get worse all of a sudden. Summary  Edema is when you have too much fluid in your body or under your skin.  Edema may make your legs, feet, and ankles swell up. Swelling is also common in looser tissues, like around your eyes.  Raise (elevate) the swollen body part above the level of your  heart when you are sitting or lying down.  Follow your doctor's instructions about diet and how much fluid you can drink (fluid restriction). This information is not intended to replace advice given to you by your health care provider. Make sure you discuss any questions you have with your health care provider. Document Released: 09/17/2007 Document Revised: 04/18/2016 Document Reviewed: 04/18/2016 Elsevier Interactive Patient Education  2017 [employer]  

## 2017-08-21 NOTE — Progress Notes (Signed)
Subjective:  Patient ID: Danielle Berger, female    DOB: [birth year]  Age: 62 y.o. MRN: [id number]  CC: Hypertension   HPI Danielle Berger is a 62 year old female with a history of previous tobacco abuse, hypertension, hyperlipidemia, hypothyroidism who comes into the clinic for a follow-up visit.  She complains of swelling of her feet which starts at about mid day and progresses throughout the course of the day especially when she has been on her feet.  Denies pain in her legs, shortness of breath or chest pain. She has been compliant with her antihypertensive and denies adverse effects from them. Compliant with her statin and denies myalgias and also takes her levothyroxine consistently. She complains about lumps on the soles of both feet which have become annoying for the last 1 to 2 years and she would like them excised as they are uncomfortable when she stands or walks.  Past Medical History:  Diagnosis Date  . Chest pain 07/17/2015  . Chronic renal disease   . Esophageal reflux   . Headache   . HLD (hyperlipidemia)   . Hypertension   . Hypothyroidism   . Tobacco abuse 07/17/2015    Past Surgical History:  Procedure Laterality Date  . Brain Tumor removed      when patient was in the 6th grade  . KIDNEY SURGERY     when patient was int he 8th grade    Allergies  Allergen Reactions  . Codeine Nausea And Vomiting     Outpatient Medications Prior to Visit  Medication Sig Dispense Refill  . aspirin EC 81 MG EC tablet Take 1 tablet (81 mg total) by mouth daily.    Marland Kitchen levothyroxine (SYNTHROID, LEVOTHROID) 88 MCG tablet TAKE 1 TABLET BY MOUTH DAILY BEFORE BREAKFAST. 30 tablet 5  . loratadine (CLARITIN) 10 MG tablet Take 10 mg by mouth daily as needed for allergies.    . traZODone (DESYREL) 50 MG tablet Take 1 tablet (50 mg total) by mouth at bedtime as needed for sleep. 30 tablet 5  . amLODipine (NORVASC) 10 MG tablet Take 1 tablet (10 mg total) by mouth daily. 30 tablet 5  .  atorvastatin (LIPITOR) 40 MG tablet Take 1 tablet (40 mg total) by mouth daily. 30 tablet 5  . carvedilol (COREG) 12.5 MG tablet Take 1 tablet (12.5 mg total) by mouth 2 (two) times daily with a meal. 60 tablet 5   No facility-administered medications prior to visit.     ROS Review of Systems  Constitutional: Negative for activity change, appetite change and fatigue.  HENT: Negative for congestion, sinus pressure and sore throat.   Eyes: Negative for visual disturbance.  Respiratory: Negative for cough, chest tightness, shortness of breath and wheezing.   Cardiovascular: Positive for leg swelling. Negative for chest pain and palpitations.  Gastrointestinal: Negative for abdominal distention, abdominal pain and constipation.  Endocrine: Negative for polydipsia.  Genitourinary: Negative for dysuria and frequency.  Musculoskeletal: Negative for arthralgias and back pain.  Skin: Negative for rash.  Neurological: Negative for tremors, light-headedness and numbness.  Hematological: Does not bruise/bleed easily.  Psychiatric/Behavioral: Negative for agitation and behavioral problems.    Objective:  BP (!) 149/81   Pulse 68   Temp 97.6 F (36.4 C) (Oral)   Ht '5\' 2"'  (1.575 m)   Wt 158 lb 6.4 oz (71.8 kg)   SpO2 97%   BMI 28.97 kg/m   BP/Weight 08/21/2017 05/25/2017 29/07/7652  Systolic BP 650 354 656  Diastolic BP  81 72 71  Wt. (Lbs) 158.4 157.6 153.4  BMI 28.97 28.83 28.06      Physical Exam  Constitutional: She is oriented to person, place, and time. She appears well-developed and well-nourished.  Cardiovascular: Normal rate, normal heart sounds and intact distal pulses.  No murmur heard. Pulmonary/Chest: Effort normal and breath sounds normal. She has no wheezes. She has no rales. She exhibits no tenderness.  Abdominal: Soft. Bowel sounds are normal. She exhibits no distension and no mass. There is no tenderness.  Musculoskeletal: Normal range of motion.  Neurological: She is  alert and oriented to person, place, and time.  Skin: Skin is warm and dry.  Nontender hard nodules attached to plantar tendons of both feet  Psychiatric: She has a normal mood and affect.    CMP Latest Ref Rng & Units 02/19/2017 11/11/2016 05/07/2016  Glucose 65 - 99 mg/dL 107(H) 98 99  BUN 8 - 27 mg/dL '15 20 17  ' Creatinine 0.57 - 1.00 mg/dL 1.04(H) 1.20(H) 1.11(H)  Sodium 134 - 144 mmol/L 144 143 141  Potassium 3.5 - 5.2 mmol/L 4.7 4.7 4.7  Chloride 96 - 106 mmol/L 103 104 104  CO2 20 - 29 mmol/L '24 23 25  ' Calcium 8.7 - 10.3 mg/dL 9.3 9.5 9.8  Total Protein 6.0 - 8.5 g/dL 7.3 7.0 7.5  Total Bilirubin 0.0 - 1.2 mg/dL 0.3 0.3 0.4  Alkaline Phos 39 - 117 IU/L 133(H) 118(H) 122  AST 0 - 40 IU/L '16 20 16  ' ALT 0 - 32 IU/L '18 15 13    ' Lipid Panel     Component Value Date/Time   CHOL 195 02/19/2017 1007   TRIG 173 (H) 02/19/2017 1007   HDL 54 02/19/2017 1007   CHOLHDL 3.6 02/19/2017 1007   CHOLHDL 3.7 05/07/2016 0946   VLDL 34 (H) 05/07/2016 0946   LDLCALC 106 (H) 02/19/2017 1007     Lab Results  Component Value Date   TSH 1.190 02/19/2017    Assessment & Plan:   1. Mixed hyperlipidemia Controlled Low-cholesterol diet - atorvastatin (LIPITOR) 40 MG tablet; Take 1 tablet (40 mg total) by mouth daily.  Dispense: 30 tablet; Refill: 5  2. Essential hypertension Elevated blood pressure Switched from amlodipine to lisinopril/HCTZ due to pedal edema Low-sodium diet - CMP14+EGFR - Lipid panel - lisinopril-hydrochlorothiazide (PRINZIDE,ZESTORETIC) 20-25 MG tablet; Take 1 tablet by mouth daily.  Dispense: 30 tablet; Refill: 6 - carvedilol (COREG) 12.5 MG tablet; Take 1 tablet (12.5 mg total) by mouth 2 (two) times daily with a meal.  Dispense: 60 tablet; Refill: 5  3. Other specified hypothyroidism TSH today and will adjust levothyroxine accordingly - TSH - T4, free  4. Pedal edema Discontinue amlodipine Use compression stockings, elevate feet Low-sodium diet  5.  Plantar fascial fibromatosis of both feet Referred to podiatry  6. Screening for colon cancer - Fecal occult blood, imunochemical([facility]); Future   Meds ordered this encounter  Medications  . lisinopril-hydrochlorothiazide (PRINZIDE,ZESTORETIC) 20-25 MG tablet    Sig: Take 1 tablet by mouth daily.    Dispense:  30 tablet    Refill:  6    Discontinue amlodipine  . atorvastatin (LIPITOR) 40 MG tablet    Sig: Take 1 tablet (40 mg total) by mouth daily.    Dispense:  30 tablet    Refill:  5  . carvedilol (COREG) 12.5 MG tablet    Sig: Take 1 tablet (12.5 mg total) by mouth 2 (two) times daily with a meal.  Dispense:  60 tablet    Refill:  5    Follow-up: Return in about 3 months (around 11/21/2017) for Follow-up of chronic medical conditions.   Charlott Rakes MD

## 2017-08-22 LAB — LIPID PANEL
CHOLESTEROL TOTAL: 212 mg/dL — AB (ref 100–199)
Chol/HDL Ratio: 3.5 ratio (ref 0.0–4.4)
HDL: 60 mg/dL (ref >39)
LDL Calculated: 115 mg/dL — ABNORMAL HIGH (ref 0–99)
Triglycerides: 185 mg/dL — ABNORMAL HIGH (ref 0–149)
VLDL Cholesterol Cal: 37 mg/dL (ref 5–40)

## 2017-08-22 LAB — CMP14+EGFR
ALK PHOS: 123 IU/L — AB (ref 39–117)
ALT: 17 IU/L (ref 0–32)
AST: 18 IU/L (ref 0–40)
Albumin/Globulin Ratio: 2 (ref 1.2–2.2)
Albumin: 4.9 g/dL — ABNORMAL HIGH (ref 3.6–4.8)
BILIRUBIN TOTAL: 0.4 mg/dL (ref 0.0–1.2)
BUN / CREAT RATIO: 16 (ref 12–28)
BUN: 21 mg/dL (ref 8–27)
CHLORIDE: 104 mmol/L (ref 96–106)
CO2: 20 mmol/L (ref 20–29)
Calcium: 9.7 mg/dL (ref 8.7–10.3)
Creatinine, Ser: 1.29 mg/dL — ABNORMAL HIGH (ref 0.57–1.00)
GFR calc Af Amer: 51 mL/min/{1.73_m2} — ABNORMAL LOW (ref >59)
GFR calc non Af Amer: 44 mL/min/{1.73_m2} — ABNORMAL LOW (ref >59)
GLOBULIN, TOTAL: 2.4 g/dL (ref 1.5–4.5)
GLUCOSE: 106 mg/dL — AB (ref 65–99)
Potassium: 4.9 mmol/L (ref 3.5–5.2)
SODIUM: 144 mmol/L (ref 134–144)
Total Protein: 7.3 g/dL (ref 6.0–8.5)

## 2017-08-22 LAB — TSH: TSH: 3.08 u[IU]/mL (ref 0.450–4.500)

## 2017-08-22 LAB — T4, FREE: FREE T4: 1.46 ng/dL (ref 0.82–1.77)

## 2017-08-24 ENCOUNTER — Other Ambulatory Visit: Payer: Self-pay | Admitting: Family Medicine

## 2017-08-24 MED FILL — LEVOTHYROXINE 88 MCG TABLET: 88 | 30 days supply | Qty: 30 | Fill #0

## 2017-08-27 ENCOUNTER — Telehealth: Payer: Self-pay

## 2017-08-27 DIAGNOSIS — E038 Other specified hypothyroidism: Secondary | ICD-10-CM

## 2017-08-27 MED ORDER — LEVOTHYROXINE SODIUM 88 MCG PO TABS: ORAL_TABLET | ORAL | 3 refills | Status: DC

## 2017-08-27 NOTE — Telephone Encounter (Signed)
Patient was called and informed of lab results. 

## 2017-08-27 NOTE — Telephone Encounter (Signed)
-----   Message from Charlott Rakes, MD sent at 08/24/2017 10:43 AM EDT ----- Thyroid panel is normal, cholesterol has increased slightly, please encourage to adhere with a low-cholesterol diet.  Other labs are stable

## 2017-08-31 ENCOUNTER — Telehealth: Payer: Self-pay | Admitting: Family Medicine

## 2017-08-31 DIAGNOSIS — I10 Essential (primary) hypertension: Secondary | ICD-10-CM

## 2017-08-31 MED ORDER — LISINOPRIL-HYDROCHLOROTHIAZIDE 20-25 MG PO TABS: 1.0000 | ORAL_TABLET | Freq: Every day | ORAL | 5 refills | Status: DC

## 2017-08-31 NOTE — Telephone Encounter (Addendum)
Pt states the new blood pressure medication makes her light headed and drops her blood pressure too low. She reports home readings:   Saturday, May 18: 84/46 Sunday, May 19: 0700- 99/61  1100-128/75 1930:165/88 Monday, May 20: 0930- 119/74  She did not take medication lisinopril- HCTZ on Sunday nor today. Today she has taken only the levothyroxine and carvedilol.  She wishes only to take fluid pill only.  Please advise.

## 2017-08-31 NOTE — Telephone Encounter (Signed)
Patient called stating that she was prescribed lisinopril-hydrochlorothiazide (PRINZIDE,ZESTORETIC) 20-25 MG tablet and the medication is making her BP really low. Please f/u with patient.

## 2017-09-01 NOTE — Telephone Encounter (Signed)
Please advise her to take half of her lisinopril/HCTZ 20/25 mg rather than a whole tablet as her blood pressure will not be adequately controlled on just hydrochlorothiazide.  If the pill is too small to split we could send a prescription for 10/12.5 mg to her pharmacy. Thank you.

## 2017-09-01 NOTE — Telephone Encounter (Signed)
Pt is aware to take half of the Lisinopril/ HCTZ. She verbalized understanding. Will call back if she has concerns. Advised to continue to take blood pressures at home.

## 2017-09-09 ENCOUNTER — Ambulatory Visit: Payer: Self-pay

## 2017-09-10 ENCOUNTER — Ambulatory Visit: Payer: Self-pay | Admitting: Podiatry

## 2017-09-15 ENCOUNTER — Ambulatory Visit (HOSPITAL_COMMUNITY)
Admission: EM | Admit: 2017-09-15 | Discharge: 2017-09-15 | Disposition: A | Discharge status: Home / Self Care | Payer: Self-pay | Attending: Physician Assistant | Admitting: Physician Assistant

## 2017-09-15 ENCOUNTER — Encounter (HOSPITAL_COMMUNITY): Payer: Self-pay | Admitting: Emergency Medicine

## 2017-09-15 DIAGNOSIS — J019 Acute sinusitis, unspecified: Secondary | ICD-10-CM

## 2017-09-15 DIAGNOSIS — B9689 Other specified bacterial agents as the cause of diseases classified elsewhere: Secondary | ICD-10-CM

## 2017-09-15 MED ORDER — DOXYCYCLINE HYCLATE 100 MG PO CAPS: 100.0000 mg | ORAL_CAPSULE | Freq: Two times a day (BID) | ORAL | 0 refills | Status: AC

## 2017-09-15 NOTE — ED Triage Notes (Signed)
PT reports new sinus pain and pressure that affects right side of face and forehead. No cough.

## 2017-09-15 NOTE — ED Provider Notes (Signed)
09/15/2017 10:40 AM   DOB: Apr 20, 1955 / MRN: [id number]  SUBJECTIVE:  Danielle Berger is a 62 y.o. female presenting for sinus pain started 4 days ago and is getting worse.  Denies fever, teeth pain.  No cough, sore throat.  Does have a mild headache about the ipsilateral side.  She is allergic to codeine and penicillins.   She  has a past medical history of Chest pain (07/17/2015), Chronic renal disease, Esophageal reflux, Headache, HLD (hyperlipidemia), Hypertension, Hypothyroidism, and Tobacco abuse (07/17/2015).    She  reports that she has been smoking.  She has been smoking about 0.50 packs per day. She has never used smokeless tobacco. She reports that she drinks about 1.2 oz of alcohol per week. She reports that she does not use drugs. She  reports that she currently engages in sexual activity. The patient  has a past surgical history that includes Brain Tumor removed  and Kidney surgery.  Her family history is not on file. She was adopted.  ROS per HPI  OBJECTIVE:  BP (!) 157/103   Pulse 75   Temp 98.3 F (36.8 C) (Oral)   Resp 16   Wt 158 lb (71.7 kg)   SpO2 95%   BMI 28.90 kg/m   Wt Readings from Last 3 Encounters:  09/15/17 158 lb (71.7 kg)  08/21/17 158 lb 6.4 oz (71.8 kg)  05/25/17 157 lb 9.6 oz (71.5 kg)   Temp Readings from Last 3 Encounters:  09/15/17 98.3 F (36.8 C) (Oral)  08/21/17 97.6 F (36.4 C) (Oral)  05/25/17 97.9 F (36.6 C) (Oral)   BP Readings from Last 3 Encounters:  09/15/17 (!) 157/103  08/21/17 (!) 149/81  05/25/17 134/72   Pulse Readings from Last 3 Encounters:  09/15/17 75  08/21/17 68  05/25/17 (!) 58    Physical Exam  Constitutional: She is oriented to person, place, and time. She appears well-nourished. No distress.  HENT:  Head: Normocephalic and atraumatic.  Right Ear: Hearing normal.  Left Ear: Hearing normal.  Nose: No epistaxis. Right sinus exhibits maxillary sinus tenderness and frontal sinus tenderness. Left sinus exhibits  no maxillary sinus tenderness and no frontal sinus tenderness.  Mouth/Throat: Uvula is midline, oropharynx is clear and moist and mucous membranes are normal.  Eyes: Pupils are equal, round, and reactive to light. EOM are normal.  Cardiovascular: Normal rate.  Pulmonary/Chest: Effort normal.  Abdominal: She exhibits no distension.  Neurological: She is alert and oriented to person, place, and time. No cranial nerve deficit. Gait normal.  Skin: Skin is dry. She is not diaphoretic.  Psychiatric: She has a normal mood and affect.  Vitals reviewed.   No results found for this or any previous visit (from the past 72 hour(s)).  No results found.  ASSESSMENT AND PLAN:   Acute bacterial rhinosinusitis: Isolated sinus TTP with ipsilateral HA. Will treat for bacteria.     Discharge Instructions     Please take tylenol 1000 mg every 8 hours along with the medications.         The patient is advised to call or return to clinic if she does not see an improvement in symptoms, or to seek the care of the closest emergency department if she worsens with the above plan.   Philis Fendt, MHS, PA-C 09/15/2017 10:40 AM   Tereasa Coop, PA-C 09/15/17 1040

## 2017-09-15 NOTE — Discharge Instructions (Signed)
Please take tylenol 1000 mg every 8 hours along with the medications.

## 2017-09-21 MED FILL — LEVOTHYROXINE 88 MCG TABLET: 88 | 30 days supply | Qty: 30 | Fill #1

## 2017-09-21 MED FILL — traZODone HCL 50 MG TABS: 50 | 30 days supply | Qty: 30 | Fill #2

## 2017-09-21 MED FILL — ATORVASTATIN CALCIUM 40 MG: 40 | 30 days supply | Qty: 30 | Fill #1

## 2017-09-21 MED FILL — CARVEDILOL 12.5 MG TABLET: 12.5 | 30 days supply | Qty: 60 | Fill #1

## 2017-10-19 ENCOUNTER — Ambulatory Visit: Payer: No Typology Code available for payment source | Admitting: Podiatry

## 2017-10-19 ENCOUNTER — Ambulatory Visit (INDEPENDENT_AMBULATORY_CARE_PROVIDER_SITE_OTHER): Payer: Self-pay

## 2017-10-19 DIAGNOSIS — M722 Plantar fascial fibromatosis: Secondary | ICD-10-CM

## 2017-10-19 DIAGNOSIS — M779 Enthesopathy, unspecified: Secondary | ICD-10-CM

## 2017-10-20 MED FILL — CARVEDILOL 12.5 MG TABLET: 12.5 | 30 days supply | Qty: 60 | Fill #2

## 2017-10-20 MED FILL — ATORVASTATIN CALCIUM 40 MG: 40 | 30 days supply | Qty: 30 | Fill #2

## 2017-10-20 MED FILL — LEVOTHYROXINE 88 MCG TABLET: 88 | 30 days supply | Qty: 30 | Fill #2

## 2017-10-20 MED FILL — traZODone HCL 50 MG TABS: 50 | 30 days supply | Qty: 30 | Fill #3

## 2017-10-28 ENCOUNTER — Other Ambulatory Visit: Payer: Self-pay | Admitting: Podiatry

## 2017-10-28 DIAGNOSIS — M779 Enthesopathy, unspecified: Secondary | ICD-10-CM

## 2017-11-03 NOTE — Progress Notes (Signed)
  Subjective:  Patient ID: Danielle Berger, female    DOB: 08/26/1955,  MRN: [id number]  No chief complaint on file.  62 y.o. female presents with the above complaint.  Having pain on the inside of both feet.  It is painful knots.  Denies injury.  Walking hurts. Past Medical History:  Diagnosis Date  . Chest pain 07/17/2015  . Chronic renal disease   . Esophageal reflux   . Headache   . HLD (hyperlipidemia)   . Hypertension   . Hypothyroidism   . Tobacco abuse 07/17/2015   Past Surgical History:  Procedure Laterality Date  . Brain Tumor removed      when patient was in the 6th grade  . KIDNEY SURGERY     when patient was int he 8th grade    Current Outpatient Medications:  .  aspirin EC 81 MG EC tablet, Take 1 tablet (81 mg total) by mouth daily., Disp: , Rfl:  .  atorvastatin (LIPITOR) 40 MG tablet, Take 1 tablet (40 mg total) by mouth daily., Disp: 30 tablet, Rfl: 5 .  carvedilol (COREG) 12.5 MG tablet, Take 1 tablet (12.5 mg total) by mouth 2 (two) times daily with a meal., Disp: 60 tablet, Rfl: 5 .  levothyroxine (SYNTHROID, LEVOTHROID) 88 MCG tablet, TAKE 1 TABLET BY MOUTH DAILY BEFORE BREAKFAST., Disp: 30 tablet, Rfl: 3 .  lisinopril-hydrochlorothiazide (PRINZIDE,ZESTORETIC) 20-25 MG tablet, Take 1 tablet by mouth daily., Disp: 30 tablet, Rfl: 5 .  loratadine (CLARITIN) 10 MG tablet, Take 10 mg by mouth daily as needed for allergies., Disp: , Rfl:  .  traZODone (DESYREL) 50 MG tablet, Take 1 tablet (50 mg total) by mouth at bedtime as needed for sleep., Disp: 30 tablet, Rfl: 5  Allergies  Allergen Reactions  . Codeine Nausea And Vomiting  . Penicillins Rash   Review of Systems: Negative except as noted in the HPI. Denies N/V/F/Ch. Objective:  There were no vitals filed for this visit. General AA&O x3. Normal mood and affect.  Vascular Dorsalis pedis and posterior tibial pulses  present 2+ bilaterally  Capillary refill normal to all digits. Pedal hair growth normal.    Neurologic Epicritic sensation grossly present.  Dermatologic No open lesions. Interspaces clear of maceration. Nails well groomed and normal in appearance.  Orthopedic: MMT 5/5 in dorsiflexion, plantarflexion, inversion, and eversion. Normal joint ROM without pain or crepitus. Medial arch with multiple plantar fibromas   Assessment & Plan:  Patient was evaluated and treated and all questions answered.  Plantar Fibromas Bilat -Discussed with benefits of removal.  Advised without removal patient is currently smoking.  Pads dispensed. Injection delivered to both-medial arch plantar fibromas with 0.5 cc Marcaine 0.5 cc dexamethasone  Return if symptoms worsen or fail to improve.

## 2017-11-24 ENCOUNTER — Ambulatory Visit: Payer: Self-pay | Attending: Family Medicine | Admitting: Family Medicine

## 2017-11-24 ENCOUNTER — Encounter: Payer: Self-pay | Admitting: Family Medicine

## 2017-11-24 VITALS — BP 181/85 | HR 55 | Temp 98.1°F | Ht 62.0 in | Wt 159.8 lb

## 2017-11-24 DIAGNOSIS — G4709 Other insomnia: Secondary | ICD-10-CM

## 2017-11-24 DIAGNOSIS — Z87891 Personal history of nicotine dependence: Secondary | ICD-10-CM | POA: Insufficient documentation

## 2017-11-24 DIAGNOSIS — Z79899 Other long term (current) drug therapy: Secondary | ICD-10-CM | POA: Insufficient documentation

## 2017-11-24 DIAGNOSIS — E038 Other specified hypothyroidism: Secondary | ICD-10-CM | POA: Insufficient documentation

## 2017-11-24 DIAGNOSIS — E782 Mixed hyperlipidemia: Secondary | ICD-10-CM | POA: Insufficient documentation

## 2017-11-24 DIAGNOSIS — I129 Hypertensive chronic kidney disease with stage 1 through stage 4 chronic kidney disease, or unspecified chronic kidney disease: Secondary | ICD-10-CM | POA: Insufficient documentation

## 2017-11-24 DIAGNOSIS — Z79890 Hormone replacement therapy (postmenopausal): Secondary | ICD-10-CM | POA: Insufficient documentation

## 2017-11-24 DIAGNOSIS — I10 Essential (primary) hypertension: Secondary | ICD-10-CM

## 2017-11-24 DIAGNOSIS — Z7982 Long term (current) use of aspirin: Secondary | ICD-10-CM | POA: Insufficient documentation

## 2017-11-24 DIAGNOSIS — G4700 Insomnia, unspecified: Secondary | ICD-10-CM | POA: Insufficient documentation

## 2017-11-24 DIAGNOSIS — N189 Chronic kidney disease, unspecified: Secondary | ICD-10-CM | POA: Insufficient documentation

## 2017-11-24 DIAGNOSIS — K219 Gastro-esophageal reflux disease without esophagitis: Secondary | ICD-10-CM | POA: Insufficient documentation

## 2017-11-24 MED ORDER — AMLODIPINE BESYLATE 10 MG PO TABS: 10.0000 mg | ORAL_TABLET | Freq: Every day | ORAL | 5 refills | Status: DC

## 2017-11-24 MED ORDER — TRAZODONE HCL 50 MG PO TABS: 50.0000 mg | ORAL_TABLET | Freq: Every evening | ORAL | 5 refills | Status: DC | PRN

## 2017-11-24 MED ORDER — CARVEDILOL 12.5 MG PO TABS: 12.5000 mg | ORAL_TABLET | Freq: Two times a day (BID) | ORAL | 5 refills | Status: DC

## 2017-11-24 MED ORDER — ATORVASTATIN CALCIUM 40 MG PO TABS: 40.0000 mg | ORAL_TABLET | Freq: Every day | ORAL | 5 refills | Status: DC

## 2017-11-24 MED FILL — AMLODIPINE BESYLATE 10 MG T: 10 | 30 days supply | Qty: 30 | Fill #0

## 2017-11-24 MED FILL — ?TRAZODONE HCL 50MG TABS: 50 | 30 days supply | Qty: 30 | Fill #4

## 2017-11-24 MED FILL — ATORVASTATIN CALCIUM 40 MG: 40 | 30 days supply | Qty: 30 | Fill #1

## 2017-11-24 MED FILL — ?CARVEDILOL 12.5 MG TABLET: 12.5 | 30 days supply | Qty: 60 | Fill #0

## 2017-11-24 NOTE — Progress Notes (Signed)
Subjective:  Patient ID: Danielle Berger, female    DOB: 05-Jun-1955  Age: 62 y.o. MRN: [id number]  CC: Hypertension   HPI Danielle Berger  is a 62 year old female with a history of previous tobacco abuse, hypertension, hyperlipidemia, hypothyroidism who comes into the clinic for a follow-up visit.  Her blood pressure is significantly elevated and she attributes this to not taking her antihypertensives today.  She was switched from amlodipine to lisinopril/hydrochlorothiazide at her last office visit due to complains of pedal edema with the former however today she complains of her blood pressure dropping with associated dizziness and would like to have amlodipine reinstated. She continues to smoke but denies dyspnea or chest pains. Doing well on her antihypertensive and levothyroxine. Denies additional concerns at this time She is due for colonoscopy but opted to undergo the fecal occult immunohistochemical testing and has her stool specimen with her today.  Past Medical History:  Diagnosis Date  . Chest pain 07/17/2015  . Chronic renal disease   . Esophageal reflux   . Headache   . HLD (hyperlipidemia)   . Hypertension   . Hypothyroidism   . Tobacco abuse 07/17/2015    Past Surgical History:  Procedure Laterality Date  . Brain Tumor removed      when patient was in the 6th grade  . KIDNEY SURGERY     when patient was int he 8th grade    Allergies  Allergen Reactions  . Codeine Nausea And Vomiting  . Penicillins Rash     Outpatient Medications Prior to Visit  Medication Sig Dispense Refill  . aspirin EC 81 MG EC tablet Take 1 tablet (81 mg total) by mouth daily.    Marland Kitchen levothyroxine (SYNTHROID, LEVOTHROID) 88 MCG tablet TAKE 1 TABLET BY MOUTH DAILY BEFORE BREAKFAST. 30 tablet 3  . loratadine (CLARITIN) 10 MG tablet Take 10 mg by mouth daily as needed for allergies.    Marland Kitchen atorvastatin (LIPITOR) 40 MG tablet Take 1 tablet (40 mg total) by mouth daily. 30 tablet 5  . carvedilol  (COREG) 12.5 MG tablet Take 1 tablet (12.5 mg total) by mouth 2 (two) times daily with a meal. 60 tablet 5  . traZODone (DESYREL) 50 MG tablet Take 1 tablet (50 mg total) by mouth at bedtime as needed for sleep. 30 tablet 5  . lisinopril-hydrochlorothiazide (PRINZIDE,ZESTORETIC) 20-25 MG tablet Take 1 tablet by mouth daily. (Patient not taking: Reported on 11/24/2017) 30 tablet 5   No facility-administered medications prior to visit.     ROS Review of Systems  Constitutional: Negative for activity change, appetite change and fatigue.  HENT: Negative for congestion, sinus pressure and sore throat.   Eyes: Negative for visual disturbance.  Respiratory: Negative for cough, chest tightness, shortness of breath and wheezing.   Cardiovascular: Negative for chest pain and palpitations.  Gastrointestinal: Negative for abdominal distention, abdominal pain and constipation.  Endocrine: Negative for polydipsia.  Genitourinary: Negative for dysuria and frequency.  Musculoskeletal: Negative for arthralgias and back pain.  Skin: Negative for rash.  Neurological: Negative for tremors, light-headedness and numbness.  Hematological: Does not bruise/bleed easily.  Psychiatric/Behavioral: Negative for agitation and behavioral problems.    Objective:  BP (!) 181/85   Pulse (!) 55   Temp 98.1 F (36.7 C) (Oral)   Ht '5\' 2"'  (1.575 m)   Wt 159 lb 12.8 oz (72.5 kg)   SpO2 98%   BMI 29.23 kg/m   BP/Weight 11/24/2017 09/15/2017 3/50/0938  Systolic BP 182 993  161  Diastolic BP 85 096 81  Wt. (Lbs) 159.8 158 158.4  BMI 29.23 28.9 28.97      Physical Exam  Constitutional: She is oriented to person, place, and time. She appears well-developed and well-nourished.  Cardiovascular: Normal rate and intact distal pulses.  Murmur (2/6) heard. Pulmonary/Chest: Effort normal and breath sounds normal. She has no wheezes. She has no rales. She exhibits no tenderness.  Abdominal: Soft. Bowel sounds are normal.  She exhibits no distension and no mass. There is no tenderness.  Musculoskeletal: Normal range of motion.  Neurological: She is alert and oriented to person, place, and time.  Skin: Skin is warm and dry.  Psychiatric: She has a normal mood and affect.     CMP Latest Ref Rng & Units 08/21/2017 02/19/2017 11/11/2016  Glucose 65 - 99 mg/dL 106(H) 107(H) 98  BUN 8 - 27 mg/dL '21 15 20  ' Creatinine 0.57 - 1.00 mg/dL 1.29(H) 1.04(H) 1.20(H)  Sodium 134 - 144 mmol/L 144 144 143  Potassium 3.5 - 5.2 mmol/L 4.9 4.7 4.7  Chloride 96 - 106 mmol/L 104 103 104  CO2 20 - 29 mmol/L '20 24 23  ' Calcium 8.7 - 10.3 mg/dL 9.7 9.3 9.5  Total Protein 6.0 - 8.5 g/dL 7.3 7.3 7.0  Total Bilirubin 0.0 - 1.2 mg/dL 0.4 0.3 0.3  Alkaline Phos 39 - 117 IU/L 123(H) 133(H) 118(H)  AST 0 - 40 IU/L '18 16 20  ' ALT 0 - 32 IU/L '17 18 15    ' Lipid Panel     Component Value Date/Time   CHOL 212 (H) 08/21/2017 0958   TRIG 185 (H) 08/21/2017 0958   HDL 60 08/21/2017 0958   CHOLHDL 3.5 08/21/2017 0958   CHOLHDL 3.7 05/07/2016 0946   VLDL 34 (H) 05/07/2016 0946   LDLCALC 115 (H) 08/21/2017 0958    Lab Results  Component Value Date   TSH 3.080 08/21/2017    Assessment & Plan:   1. Essential hypertension Uncontrolled due to not taking medications this morning I have switched from lisinopril/HCTZ to amlodipine as per patient request Compliance emphasized Counseled on blood pressure goal of less than 130/80, low-sodium, DASH diet, medication compliance, 150 minutes of moderate intensity exercise per week. Discussed medication compliance, adverse effects. - amLODipine (NORVASC) 10 MG tablet; Take 1 tablet (10 mg total) by mouth daily.  Dispense: 30 tablet; Refill: 5 - carvedilol (COREG) 12.5 MG tablet; Take 1 tablet (12.5 mg total) by mouth 2 (two) times daily with a meal.  Dispense: 60 tablet; Refill: 5  2. Other insomnia Controlled - traZODone (DESYREL) 50 MG tablet; Take 1 tablet (50 mg total) by mouth at bedtime  as needed for sleep.  Dispense: 30 tablet; Refill: 5  3. Mixed hyperlipidemia Uncontrolled Lipid panel today and will adjust regimen accordingly Low-cholesterol diet - Lipid panel - CMP14+EGFR - atorvastatin (LIPITOR) 40 MG tablet; Take 1 tablet (40 mg total) by mouth daily.  Dispense: 30 tablet; Refill: 5  4. Other specified hypothyroidism Controlled - TSH - T4, free   Meds ordered this encounter  Medications  . amLODipine (NORVASC) 10 MG tablet    Sig: Take 1 tablet (10 mg total) by mouth daily.    Dispense:  30 tablet    Refill:  5    Discontinue Lisinopril/hctz  . traZODone (DESYREL) 50 MG tablet    Sig: Take 1 tablet (50 mg total) by mouth at bedtime as needed for sleep.    Dispense:  30 tablet  Refill:  5  . carvedilol (COREG) 12.5 MG tablet    Sig: Take 1 tablet (12.5 mg total) by mouth 2 (two) times daily with a meal.    Dispense:  60 tablet    Refill:  5  . atorvastatin (LIPITOR) 40 MG tablet    Sig: Take 1 tablet (40 mg total) by mouth daily.    Dispense:  30 tablet    Refill:  5    Follow-up: Return in about 3 months (around 02/24/2018) for follow up on chronic medical conditions.   Charlott Rakes MD

## 2017-11-24 NOTE — Patient Instructions (Signed)

## 2017-11-25 ENCOUNTER — Other Ambulatory Visit: Payer: Self-pay | Admitting: Family Medicine

## 2017-11-25 DIAGNOSIS — E038 Other specified hypothyroidism: Secondary | ICD-10-CM

## 2017-11-25 DIAGNOSIS — E782 Mixed hyperlipidemia: Secondary | ICD-10-CM

## 2017-11-25 LAB — TSH: TSH: 1.14 u[IU]/mL (ref 0.450–4.500)

## 2017-11-25 LAB — CMP14+EGFR
ALK PHOS: 96 IU/L (ref 39–117)
ALT: 12 IU/L (ref 0–32)
AST: 16 IU/L (ref 0–40)
Albumin/Globulin Ratio: 2.1 (ref 1.2–2.2)
Albumin: 4.6 g/dL (ref 3.6–4.8)
BILIRUBIN TOTAL: 0.2 mg/dL (ref 0.0–1.2)
BUN/Creatinine Ratio: 17 (ref 12–28)
BUN: 18 mg/dL (ref 8–27)
CHLORIDE: 107 mmol/L — AB (ref 96–106)
CO2: 24 mmol/L (ref 20–29)
Calcium: 9.5 mg/dL (ref 8.7–10.3)
Creatinine, Ser: 1.07 mg/dL — ABNORMAL HIGH (ref 0.57–1.00)
GFR calc Af Amer: 64 mL/min/{1.73_m2} (ref >59)
GFR calc non Af Amer: 55 mL/min/{1.73_m2} — ABNORMAL LOW (ref >59)
GLOBULIN, TOTAL: 2.2 g/dL (ref 1.5–4.5)
GLUCOSE: 103 mg/dL — AB (ref 65–99)
Potassium: 5.1 mmol/L (ref 3.5–5.2)
SODIUM: 144 mmol/L (ref 134–144)
Total Protein: 6.8 g/dL (ref 6.0–8.5)

## 2017-11-25 LAB — LIPID PANEL
CHOLESTEROL TOTAL: 217 mg/dL — AB (ref 100–199)
Chol/HDL Ratio: 4.5 ratio — ABNORMAL HIGH (ref 0.0–4.4)
HDL: 48 mg/dL (ref >39)
LDL CALC: 127 mg/dL — AB (ref 0–99)
TRIGLYCERIDES: 211 mg/dL — AB (ref 0–149)
VLDL CHOLESTEROL CAL: 42 mg/dL — AB (ref 5–40)

## 2017-11-25 LAB — T4, FREE: FREE T4: 1.48 ng/dL (ref 0.82–1.77)

## 2017-11-25 MED ORDER — ATORVASTATIN CALCIUM 80 MG PO TABS: 80.0000 mg | ORAL_TABLET | Freq: Every day | ORAL | 3 refills | Status: DC

## 2017-11-25 MED ORDER — LEVOTHYROXINE SODIUM 88 MCG PO TABS: ORAL_TABLET | ORAL | 3 refills | Status: DC

## 2017-11-25 MED FILL — LEVOTHYROXINE 88 MCG TABLET: 88 | 30 days supply | Qty: 30 | Fill #0

## 2017-11-27 ENCOUNTER — Telehealth: Payer: Self-pay

## 2017-11-27 NOTE — Telephone Encounter (Signed)
Patient was called and informed of lab results, and medication change.

## 2017-11-27 NOTE — Telephone Encounter (Signed)
-----   Message from Charlott Rakes, MD sent at 11/25/2017  1:15 PM EDT ----- Thyroid is normal but cholesterol is elevated and so I have reduced her dose of Lipitor to 80 mg and prescription sent to her pharmacy.  Please advise to adhere to a low-cholesterol diet and exercise.

## 2017-12-09 MED FILL — ATORVASTATIN 80 MG TABLET: 80 | 30 days supply | Qty: 30 | Fill #0

## 2017-12-22 LAB — FECAL OCCULT BLOOD, IMMUNOCHEMICAL

## 2017-12-22 MED FILL — AMLODIPINE BESYLATE 10 MG T: 10 | 30 days supply | Qty: 30 | Fill #1

## 2017-12-22 MED FILL — LEVOTHYROXINE 88 MCG TABLET: 88 | 30 days supply | Qty: 30 | Fill #1

## 2017-12-25 ENCOUNTER — Telehealth: Payer: Self-pay

## 2017-12-25 DIAGNOSIS — Z1211 Encounter for screening for malignant neoplasm of colon: Secondary | ICD-10-CM

## 2017-12-25 NOTE — Telephone Encounter (Signed)
Patient was called and informed of lab results. Patient states that she would come in and get new lit to repeat test.

## 2017-12-25 NOTE — Telephone Encounter (Signed)
-----   Message from Charlott Rakes, MD sent at 12/23/2017  7:49 PM EDT ----- Stool test was cancelled due to keeping specimen for a long time. She will need a repeat

## 2018-01-14 ENCOUNTER — Encounter (HOSPITAL_COMMUNITY): Payer: Self-pay | Admitting: Emergency Medicine

## 2018-01-14 ENCOUNTER — Ambulatory Visit (HOSPITAL_COMMUNITY)
Admission: EM | Admit: 2018-01-14 | Discharge: 2018-01-14 | Disposition: A | Discharge status: Home / Self Care | Payer: Self-pay | Attending: Family Medicine | Admitting: Family Medicine

## 2018-01-14 DIAGNOSIS — B9789 Other viral agents as the cause of diseases classified elsewhere: Secondary | ICD-10-CM

## 2018-01-14 DIAGNOSIS — J069 Acute upper respiratory infection, unspecified: Secondary | ICD-10-CM

## 2018-01-14 MED ORDER — PREDNISONE 20 MG PO TABS: ORAL_TABLET | ORAL | 0 refills | Status: DC

## 2018-01-14 MED ORDER — HYDROCODONE-HOMATROPINE 5-1.5 MG/5ML PO SYRP: 5.0000 mL | ORAL_SOLUTION | Freq: Four times a day (QID) | ORAL | 0 refills | Status: DC | PRN

## 2018-01-14 NOTE — ED Triage Notes (Signed)
Pt here for URI sx x several days not relieved with OTC meds

## 2018-01-14 NOTE — Discharge Instructions (Signed)
Please return if your symptoms persist.

## 2018-01-14 NOTE — ED Provider Notes (Signed)
[facility]    CSN: [id number] Arrival date & time: 01/14/18  1110     History   Chief Complaint Chief Complaint  Patient presents with  . URI    HPI Danielle Berger is a 62 y.o. female.   Pt here for URI sx for the last 8 days not relieved with OTC meds including Robitussin and NyQuil.  Patient is a smoker.  She denies a history of asthma.  She has been wheezing and coughing quite a bit.  She has mild sinus congestion but no ear pain, fever, nausea or vomiting.  Patient has had some shortness of breath with activity and has some mild chest pain and tightness when coughing.     Past Medical History:  Diagnosis Date  . Chest pain 07/17/2015  . Chronic renal disease   . Esophageal reflux   . Headache   . HLD (hyperlipidemia)   . Hypertension   . Hypothyroidism   . Tobacco abuse 07/17/2015    Patient Active Problem List   Diagnosis Date Noted  . Plantar fascial fibromatosis of both feet 08/21/2017  . Essential hypertension   . HLD (hyperlipidemia)   . Hypothyroidism   . Esophageal reflux   . Pain in the chest   . Chronic renal disease   . Hypertension 07/17/2015  . Hypertensive urgency 07/17/2015  . Chest pain 07/17/2015  . Tobacco abuse 07/17/2015    Past Surgical History:  Procedure Laterality Date  . Brain Tumor removed      when patient was in the 6th grade  . KIDNEY SURGERY     when patient was int he 8th grade    OB History   None      Home Medications    Prior to Admission medications   Medication Sig Start Date End Date Taking? Authorizing Provider  amLODipine (NORVASC) 10 MG tablet Take 1 tablet (10 mg total) by mouth daily. 11/24/17   Charlott Rakes, MD  aspirin EC 81 MG EC tablet Take 1 tablet (81 mg total) by mouth daily. 07/18/15   Barton Dubois, MD  atorvastatin (LIPITOR) 80 MG tablet Take 1 tablet (80 mg total) by mouth daily. 11/25/17   Charlott Rakes, MD  carvedilol (COREG) 12.5 MG tablet Take 1 tablet (12.5 mg total) by  mouth 2 (two) times daily with a meal. 11/24/17   Newlin, Charlane Ferretti, MD  HYDROcodone-homatropine (HYDROMET) 5-1.5 MG/5ML syrup Take 5 mLs by mouth every 6 (six) hours as needed for cough. 01/14/18   Robyn Haber, MD  levothyroxine (SYNTHROID, LEVOTHROID) 88 MCG tablet TAKE 1 TABLET BY MOUTH DAILY BEFORE BREAKFAST. 11/25/17   Charlott Rakes, MD  loratadine (CLARITIN) 10 MG tablet Take 10 mg by mouth daily as needed for allergies.    [provider]  predniSONE (DELTASONE) 20 MG tablet Two daily with food 01/14/18   Robyn Haber, MD  traZODone (DESYREL) 50 MG tablet Take 1 tablet (50 mg total) by mouth at bedtime as needed for sleep. 11/24/17   Charlott Rakes, MD    Family History Family History  Adopted: Yes    Social History Social History   Tobacco Use  . Smoking status: Current Every Day Smoker    Packs/day: 0.50  . Smokeless tobacco: Never Used  Substance Use Topics  . Alcohol use: Yes    Alcohol/week: 2.0 standard drinks    Types: 1 Glasses of wine, 1 Shots of liquor per week    Comment: occ  . Drug use: No  Allergies   Codeine and Penicillins   Review of Systems Review of Systems  HENT: Positive for congestion.   Respiratory: Positive for cough, chest tightness, shortness of breath and wheezing.   Gastrointestinal: Negative.   All other systems reviewed and are negative.    Physical Exam Triage Vital Signs ED Triage Vitals [01/14/18 1130]  Enc Vitals Group     BP 135/81     Pulse Rate 88     Resp 18     Temp 97.9 F (36.6 C)     Temp Source Oral     SpO2 95 %     Weight      Height      Head Circumference      Peak Flow      Pain Score      Pain Loc      Pain Edu?      Excl. in [place]?    No data found.  Updated Vital Signs BP 135/81 (BP Location: Left Arm)   Pulse 88   Temp 97.9 F (36.6 C) (Oral)   Resp 18   SpO2 95%    Physical Exam  Constitutional: She is oriented to person, place, and time. She appears well-developed and  well-nourished.  HENT:  Head: Normocephalic.  Right Ear: External ear normal.  Left Ear: External ear normal.  Mouth/Throat: Oropharynx is clear and moist.  Poor dental condition  Eyes: Pupils are equal, round, and reactive to light. Conjunctivae are normal.  Neck: Normal range of motion. Neck supple.  Cardiovascular: Normal rate, regular rhythm and normal heart sounds.  Pulmonary/Chest: Effort normal. She has wheezes.  Musculoskeletal: Normal range of motion. She exhibits no edema.  Neurological: She is alert and oriented to person, place, and time.  Skin: Skin is warm and dry.  Nursing note and vitals reviewed.    UC Treatments / Results  Labs (all labs ordered are listed, but only abnormal results are displayed) Labs Reviewed - No data to display  EKG None  Radiology No results found.  Procedures Procedures (including critical care time)  Medications Ordered in UC Medications - No data to display  Initial Impression / Assessment and Plan / UC Course  I have reviewed the triage vital signs and the nursing notes.  Pertinent labs & imaging results that were available during my care of the patient were reviewed by me and considered in my medical decision making (see chart for details).    Final Clinical Impressions(s) / UC Diagnoses   Final diagnoses:  Viral URI with cough   Discharge Instructions   None    ED Prescriptions    Medication Sig Dispense Auth. Provider   predniSONE (DELTASONE) 20 MG tablet Two daily with food 10 tablet Robyn Haber, MD   HYDROcodone-homatropine (HYDROMET) 5-1.5 MG/5ML syrup Take 5 mLs by mouth every 6 (six) hours as needed for cough. 60 mL Robyn Haber, MD     Controlled Substance Prescriptions [place] Controlled Substance Registry consulted? Not Applicable   Robyn Haber, MD 01/14/18 1152

## 2018-01-21 MED FILL — LEVOTHYROXINE 88 MCG TABLET: 88 | 30 days supply | Qty: 30 | Fill #2

## 2018-01-21 MED FILL — AMLODIPINE BESYLATE 10 MG T: 10 | 30 days supply | Qty: 30 | Fill #2

## 2018-01-28 ENCOUNTER — Other Ambulatory Visit: Payer: Self-pay | Admitting: Family Medicine

## 2018-01-28 DIAGNOSIS — I10 Essential (primary) hypertension: Secondary | ICD-10-CM

## 2018-01-31 LAB — FECAL OCCULT BLOOD, IMMUNOCHEMICAL: Fecal Occult Bld: NEGATIVE

## 2018-02-09 ENCOUNTER — Telehealth: Payer: Self-pay

## 2018-02-09 NOTE — Telephone Encounter (Signed)
Patient was called and informed of lab results. 

## 2018-02-09 NOTE — Telephone Encounter (Signed)
-----   Message from Charlott Rakes, MD sent at 02/01/2018  1:41 PM EDT ----- Stool occult test is negative for blood

## 2018-02-22 ENCOUNTER — Encounter: Payer: Self-pay | Admitting: Family Medicine

## 2018-02-22 ENCOUNTER — Ambulatory Visit: Payer: Self-pay | Attending: Family Medicine | Admitting: Family Medicine

## 2018-02-22 VITALS — BP 115/78 | HR 79 | Temp 97.6°F | Ht 62.0 in | Wt 162.2 lb

## 2018-02-22 DIAGNOSIS — Z7982 Long term (current) use of aspirin: Secondary | ICD-10-CM | POA: Insufficient documentation

## 2018-02-22 DIAGNOSIS — Z79899 Other long term (current) drug therapy: Secondary | ICD-10-CM | POA: Insufficient documentation

## 2018-02-22 DIAGNOSIS — E782 Mixed hyperlipidemia: Secondary | ICD-10-CM | POA: Insufficient documentation

## 2018-02-22 DIAGNOSIS — N189 Chronic kidney disease, unspecified: Secondary | ICD-10-CM | POA: Insufficient documentation

## 2018-02-22 DIAGNOSIS — G4700 Insomnia, unspecified: Secondary | ICD-10-CM | POA: Insufficient documentation

## 2018-02-22 DIAGNOSIS — Z79890 Hormone replacement therapy (postmenopausal): Secondary | ICD-10-CM | POA: Insufficient documentation

## 2018-02-22 DIAGNOSIS — G4709 Other insomnia: Secondary | ICD-10-CM

## 2018-02-22 DIAGNOSIS — K219 Gastro-esophageal reflux disease without esophagitis: Secondary | ICD-10-CM | POA: Insufficient documentation

## 2018-02-22 DIAGNOSIS — E038 Other specified hypothyroidism: Secondary | ICD-10-CM | POA: Insufficient documentation

## 2018-02-22 DIAGNOSIS — I10 Essential (primary) hypertension: Secondary | ICD-10-CM

## 2018-02-22 DIAGNOSIS — I129 Hypertensive chronic kidney disease with stage 1 through stage 4 chronic kidney disease, or unspecified chronic kidney disease: Secondary | ICD-10-CM | POA: Insufficient documentation

## 2018-02-22 MED ORDER — ATORVASTATIN CALCIUM 80 MG PO TABS: 80.0000 mg | ORAL_TABLET | Freq: Every day | ORAL | 3 refills | Status: DC

## 2018-02-22 MED ORDER — CARVEDILOL 12.5 MG PO TABS: 12.5000 mg | ORAL_TABLET | Freq: Two times a day (BID) | ORAL | 5 refills | Status: DC

## 2018-02-22 MED ORDER — LISINOPRIL-HYDROCHLOROTHIAZIDE 10-12.5 MG PO TABS: 1.0000 | ORAL_TABLET | Freq: Every day | ORAL | 1 refills | Status: DC

## 2018-02-22 MED ORDER — TRAZODONE HCL 50 MG PO TABS: 50.0000 mg | ORAL_TABLET | Freq: Every evening | ORAL | 5 refills | Status: DC | PRN

## 2018-02-22 MED FILL — ATORVASTATIN 80 MG TABLET: 80 | 30 days supply | Qty: 30 | Fill #0

## 2018-02-22 MED FILL — LISINOPRIL-HCTZ 10-12.5 MG: 10-12.5 | 90 days supply | Qty: 90 | Fill #0

## 2018-02-22 MED FILL — ?CARVEDILOL 12.5 MG TABLET: 12.5 | 30 days supply | Qty: 60 | Fill #1

## 2018-02-22 MED FILL — ?TRAZODONE HCL 50MG TABS: 50 | 30 days supply | Qty: 30 | Fill #5

## 2018-02-22 NOTE — Progress Notes (Signed)
Subjective:  Patient ID: Danielle Berger, female    DOB: 1955-05-14  Age: 62 y.o. MRN: [id number]  CC: Hypertension   HPI RYLAH FUKUDA is a 62 year old female with a history of previous tobacco abuse (quit in 04/2017), hypertension, hyperlipidemia, hypothyroidism who comes into the clinic for a follow-up visit.  She noticed intermittent left ankle swelling which is absent at this time and reverted to taking her old lisinopril/HCTZ 20/25 mg tablets which improved her pedal edema but she took half of that in addition to her amlodipine.  She has run out of carvedilol. Peviously had discontinued amlodipine and placed on lisinopril/HCTZ 20/25 which she had complained it caused her blood pressure to drop too low and she was switched back to amlodipine.  She is wondering if she can go back on lisinopril/HCTZ. She has been compliant with her statin and denies myalgia and is doing well on levothyroxine. She does not exercise regularly and has not been adherent to a low-cholesterol, low-sodium diet but promises to do better.  Past Medical History:  Diagnosis Date  . Chest pain 07/17/2015  . Chronic renal disease   . Esophageal reflux   . Headache   . HLD (hyperlipidemia)   . Hypertension   . Hypothyroidism   . Tobacco abuse 07/17/2015    Past Surgical History:  Procedure Laterality Date  . Brain Tumor removed      when patient was in the 6th grade  . KIDNEY SURGERY     when patient was int he 8th grade    Allergies  Allergen Reactions  . Codeine Nausea And Vomiting  . Penicillins Rash     Outpatient Medications Prior to Visit  Medication Sig Dispense Refill  . aspirin EC 81 MG EC tablet Take 1 tablet (81 mg total) by mouth daily.    Marland Kitchen levothyroxine (SYNTHROID, LEVOTHROID) 88 MCG tablet TAKE 1 TABLET BY MOUTH DAILY BEFORE BREAKFAST. 30 tablet 3  . loratadine (CLARITIN) 10 MG tablet Take 10 mg by mouth daily as needed for allergies.    Marland Kitchen amLODipine (NORVASC) 10 MG tablet Take 1  tablet (10 mg total) by mouth daily. 30 tablet 5  . atorvastatin (LIPITOR) 80 MG tablet Take 1 tablet (80 mg total) by mouth daily. 30 tablet 3  . carvedilol (COREG) 12.5 MG tablet Take 1 tablet (12.5 mg total) by mouth 2 (two) times daily with a meal. 60 tablet 5  . traZODone (DESYREL) 50 MG tablet Take 1 tablet (50 mg total) by mouth at bedtime as needed for sleep. 30 tablet 5  . HYDROcodone-homatropine (HYDROMET) 5-1.5 MG/5ML syrup Take 5 mLs by mouth every 6 (six) hours as needed for cough. (Patient not taking: Reported on 02/22/2018) 60 mL 0  . predniSONE (DELTASONE) 20 MG tablet Two daily with food (Patient not taking: Reported on 02/22/2018) 10 tablet 0   No facility-administered medications prior to visit.     ROS Review of Systems  Constitutional: Negative for activity change, appetite change and fatigue.  HENT: Negative for congestion, sinus pressure and sore throat.   Eyes: Negative for visual disturbance.  Respiratory: Negative for cough, chest tightness, shortness of breath and wheezing.   Cardiovascular: Positive for leg swelling. Negative for chest pain and palpitations.  Gastrointestinal: Negative for abdominal distention, abdominal pain and constipation.  Endocrine: Negative for polydipsia.  Genitourinary: Negative for dysuria and frequency.  Musculoskeletal: Negative for arthralgias and back pain.  Skin: Negative for rash.  Neurological: Negative for tremors, light-headedness and numbness.  Hematological: Does not bruise/bleed easily.  Psychiatric/Behavioral: Negative for agitation and behavioral problems.    Objective:  BP 115/78   Pulse 79   Temp 97.6 F (36.4 C) (Oral)   Ht '5\' 2"'  (1.575 m)   Wt 162 lb 3.2 oz (73.6 kg)   SpO2 97%   BMI 29.67 kg/m   BP/Weight 02/22/2018 01/14/2018 6/57/8469  Systolic BP 629 528 413  Diastolic BP 78 81 85  Wt. (Lbs) 162.2 - 159.8  BMI 29.67 - 29.23      Physical Exam  Constitutional: She is oriented to person, place,  and time. She appears well-developed and well-nourished.  Cardiovascular: Normal rate, normal heart sounds and intact distal pulses.  No murmur heard. Pulmonary/Chest: Effort normal and breath sounds normal. She has no wheezes. She has no rales. She exhibits no tenderness.  Abdominal: Soft. Bowel sounds are normal. She exhibits no distension and no mass. There is no tenderness.  Musculoskeletal: Normal range of motion.  Neurological: She is alert and oriented to person, place, and time.  Skin: Skin is warm and dry.  Psychiatric: She has a normal mood and affect.     CMP Latest Ref Rng & Units 11/24/2017 08/21/2017 02/19/2017  Glucose 65 - 99 mg/dL 103(H) 106(H) 107(H)  BUN 8 - 27 mg/dL '18 21 15  ' Creatinine 0.57 - 1.00 mg/dL 1.07(H) 1.29(H) 1.04(H)  Sodium 134 - 144 mmol/L 144 144 144  Potassium 3.5 - 5.2 mmol/L 5.1 4.9 4.7  Chloride 96 - 106 mmol/L 107(H) 104 103  CO2 20 - 29 mmol/L '24 20 24  ' Calcium 8.7 - 10.3 mg/dL 9.5 9.7 9.3  Total Protein 6.0 - 8.5 g/dL 6.8 7.3 7.3  Total Bilirubin 0.0 - 1.2 mg/dL 0.2 0.4 0.3  Alkaline Phos 39 - 117 IU/L 96 123(H) 133(H)  AST 0 - 40 IU/L '16 18 16  ' ALT 0 - 32 IU/L '12 17 18    ' Lipid Panel     Component Value Date/Time   CHOL 217 (H) 11/24/2017 0945   TRIG 211 (H) 11/24/2017 0945   HDL 48 11/24/2017 0945   CHOLHDL 4.5 (H) 11/24/2017 0945   CHOLHDL 3.7 05/07/2016 0946   VLDL 34 (H) 05/07/2016 0946   LDLCALC 127 (H) 11/24/2017 0945    Lab Results  Component Value Date   TSH 1.140 11/24/2017    The 10-year ASCVD risk score Mikey Bussing DC Jr., et al., 2013) is: 10.6%   Values used to calculate the score:     Age: 46 years     Sex: Female     Is Non-Hispanic African American: No     Diabetic: No     Tobacco smoker: Yes     Systolic Blood Pressure: 244 mmHg     Is BP treated: Yes     HDL Cholesterol: 48 mg/dL     Total Cholesterol: 217 mg/dL  Assessment & Plan:   1. Mixed hyperlipidemia Uncontrolled ASCVD risk is 10.6% Low-cholesterol  diet, lifestyle changes - atorvastatin (LIPITOR) 80 MG tablet; Take 1 tablet (80 mg total) by mouth daily.  Dispense: 30 tablet; Refill: 3  2. Essential hypertension Controlled Discontinue amlodipine due to pedal edema and resume lisinopril/HCTZ Counseled on blood pressure goal of less than 130/80, low-sodium, DASH diet, medication compliance, 150 minutes of moderate intensity exercise per week. Discussed medication compliance, adverse effects. - carvedilol (COREG) 12.5 MG tablet; Take 1 tablet (12.5 mg total) by mouth 2 (two) times daily with a meal.  Dispense: 60 tablet; Refill:  5 - CMP14+EGFR  3. Other insomnia Stable - traZODone (DESYREL) 50 MG tablet; Take 1 tablet (50 mg total) by mouth at bedtime as needed for sleep.  Dispense: 30 tablet; Refill: 5  4. Other specified hypothyroidism Controlled - TSH   Meds ordered this encounter  Medications  . lisinopril-hydrochlorothiazide (PRINZIDE,ZESTORETIC) 10-12.5 MG tablet    Sig: Take 1 tablet by mouth daily.    Dispense:  90 tablet    Refill:  1    Discontinue Amlodipine  . atorvastatin (LIPITOR) 80 MG tablet    Sig: Take 1 tablet (80 mg total) by mouth daily.    Dispense:  30 tablet    Refill:  3    Discontinue previous dose  . carvedilol (COREG) 12.5 MG tablet    Sig: Take 1 tablet (12.5 mg total) by mouth 2 (two) times daily with a meal.    Dispense:  60 tablet    Refill:  5  . traZODone (DESYREL) 50 MG tablet    Sig: Take 1 tablet (50 mg total) by mouth at bedtime as needed for sleep.    Dispense:  30 tablet    Refill:  5    Follow-up: Return in about 3 months (around 05/25/2018) for follow up of chronic medical conditions.   Charlott Rakes MD

## 2018-02-23 ENCOUNTER — Other Ambulatory Visit: Payer: Self-pay | Admitting: Family Medicine

## 2018-02-23 DIAGNOSIS — E038 Other specified hypothyroidism: Secondary | ICD-10-CM

## 2018-02-23 LAB — CMP14+EGFR
ALBUMIN: 4.7 g/dL (ref 3.6–4.8)
ALK PHOS: 117 IU/L (ref 39–117)
ALT: 23 IU/L (ref 0–32)
AST: 22 IU/L (ref 0–40)
Albumin/Globulin Ratio: 2 (ref 1.2–2.2)
BUN / CREAT RATIO: 18 (ref 12–28)
BUN: 26 mg/dL (ref 8–27)
Bilirubin Total: 0.4 mg/dL (ref 0.0–1.2)
CO2: 23 mmol/L (ref 20–29)
CREATININE: 1.41 mg/dL — AB (ref 0.57–1.00)
Calcium: 9.7 mg/dL (ref 8.7–10.3)
Chloride: 98 mmol/L (ref 96–106)
GFR calc Af Amer: 46 mL/min/{1.73_m2} — ABNORMAL LOW (ref >59)
GFR calc non Af Amer: 40 mL/min/{1.73_m2} — ABNORMAL LOW (ref >59)
GLOBULIN, TOTAL: 2.4 g/dL (ref 1.5–4.5)
Glucose: 103 mg/dL — ABNORMAL HIGH (ref 65–99)
Potassium: 4.9 mmol/L (ref 3.5–5.2)
SODIUM: 138 mmol/L (ref 134–144)
Total Protein: 7.1 g/dL (ref 6.0–8.5)

## 2018-02-23 LAB — TSH: TSH: 2.51 u[IU]/mL (ref 0.450–4.500)

## 2018-02-23 MED ORDER — LEVOTHYROXINE SODIUM 88 MCG PO TABS: ORAL_TABLET | ORAL | 3 refills | Status: DC

## 2018-02-23 MED FILL — LEVOTHYROXINE 88 MCG TABLET: 88 | 30 days supply | Qty: 30 | Fill #0

## 2018-02-24 ENCOUNTER — Ambulatory Visit: Payer: Self-pay | Admitting: Family Medicine

## 2018-02-26 ENCOUNTER — Telehealth: Payer: Self-pay

## 2018-02-26 NOTE — Telephone Encounter (Signed)
Patient was called and informed of lab results. 

## 2018-02-26 NOTE — Telephone Encounter (Signed)
-----   Message from Charlott Rakes, MD sent at 02/23/2018  1:28 PM EST ----- Thyroid is normal, continue current dose of Levothyroxine. Kidney shows slight decline.  Please advise to limit NSAID use and increase fluid intake

## 2018-03-29 MED FILL — CARVEDILOL 12.5 MG TABLET: 12.5 | 30 days supply | Qty: 60 | Fill #2

## 2018-03-29 MED FILL — LEVOTHYROXINE 88 MCG TABLET: 88 | 30 days supply | Qty: 30 | Fill #1

## 2018-04-22 ENCOUNTER — Ambulatory Visit: Payer: Self-pay | Attending: Family Medicine

## 2018-04-22 MED FILL — LEVOTHYROXINE 88 MCG TABLET: 88 | 30 days supply | Qty: 30 | Fill #2

## 2018-04-22 MED FILL — CARVEDILOL 12.5 MG TABLET: 12.5 | 30 days supply | Qty: 60 | Fill #3

## 2018-05-12 MED FILL — ATORVASTATIN 80 MG TABLET: 80 | 30 days supply | Qty: 30 | Fill #1

## 2018-05-17 ENCOUNTER — Ambulatory Visit: Payer: Self-pay | Admitting: Family Medicine

## 2018-05-25 ENCOUNTER — Telehealth: Payer: Self-pay | Admitting: Family Medicine

## 2018-05-25 ENCOUNTER — Encounter: Payer: Self-pay | Admitting: Family Medicine

## 2018-05-25 ENCOUNTER — Ambulatory Visit: Payer: Self-pay | Attending: Family Medicine | Admitting: Family Medicine

## 2018-05-25 VITALS — BP 106/68 | HR 57 | Temp 98.3°F | Ht 62.0 in | Wt 166.0 lb

## 2018-05-25 DIAGNOSIS — Z880 Allergy status to penicillin: Secondary | ICD-10-CM | POA: Insufficient documentation

## 2018-05-25 DIAGNOSIS — E782 Mixed hyperlipidemia: Secondary | ICD-10-CM

## 2018-05-25 DIAGNOSIS — Z7982 Long term (current) use of aspirin: Secondary | ICD-10-CM | POA: Insufficient documentation

## 2018-05-25 DIAGNOSIS — J029 Acute pharyngitis, unspecified: Secondary | ICD-10-CM

## 2018-05-25 DIAGNOSIS — N189 Chronic kidney disease, unspecified: Secondary | ICD-10-CM | POA: Insufficient documentation

## 2018-05-25 DIAGNOSIS — E038 Other specified hypothyroidism: Secondary | ICD-10-CM

## 2018-05-25 DIAGNOSIS — Z885 Allergy status to narcotic agent status: Secondary | ICD-10-CM | POA: Insufficient documentation

## 2018-05-25 DIAGNOSIS — Z79899 Other long term (current) drug therapy: Secondary | ICD-10-CM | POA: Insufficient documentation

## 2018-05-25 DIAGNOSIS — Z1239 Encounter for other screening for malignant neoplasm of breast: Secondary | ICD-10-CM

## 2018-05-25 DIAGNOSIS — I129 Hypertensive chronic kidney disease with stage 1 through stage 4 chronic kidney disease, or unspecified chronic kidney disease: Secondary | ICD-10-CM | POA: Insufficient documentation

## 2018-05-25 DIAGNOSIS — Z79890 Hormone replacement therapy (postmenopausal): Secondary | ICD-10-CM | POA: Insufficient documentation

## 2018-05-25 DIAGNOSIS — I10 Essential (primary) hypertension: Secondary | ICD-10-CM

## 2018-05-25 DIAGNOSIS — K219 Gastro-esophageal reflux disease without esophagitis: Secondary | ICD-10-CM | POA: Insufficient documentation

## 2018-05-25 DIAGNOSIS — G4709 Other insomnia: Secondary | ICD-10-CM

## 2018-05-25 MED ORDER — TRAZODONE HCL 50 MG PO TABS: 50.0000 mg | ORAL_TABLET | Freq: Every evening | ORAL | 5 refills | Status: DC | PRN

## 2018-05-25 MED ORDER — CARVEDILOL 12.5 MG PO TABS: 12.5000 mg | ORAL_TABLET | Freq: Two times a day (BID) | ORAL | 5 refills | Status: DC

## 2018-05-25 MED ORDER — ATORVASTATIN CALCIUM 80 MG PO TABS: 80.0000 mg | ORAL_TABLET | Freq: Every day | ORAL | 3 refills | Status: DC

## 2018-05-25 MED ORDER — LISINOPRIL-HYDROCHLOROTHIAZIDE 10-12.5 MG PO TABS: 1.0000 | ORAL_TABLET | Freq: Every day | ORAL | 5 refills | Status: DC

## 2018-05-25 MED ORDER — LORATADINE 10 MG PO TABS: 10.0000 mg | ORAL_TABLET | Freq: Every day | ORAL | 0 refills | Status: DC | PRN

## 2018-05-25 MED ORDER — LEVOTHYROXINE SODIUM 88 MCG PO TABS: ORAL_TABLET | ORAL | 3 refills | Status: DC

## 2018-05-25 MED FILL — ?CARVEDILOL 12.5 MG TABLET: 12.5 | 30 days supply | Qty: 60 | Fill #0

## 2018-05-25 MED FILL — LEVOTHYROXINE 88 MCG TABLET: 88 | 30 days supply | Qty: 30 | Fill #0

## 2018-05-25 MED FILL — LISINOPRIL-HCTZ 10-12.5 MG: 10-12.5 | 30 days supply | Qty: 30 | Fill #0

## 2018-05-25 NOTE — Telephone Encounter (Signed)
Patient wants a call because she hasn't received anything for about the 100% discount

## 2018-05-25 NOTE — Telephone Encounter (Signed)
LVM, that her apply was review is pending now since was review in January, now we are waiting from [facility] to know if they still can used that applicators need tot a new appt and resubmit all new documents with new application, now we are waiting form a respond

## 2018-05-25 NOTE — Progress Notes (Signed)
Subjective:  Patient ID: Danielle Berger, female    DOB: [birth year]  Age: 63 y.o. MRN: [id number]  CC: Hypertension and Sore Throat   HPI Danielle Berger  is a 63 year old female with a history of previous tobacco abuse (quit in 04/2017), hypertension, hyperlipidemia, hypothyroidism who comes into the clinic for a follow-up visit.   She complains of sore throat which started yesterday and is associated with swallowing but denies sinus pressure, sinus pain,fever, otalgia, fever, rhinorrhea, cough or dyspnea.  Of note her husband had similar symptoms but now feels better.  She is compliant with her antihypertensive and statin which she takes for hyperlipidemia and denies adverse effects from her medication. Tolerating levothyroxine and her insomnia is controlled on trazodone.  Past Medical History:  Diagnosis Date  . Chest pain 07/17/2015  . Chronic renal disease   . Esophageal reflux   . Headache   . HLD (hyperlipidemia)   . Hypertension   . Hypothyroidism   . Tobacco abuse 07/17/2015    Past Surgical History:  Procedure Laterality Date  . Brain Tumor removed      when patient was in the 6th grade  . KIDNEY SURGERY     when patient was int he 8th grade    Family History  Adopted: Yes    Allergies  Allergen Reactions  . Codeine Nausea And Vomiting  . Penicillins Rash    Outpatient Medications Prior to Visit  Medication Sig Dispense Refill  . aspirin EC 81 MG EC tablet Take 1 tablet (81 mg total) by mouth daily.    Marland Kitchen HYDROcodone-homatropine (HYDROMET) 5-1.5 MG/5ML syrup Take 5 mLs by mouth every 6 (six) hours as needed for cough. 60 mL 0  . atorvastatin (LIPITOR) 80 MG tablet Take 1 tablet (80 mg total) by mouth daily. 30 tablet 3  . carvedilol (COREG) 12.5 MG tablet Take 1 tablet (12.5 mg total) by mouth 2 (two) times daily with a meal. 60 tablet 5  . levothyroxine (SYNTHROID, LEVOTHROID) 88 MCG tablet TAKE 1 TABLET BY MOUTH DAILY BEFORE BREAKFAST. 30 tablet 3  .  lisinopril-hydrochlorothiazide (PRINZIDE,ZESTORETIC) 10-12.5 MG tablet Take 1 tablet by mouth daily. 90 tablet 1  . loratadine (CLARITIN) 10 MG tablet Take 10 mg by mouth daily as needed for allergies.    . traZODone (DESYREL) 50 MG tablet Take 1 tablet (50 mg total) by mouth at bedtime as needed for sleep. 30 tablet 5   No facility-administered medications prior to visit.      ROS Review of Systems  Constitutional: Negative for activity change, appetite change and fatigue.  HENT: Positive for sore throat. Negative for congestion and sinus pressure.   Eyes: Negative for visual disturbance.  Respiratory: Negative for cough, chest tightness, shortness of breath and wheezing.   Cardiovascular: Negative for chest pain and palpitations.  Gastrointestinal: Negative for abdominal distention, abdominal pain and constipation.  Endocrine: Negative for polydipsia.  Genitourinary: Negative for dysuria and frequency.  Musculoskeletal: Negative for arthralgias and back pain.  Skin: Negative for rash.  Neurological: Negative for tremors, light-headedness and numbness.  Hematological: Does not bruise/bleed easily.  Psychiatric/Behavioral: Negative for agitation and behavioral problems.    Objective:  BP 106/68   Pulse (!) 57   Temp 98.3 F (36.8 C) (Oral)   Ht 5\' 2"  (1.575 m)   Wt 166 lb (75.3 kg)   SpO2 97%   BMI 30.36 kg/m   BP/Weight 05/25/2018 02/22/2018 91/10/9148  Systolic BP 569 794 801  Diastolic BP  68 78 81  Wt. (Lbs) 166 162.2 -  BMI 30.36 29.67 -      Physical Exam Constitutional: normal appearing,  Eyes: PERRLA HEENT: Head is atraumatic, normal sinuses, normal oropharynx, normal appearing tonsils and palate, tympanic membrane is normal bilaterally. Neck: normal range of motion, no thyromegaly, no JVD Cardiovascular: normal rate and rhythm, normal heart sounds, no murmurs, rub or gallop, no pedal edema Respiratory: Normal breath sounds, clear to auscultation bilaterally,  no wheezes, no rales, no rhonchi Abdomen: soft, not tender to palpation, normal bowel sounds, no enlarged organs Musculoskeletal: Full ROM, no tenderness in joints Skin: warm and dry, no lesions. Neurological: alert, oriented x3, cranial nerves I-XII grossly intact , normal motor strength, normal sensation. Psychological: normal mood.    CMP Latest Ref Rng & Units 02/22/2018 11/24/2017 08/21/2017  Glucose 65 - 99 mg/dL 103(H) 103(H) 106(H)  BUN 8 - 27 mg/dL 26 18 21   Creatinine 0.57 - 1.00 mg/dL 1.41(H) 1.07(H) 1.29(H)  Sodium 134 - 144 mmol/L 138 144 144  Potassium 3.5 - 5.2 mmol/L 4.9 5.1 4.9  Chloride 96 - 106 mmol/L 98 107(H) 104  CO2 20 - 29 mmol/L 23 24 20   Calcium 8.7 - 10.3 mg/dL 9.7 9.5 9.7  Total Protein 6.0 - 8.5 g/dL 7.1 6.8 7.3  Total Bilirubin 0.0 - 1.2 mg/dL 0.4 0.2 0.4  Alkaline Phos 39 - 117 IU/L 117 96 123(H)  AST 0 - 40 IU/L 22 16 18   ALT 0 - 32 IU/L 23 12 17     Lipid Panel     Component Value Date/Time   CHOL 217 (H) 11/24/2017 0945   TRIG 211 (H) 11/24/2017 0945   HDL 48 11/24/2017 0945   CHOLHDL 4.5 (H) 11/24/2017 0945   CHOLHDL 3.7 05/07/2016 0946   VLDL 34 (H) 05/07/2016 0946   LDLCALC 127 (H) 11/24/2017 0945    CBC    Component Value Date/Time   WBC 7.6 07/17/2015 1011   RBC 5.08 07/17/2015 1011   HGB 14.4 07/17/2015 1011   HCT 45.0 07/17/2015 1011   PLT 356 07/17/2015 1011   MCV 88.6 07/17/2015 1011   MCH 28.3 07/17/2015 1011   MCHC 32.0 07/17/2015 1011   RDW 13.2 07/17/2015 1011   LYMPHSABS 2.4 07/17/2015 1011   MONOABS 0.5 07/17/2015 1011   EOSABS 0.2 07/17/2015 1011   BASOSABS 0.0 07/17/2015 1011    Lab Results  Component Value Date   HGBA1C 5.8 (H) 07/17/2015    The 10-year ASCVD risk score Mikey Bussing DC Jr., et al., 2013) is: 4.7%   Values used to calculate the score:     Age: 74 years     Sex: Female     Is Non-Hispanic African American: No     Diabetic: No     Tobacco smoker: No     Systolic Blood Pressure: 409 mmHg     Is  BP treated: Yes     HDL Cholesterol: 48 mg/dL     Total Cholesterol: 217 mg/dL  Assessment & Plan:   1. Mixed hyperlipidemia Controlled ASCVD risk of 4.7 Low-cholesterol diet - atorvastatin (LIPITOR) 80 MG tablet; Take 1 tablet (80 mg total) by mouth daily.  Dispense: 30 tablet; Refill: 3  2. Essential hypertension Controlled Counseled on blood pressure goal of less than 130/80, low-sodium, DASH diet, medication compliance, 150 minutes of moderate intensity exercise per week. Discussed medication compliance, adverse effects. - carvedilol (COREG) 12.5 MG tablet; Take 1 tablet (12.5 mg total) by mouth 2 (two)  times daily with a meal.  Dispense: 60 tablet; Refill: 5 - lisinopril-hydrochlorothiazide (PRINZIDE,ZESTORETIC) 10-12.5 MG tablet; Take 1 tablet by mouth daily.  Dispense: 30 tablet; Refill: 5  3. Other specified hypothyroidism Controlled - levothyroxine (SYNTHROID, LEVOTHROID) 88 MCG tablet; TAKE 1 TABLET BY MOUTH DAILY BEFORE BREAKFAST.  Dispense: 30 tablet; Refill: 3  4. Other insomnia Controlled - traZODone (DESYREL) 50 MG tablet; Take 1 tablet (50 mg total) by mouth at bedtime as needed for sleep.  Dispense: 30 tablet; Refill: 5  5. Screening for breast cancer - MM Digital Screening; Future  6. Sore throat Likely from ostnasal drip - loratadine (CLARITIN) 10 MG tablet; Take 1 tablet (10 mg total) by mouth daily as needed for allergies.  Dispense: 30 tablet; Refill: 0   Meds ordered this encounter  Medications  . atorvastatin (LIPITOR) 80 MG tablet    Sig: Take 1 tablet (80 mg total) by mouth daily.    Dispense:  30 tablet    Refill:  3    Discontinue previous dose  . carvedilol (COREG) 12.5 MG tablet    Sig: Take 1 tablet (12.5 mg total) by mouth 2 (two) times daily with a meal.    Dispense:  60 tablet    Refill:  5  . levothyroxine (SYNTHROID, LEVOTHROID) 88 MCG tablet    Sig: TAKE 1 TABLET BY MOUTH DAILY BEFORE BREAKFAST.    Dispense:  30 tablet    Refill:   3  . lisinopril-hydrochlorothiazide (PRINZIDE,ZESTORETIC) 10-12.5 MG tablet    Sig: Take 1 tablet by mouth daily.    Dispense:  30 tablet    Refill:  5  . traZODone (DESYREL) 50 MG tablet    Sig: Take 1 tablet (50 mg total) by mouth at bedtime as needed for sleep.    Dispense:  30 tablet    Refill:  5  . loratadine (CLARITIN) 10 MG tablet    Sig: Take 1 tablet (10 mg total) by mouth daily as needed for allergies.    Dispense:  30 tablet    Refill:  0    Follow-up: Return in about 3 months (around 08/23/2018) for Follow-up of chronic medical conditions.       Charlott Rakes, MD, FAAFP. [facility] and [place], [place]   05/25/2018, 2:45 PM

## 2018-05-30 IMAGING — CT CT CHEST LUNG CANCER SCREENING LOW DOSE W/O CM
1 series · 10 of 10 positions shown, 13 images · non-contrast
Comparison: None

CLINICAL DATA: Low dose Lung cancer screening. Forty-six pack-year
history. Asymptomatic.

EXAM:
CT CHEST WITHOUT CONTRAST LOW-DOSE FOR LUNG CANCER SCREENING
TECHNIQUE: Multidetector CT imaging of the chest was performed following the
standard protocol without IV contrast.

[ct lung segmentation data · axial · 0.58mm/px · z∈[+8,+8]mm · 10 of 328 frames shown]
[frame 1/328  mediastinal]
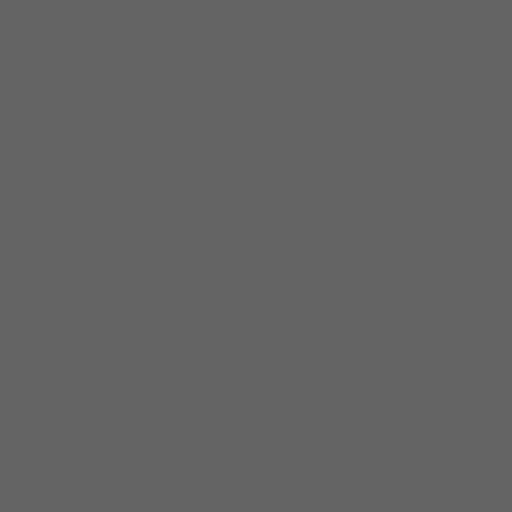
[frame 1/328  lung]
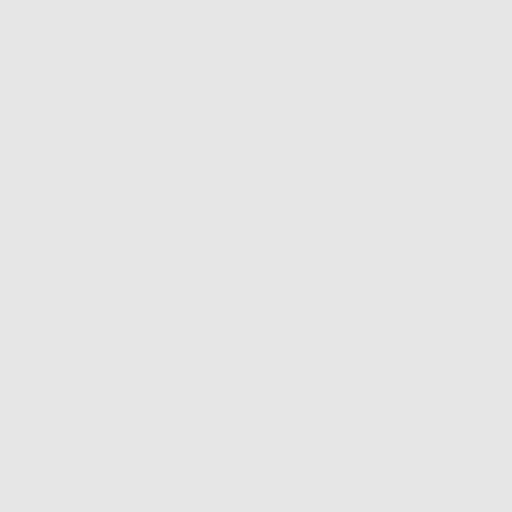
[frame 37/328  lung]
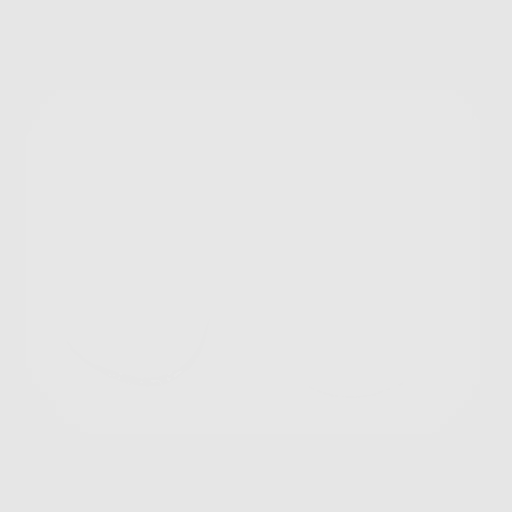
[frame 73/328  lung]
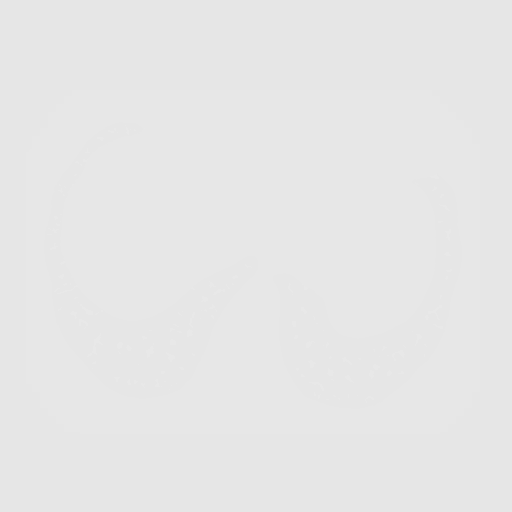
[frame 110/328  lung]
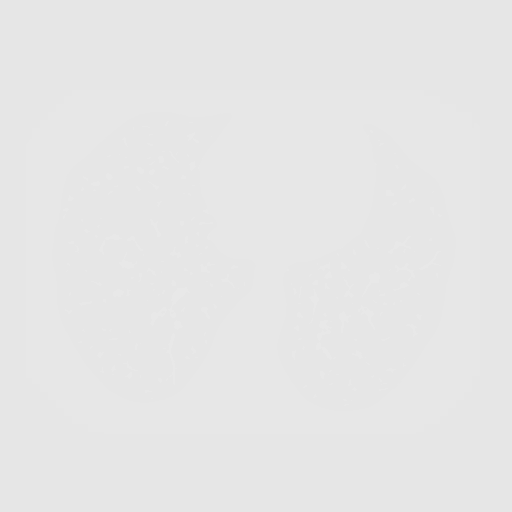
[frame 146/328  mediastinal]
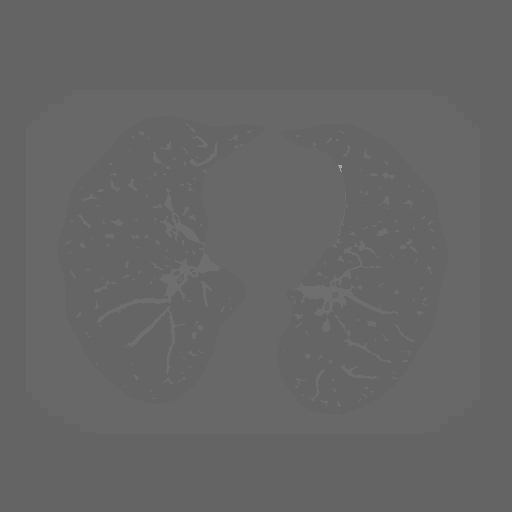
[frame 146/328  lung]
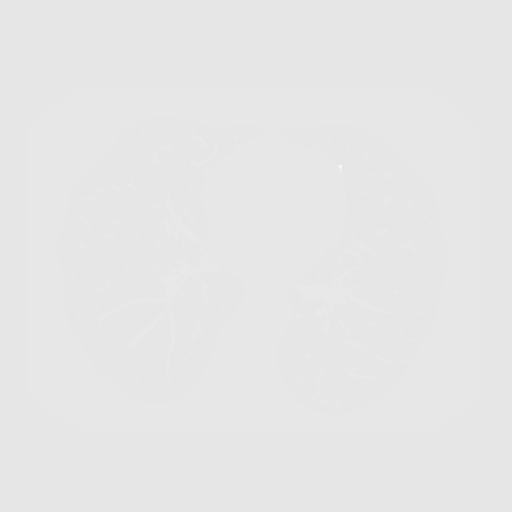
[frame 182/328  lung]
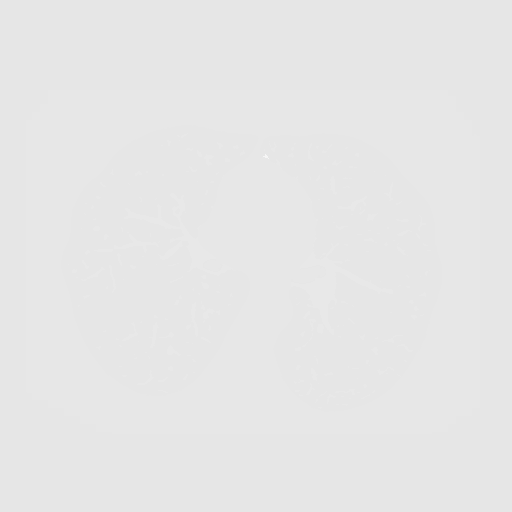
[frame 219/328  lung]
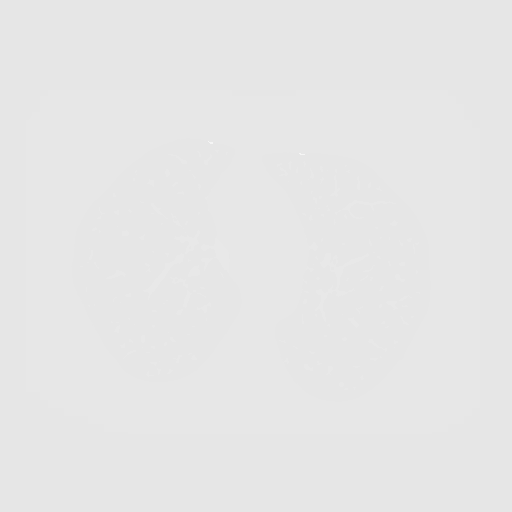
[frame 255/328  lung]
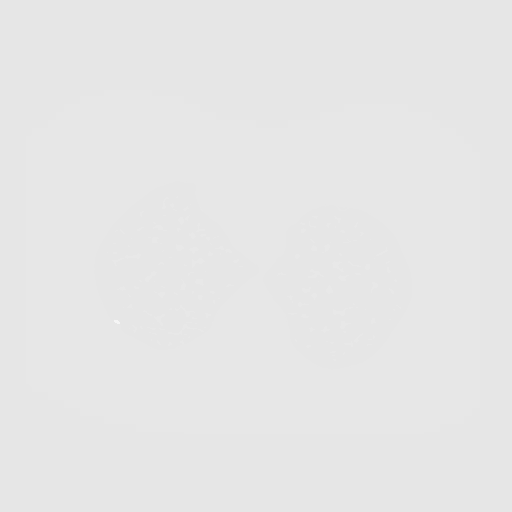
[frame 291/328  mediastinal]
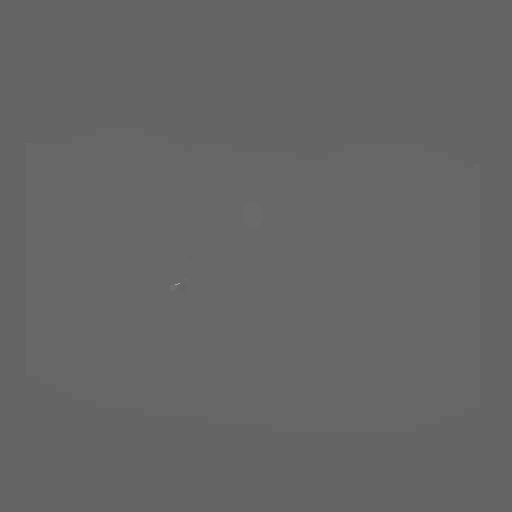
[frame 291/328  lung]
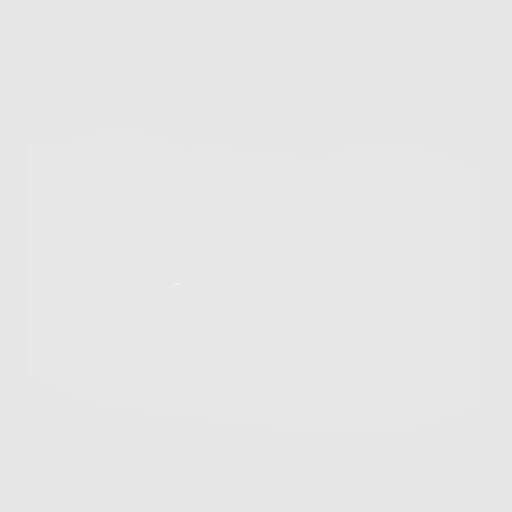
[frame 328/328  lung]
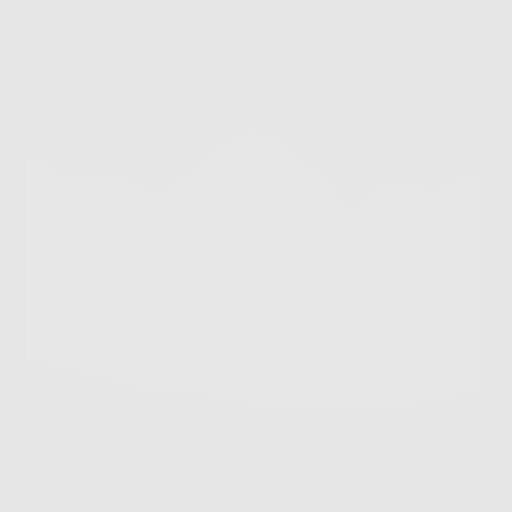

[10 of 10 positions shown; findings below may reference images not displayed]

FINDINGS: Cardiovascular: Normal heart size. No pericardial effusion. Aortic
atherosclerosis. Calcification within the RCA and LAD and left
circumflex coronary artery is noted.

Mediastinum/Nodes: No enlarged mediastinal, hilar, or axillary lymph
nodes. Thyroid gland, trachea, and esophagus demonstrate no
significant findings.

Lungs/Pleura: Mild changes of centrilobular emphysema. Perifissural
nodule in the right middle lobe has an equivalent diameter 4.1 mm.

Upper Abdomen: No acute abnormality.

Musculoskeletal: No chest wall mass or suspicious bone lesions
identified.
IMPRESSION: 1. Lung-RADS 2, benign appearance or behavior. Continue annual
screening with low-dose chest CT without contrast in 12 months.
2. Aortic Atherosclerosis (7WW9Y-Q5A.A) and Emphysema (7WW9Y-[id number].H).
3. Three vessel coronary artery calcifications.

## 2018-06-14 ENCOUNTER — Telehealth: Payer: Self-pay | Admitting: Family Medicine

## 2018-06-14 MED FILL — ATORVASTATIN 80 MG TABLET: 80 | 30 days supply | Qty: 30 | Fill #2

## 2018-06-14 NOTE — Telephone Encounter (Signed)
Pt called in would like to get help scheduling a mammogram appt she states she has tried reaching out to them to schedule one but has had no luck.

## 2018-06-15 NOTE — Telephone Encounter (Signed)
Call was placed to [facility] on patient behalf and a voicemail was left with Brooke to schedule an appointment.

## 2018-06-22 ENCOUNTER — Other Ambulatory Visit (HOSPITAL_COMMUNITY): Payer: Self-pay | Admitting: *Deleted

## 2018-06-22 DIAGNOSIS — Z1231 Encounter for screening mammogram for malignant neoplasm of breast: Secondary | ICD-10-CM

## 2018-06-24 MED FILL — LEVOTHYROXINE 88 MCG TABLET: 88 | 30 days supply | Qty: 30 | Fill #1

## 2018-06-24 MED FILL — ?CARVEDILOL 12.5 MG TABLET: 12.5 | 30 days supply | Qty: 60 | Fill #1

## 2018-06-24 MED FILL — LISINOPRIL-HCTZ 10-12.5 MG: 10-12.5 | 30 days supply | Qty: 30 | Fill #1

## 2018-07-14 MED FILL — LEVOTHYROXINE 88 MCG TABLET: 88 | 30 days supply | Qty: 30 | Fill #2

## 2018-07-14 MED FILL — ?CARVEDILOL 12.5 MG TABLET: 12.5 | 30 days supply | Qty: 60 | Fill #2

## 2018-07-14 MED FILL — ATORVASTATIN 80 MG TABLET: 80 | 30 days supply | Qty: 30 | Fill #3

## 2018-07-14 MED FILL — LISINOPRIL-HCTZ 10-12.5 MG: 10-12.5 | 30 days supply | Qty: 30 | Fill #2

## 2018-08-18 MED FILL — LISINOPRIL-HCTZ 10-12.5 MG: 10-12.5 | 30 days supply | Qty: 30 | Fill #3

## 2018-08-18 MED FILL — LEVOTHYROXINE 88 MCG TABLET: 88 | 30 days supply | Qty: 30 | Fill #3

## 2018-08-18 MED FILL — ?CARVEDILOL 12.5MG TABLET: 12.5 | 90 days supply | Qty: 180 | Fill #3

## 2018-08-18 MED FILL — ATORVASTATIN 80 MG TABLET: 80 | 30 days supply | Qty: 30 | Fill #1

## 2018-08-26 ENCOUNTER — Ambulatory Visit: Payer: Self-pay | Attending: Family Medicine | Admitting: Family Medicine

## 2018-08-26 ENCOUNTER — Other Ambulatory Visit: Payer: Self-pay

## 2018-08-26 ENCOUNTER — Encounter: Payer: Self-pay | Admitting: Family Medicine

## 2018-08-26 DIAGNOSIS — E782 Mixed hyperlipidemia: Secondary | ICD-10-CM

## 2018-08-26 DIAGNOSIS — I10 Essential (primary) hypertension: Secondary | ICD-10-CM

## 2018-08-26 DIAGNOSIS — E038 Other specified hypothyroidism: Secondary | ICD-10-CM

## 2018-08-26 MED ORDER — LISINOPRIL-HYDROCHLOROTHIAZIDE 10-12.5 MG PO TABS: 1.0000 | ORAL_TABLET | Freq: Every day | ORAL | 1 refills | Status: DC

## 2018-08-26 MED ORDER — ATORVASTATIN CALCIUM 80 MG PO TABS: 80.0000 mg | ORAL_TABLET | Freq: Every day | ORAL | 1 refills | Status: DC

## 2018-08-26 MED ORDER — CARVEDILOL 12.5 MG PO TABS: 12.5000 mg | ORAL_TABLET | Freq: Two times a day (BID) | ORAL | 1 refills | Status: DC

## 2018-08-26 MED ORDER — LEVOTHYROXINE SODIUM 88 MCG PO TABS: ORAL_TABLET | ORAL | 1 refills | Status: DC

## 2018-08-26 NOTE — Progress Notes (Signed)
Virtual Visit via Telephone Note  I connected with [name], on 08/26/2018 at 8:52 AM by telephone due to the COVID-19 pandemic and verified that I am speaking with the correct person using two identifiers.   Consent: I discussed the limitations, risks, security and privacy concerns of performing an evaluation and management service by telephone and the availability of in person appointments. I also discussed with the patient that there may be a patient responsible charge related to this service. The patient expressed understanding and agreed to proceed.   Location of Patient: Home  Location of Provider: Clinic   Persons participating in Telemedicine visit: Saharra Santo - CMA Dr Margarita Rana - PCP     History of Present Illness: Danielle Berger  is a 63 year old female with a history of previous tobacco abuse (quit in 04/2017), hypertension, hyperlipidemia, hypothyroidism who comes into the clinic for a follow-up visit.   Her pedal edema has improved ever since amlodipine was discontinued and she reports control of her blood pressure with a reading of 97/83 today.  Denies chest pains, dyspnea. Doing well on levothyroxine with no complaints of adverse effects and she tolerates her statin with no complaints of myalgias.  Last lipid panel from 02/2018 revealed elevated cholesterol. For her insomnia she uses trazodone sparingly and only on days when she has insomnia as some days she is able to sleep without it. She has no additional concerns today.   Past Medical History:  Diagnosis Date  . Chest pain 07/17/2015  . Chronic renal disease   . Esophageal reflux   . Headache   . HLD (hyperlipidemia)   . Hypertension   . Hypothyroidism   . Tobacco abuse 07/17/2015   Allergies  Allergen Reactions  . Codeine Nausea And Vomiting  . Penicillins Rash    Current Outpatient Medications on File Prior to Visit  Medication Sig Dispense Refill  . aspirin EC 81 MG EC  tablet Take 1 tablet (81 mg total) by mouth daily.    Marland Kitchen atorvastatin (LIPITOR) 80 MG tablet Take 1 tablet (80 mg total) by mouth daily. 30 tablet 3  . carvedilol (COREG) 12.5 MG tablet Take 1 tablet (12.5 mg total) by mouth 2 (two) times daily with a meal. 60 tablet 5  . HYDROcodone-homatropine (HYDROMET) 5-1.5 MG/5ML syrup Take 5 mLs by mouth every 6 (six) hours as needed for cough. 60 mL 0  . levothyroxine (SYNTHROID, LEVOTHROID) 88 MCG tablet TAKE 1 TABLET BY MOUTH DAILY BEFORE BREAKFAST. 30 tablet 3  . lisinopril-hydrochlorothiazide (PRINZIDE,ZESTORETIC) 10-12.5 MG tablet Take 1 tablet by mouth daily. 30 tablet 5  . loratadine (CLARITIN) 10 MG tablet Take 1 tablet (10 mg total) by mouth daily as needed for allergies. 30 tablet 0  . traZODone (DESYREL) 50 MG tablet Take 1 tablet (50 mg total) by mouth at bedtime as needed for sleep. 30 tablet 5   No current facility-administered medications on file prior to visit.     Observations/Objective: Awake, alert, oriented x3 Not in acute distress  CMP Latest Ref Rng & Units 02/22/2018 11/24/2017 08/21/2017  Glucose 65 - 99 mg/dL 103(H) 103(H) 106(H)  BUN 8 - 27 mg/dL 26 18 21   Creatinine 0.57 - 1.00 mg/dL 1.41(H) 1.07(H) 1.29(H)  Sodium 134 - 144 mmol/L 138 144 144  Potassium 3.5 - 5.2 mmol/L 4.9 5.1 4.9  Chloride 96 - 106 mmol/L 98 107(H) 104  CO2 20 - 29 mmol/L 23 24 20   Calcium 8.7 - 10.3 mg/dL  9.7 9.5 9.7  Total Protein 6.0 - 8.5 g/dL 7.1 6.8 7.3  Total Bilirubin 0.0 - 1.2 mg/dL 0.4 0.2 0.4  Alkaline Phos 39 - 117 IU/L 117 96 123(H)  AST 0 - 40 IU/L 22 16 18   ALT 0 - 32 IU/L 23 12 17     Lab Results  Component Value Date   TSH 2.510 02/22/2018    Lipid Panel     Component Value Date/Time   CHOL 217 (H) 11/24/2017 0945   TRIG 211 (H) 11/24/2017 0945   HDL 48 11/24/2017 0945   CHOLHDL 4.5 (H) 11/24/2017 0945   CHOLHDL 3.7 05/07/2016 0946   VLDL 34 (H) 05/07/2016 0946   LDLCALC 127 (H) 11/24/2017 0945    Assessment and  Plan: 1. Mixed hyperlipidemia Uncontrolled Needs a new lipid panel at her next visit Meanwhile continue with low-cholesterol diet, atorvastatin and lifestyle modifications I have emphasized the need to exercise 30 minutes on 5 days of the week - atorvastatin (LIPITOR) 80 MG tablet; Take 1 tablet (80 mg total) by mouth daily.  Dispense: 90 tablet; Refill: 1  2. Essential hypertension Controlled with blood pressure at home today of 97/83 -asymptomatic Consider decreasing dose of Coreg at next visit if blood pressure is still in the low range - carvedilol (COREG) 12.5 MG tablet; Take 1 tablet (12.5 mg total) by mouth 2 (two) times daily with a meal.  Dispense: 180 tablet; Refill: 1 - lisinopril-hydrochlorothiazide (ZESTORETIC) 10-12.5 MG tablet; Take 1 tablet by mouth daily.  Dispense: 90 tablet; Refill: 1  3. Other specified hypothyroidism Controlled - levothyroxine (SYNTHROID) 88 MCG tablet; TAKE 1 TABLET BY MOUTH DAILY BEFORE BREAKFAST.  Dispense: 90 tablet; Refill: 1   Fasting labs at next in person visit.  Follow Up Instructions: 3 months -advised to call for an appointment   I discussed the assessment and treatment plan with the patient. The patient was provided an opportunity to ask questions and all were answered. The patient agreed with the plan and demonstrated an understanding of the instructions.   The patient was advised to call back or seek an in-person evaluation if the symptoms worsen or if the condition fails to improve as anticipated.     I provided 16 minutes total of non-face-to-face time during this encounter including median intraservice time, reviewing previous notes, labs, imaging, medications and explaining diagnosis and management.     Charlott Rakes, MD, FAAFP. [facility] and [place], [place]   08/26/2018, 8:52 AM;

## 2018-08-26 NOTE — Progress Notes (Signed)
Patient has been called and DOB has been verified. Patient has been screened and transferred to PCP to start phone visit.     

## 2018-09-17 MED FILL — ?LEVOTHYROXINE 88 MCG TAB: 88 | 30 days supply | Qty: 30 | Fill #0

## 2018-09-17 MED FILL — LISINOPRIL-HCTZ 10-12.5 TAB: 10-12.5 | 30 days supply | Qty: 30 | Fill #4

## 2018-09-17 MED FILL — ?ATORVASTATIN 40MG TABLET: 40 | 30 days supply | Qty: 60 | Fill #0

## 2018-09-30 ENCOUNTER — Other Ambulatory Visit: Payer: Self-pay

## 2018-09-30 ENCOUNTER — Ambulatory Visit (HOSPITAL_COMMUNITY)
Admission: RE | Admit: 2018-09-30 | Discharge: 2018-09-30 | Disposition: A | Discharge status: Home / Self Care | Payer: Self-pay | Source: Ambulatory Visit | Attending: Obstetrics and Gynecology | Admitting: Obstetrics and Gynecology

## 2018-09-30 ENCOUNTER — Ambulatory Visit
Admission: RE | Admit: 2018-09-30 | Discharge: 2018-09-30 | Disposition: A | Discharge status: Home / Self Care | Payer: No Typology Code available for payment source | Source: Ambulatory Visit | Attending: Obstetrics and Gynecology | Admitting: Obstetrics and Gynecology

## 2018-09-30 ENCOUNTER — Encounter (HOSPITAL_COMMUNITY): Payer: Self-pay

## 2018-09-30 ENCOUNTER — Encounter (HOSPITAL_COMMUNITY): Payer: Self-pay | Admitting: *Deleted

## 2018-09-30 DIAGNOSIS — Z1231 Encounter for screening mammogram for malignant neoplasm of breast: Secondary | ICD-10-CM

## 2018-09-30 DIAGNOSIS — Z1239 Encounter for other screening for malignant neoplasm of breast: Secondary | ICD-10-CM | POA: Insufficient documentation

## 2018-09-30 NOTE — Progress Notes (Signed)
No complaints today.   Pap Smear: Pap smear not completed today. Last Pap smear was 01/16/2016 at [facility] and normal with negative HPV. Per patient has no history of an abnormal Pap smear. Last Pap smear result is in Epic.  Physical exam: Breasts Breasts symmetrical. No skin abnormalities bilateral breasts. No nipple retraction bilateral breasts. No nipple discharge bilateral breasts. No lymphadenopathy. No lumps palpated bilateral breasts. No complaints of pain or tenderness on exam. Referred patient to the [facility] for a screening mammogram. Appointment scheduled for Thursday, September 30, 2018 at [place].        Pelvic/Bimanual No Pap smear completed today since last Pap smear and HPV typing was 01/16/2016. Pap smear not indicated per BCCCP guidelines.   Smoking History: Patient is a current smoker. Discussed smoking cessation with patient. Referred to the [facility] Quitline and gave resources to the free smoking cessation classes at [facility].  Patient Navigation: Patient education provided. Access to services provided for patient through [place] program.   Colorectal Cancer Screening: Per patient has never had a colonoscopy completed. Patient completed a FIT Test 01/25/2018 that was negative. No complaints today.  Breast and Cervical Cancer Risk Assessment: Patient has no known family history of breast cancer, known genetic mutations, or radiation treatment to the chest before age 33. Patient has no history of cervical dysplasia, immunocompromised, or DES exposure in-utero.  Risk Assessment    Risk Scores      09/30/2018   Last edited by: Armond Hang, LPN   5-year risk: 1.4 %   Lifetime risk: 5.8 %

## 2018-09-30 NOTE — Patient Instructions (Signed)
Explained breast self awareness with Birdie Riddle. Patient did not need a Pap smear today due to last Pap smear and HPV typing was 01/16/2016. Let her know BCCCP will cover Pap smears and HPV typing every 5 years unless has a history of abnormal Pap smears. Referred patient to the [facility] for a screening mammogram. Appointment scheduled for Thursday, September 30, 2018 at [place]. Patient aware of appointment and will be there. Let patient know the [facility] will follow up with her within the next couple weeks with results of mammogram by letter or phone. Discussed smoking cessation with patient. Referred to the [facility] Quitline and gave resources to the free smoking cessation classes at [facility]. [facility] verbalized understanding.  Randy Whitener, [place], RN 9:27 AM

## 2018-10-01 ENCOUNTER — Other Ambulatory Visit: Payer: Self-pay | Admitting: Obstetrics and Gynecology

## 2018-10-01 DIAGNOSIS — R928 Other abnormal and inconclusive findings on diagnostic imaging of breast: Secondary | ICD-10-CM

## 2018-10-12 ENCOUNTER — Ambulatory Visit
Admission: RE | Admit: 2018-10-12 | Discharge: 2018-10-12 | Disposition: A | Discharge status: Home / Self Care | Payer: No Typology Code available for payment source | Source: Ambulatory Visit | Attending: Obstetrics and Gynecology | Admitting: Obstetrics and Gynecology

## 2018-10-12 ENCOUNTER — Ambulatory Visit: Payer: No Typology Code available for payment source

## 2018-10-12 ENCOUNTER — Other Ambulatory Visit: Payer: Self-pay

## 2018-10-12 DIAGNOSIS — R928 Other abnormal and inconclusive findings on diagnostic imaging of breast: Secondary | ICD-10-CM

## 2018-10-13 ENCOUNTER — Ambulatory Visit: Payer: Self-pay | Attending: Family Medicine

## 2018-10-19 MED FILL — ?ATORVASTATIN 40MG TABLET: 40 | 30 days supply | Qty: 60 | Fill #1

## 2018-10-19 MED FILL — ?LEVOTHYROXINE 88 MCG TAB: 88 | 30 days supply | Qty: 30 | Fill #1

## 2018-10-19 MED FILL — ?CARVEDILOL 12.5MG TABLET: 12.5 | 30 days supply | Qty: 60 | Fill #0

## 2018-10-19 MED FILL — LISINOPRIL-HCTZ 10-12.5 MG: 10-12.5 | 30 days supply | Qty: 30 | Fill #5

## 2018-11-01 ENCOUNTER — Ambulatory Visit: Payer: Self-pay | Attending: Family Medicine

## 2018-11-01 ENCOUNTER — Other Ambulatory Visit: Payer: Self-pay

## 2018-11-23 MED FILL — ATORVASTATIN 80 MG TABLET: 80 | 30 days supply | Qty: 30 | Fill #0

## 2018-11-23 MED FILL — LISINOPRIL-HCTZ 10-12.5 MG: 10-12.5 | 30 days supply | Qty: 30 | Fill #0

## 2018-11-23 MED FILL — LEVOTHYROXINE 88 MCG TABLET: 88 | 30 days supply | Qty: 30 | Fill #2

## 2018-11-23 MED FILL — ?CARVEDILOL 12.5 MG TABLET: 12.5 | 30 days supply | Qty: 60 | Fill #1

## 2018-12-13 ENCOUNTER — Ambulatory Visit: Payer: Self-pay | Attending: Family Medicine | Admitting: Family Medicine

## 2018-12-13 ENCOUNTER — Encounter: Payer: Self-pay | Admitting: Family Medicine

## 2018-12-13 ENCOUNTER — Other Ambulatory Visit: Payer: Self-pay

## 2018-12-13 DIAGNOSIS — E782 Mixed hyperlipidemia: Secondary | ICD-10-CM

## 2018-12-13 DIAGNOSIS — Z131 Encounter for screening for diabetes mellitus: Secondary | ICD-10-CM

## 2018-12-13 DIAGNOSIS — I10 Essential (primary) hypertension: Secondary | ICD-10-CM

## 2018-12-13 DIAGNOSIS — E038 Other specified hypothyroidism: Secondary | ICD-10-CM

## 2018-12-13 MED ORDER — ATORVASTATIN CALCIUM 80 MG PO TABS: 80.0000 mg | ORAL_TABLET | Freq: Every day | ORAL | 6 refills | Status: DC

## 2018-12-13 MED ORDER — CARVEDILOL 12.5 MG PO TABS: 12.5000 mg | ORAL_TABLET | Freq: Two times a day (BID) | ORAL | 6 refills | Status: DC

## 2018-12-13 MED ORDER — LISINOPRIL-HYDROCHLOROTHIAZIDE 10-12.5 MG PO TABS: 1.0000 | ORAL_TABLET | Freq: Every day | ORAL | 6 refills | Status: DC

## 2018-12-13 NOTE — Progress Notes (Signed)
Patient has been called and DOB has been verified. Patient has been screened and transferred to PCP to start phone visit.     

## 2018-12-13 NOTE — Progress Notes (Signed)
Virtual Visit via Telephone Note  I connected with Danielle Berger, on 12/13/2018 at 3:21 PM by telephone due to the COVID-19 pandemic and verified that I am speaking with the correct person using two identifiers.   Consent: I discussed the limitations, risks, security and privacy concerns of performing an evaluation and management service by telephone and the availability of in person appointments. I also discussed with the patient that there may be a patient responsible charge related to this service. The patient expressed understanding and agreed to proceed.   Location of Patient: Home  Location of Provider: Clinic   Persons participating in Telemedicine visit: Oreta Soloway Farrington-CMA Dr. Felecia Shelling     History of Present Illness: Danielle Berger  is a 63-year-old female with a history of previous tobacco abuse (quit in 04/2017), hypertension, hyperlipidemia, hypothyroidism who comes into the clinic for a follow-up visit.  She has no concerns today and is doing well on all her medications.  Endorses exercising regularly.  She is due for blood work today and is also due for colonoscopy and will be due for Pap smear in 01/2019. She recently had a mammogram 2 months ago which was negative for malignancy.  Also interested in receiving the flu shot.  Past Medical History:  Diagnosis Date  . Chest pain 07/17/2015  . Chronic renal disease   . Esophageal reflux   . Headache   . HLD (hyperlipidemia)   . Hypertension   . Hypothyroidism   . Tobacco abuse 07/17/2015   Allergies  Allergen Reactions  . Codeine Nausea And Vomiting  . Penicillins Rash    Current Outpatient Medications on File Prior to Visit  Medication Sig Dispense Refill  . aspirin EC 81 MG EC tablet Take 1 tablet (81 mg total) by mouth daily.    Marland Kitchen atorvastatin (LIPITOR) 80 MG tablet Take 1 tablet (80 mg total) by mouth daily. 90 tablet 1  . carvedilol (COREG) 12.5 MG tablet Take 1 tablet (12.5  mg total) by mouth 2 (two) times daily with a meal. 180 tablet 1  . levothyroxine (SYNTHROID) 88 MCG tablet TAKE 1 TABLET BY MOUTH DAILY BEFORE BREAKFAST. 90 tablet 1  . lisinopril-hydrochlorothiazide (ZESTORETIC) 10-12.5 MG tablet Take 1 tablet by mouth daily. 90 tablet 1  . loratadine (CLARITIN) 10 MG tablet Take 1 tablet (10 mg total) by mouth daily as needed for allergies. 30 tablet 0  . traZODone (DESYREL) 50 MG tablet Take 1 tablet (50 mg total) by mouth at bedtime as needed for sleep. 30 tablet 5  . HYDROcodone-homatropine (HYDROMET) 5-1.5 MG/5ML syrup Take 5 mLs by mouth every 6 (six) hours as needed for cough. (Patient not taking: Reported on 09/30/2018) 60 mL 0   No current facility-administered medications on file prior to visit.     Observations/Objective: Awake, alert, oriented x3 Not in acute distress   CMP Latest Ref Rng & Units 02/22/2018 11/24/2017 08/21/2017  Glucose 65 - 99 mg/dL 103(H) 103(H) 106(H)  BUN 8 - 27 mg/dL '26 18 21  ' Creatinine 0.57 - 1.00 mg/dL 1.41(H) 1.07(H) 1.29(H)  Sodium 134 - 144 mmol/L 138 144 144  Potassium 3.5 - 5.2 mmol/L 4.9 5.1 4.9  Chloride 96 - 106 mmol/L 98 107(H) 104  CO2 20 - 29 mmol/L '23 24 20  ' Calcium 8.7 - 10.3 mg/dL 9.7 9.5 9.7  Total Protein 6.0 - 8.5 g/dL 7.1 6.8 7.3  Total Bilirubin 0.0 - 1.2 mg/dL 0.4 0.2 0.4  Alkaline Phos 39 - 117  IU/L 117 96 123(H)  AST 0 - 40 IU/L '22 16 18  ' ALT 0 - 32 IU/L '23 12 17    ' Lipid Panel     Component Value Date/Time   CHOL 217 (H) 11/24/2017 0945   TRIG 211 (H) 11/24/2017 0945   HDL 48 11/24/2017 0945   CHOLHDL 4.5 (H) 11/24/2017 0945   CHOLHDL 3.7 05/07/2016 0946   VLDL 34 (H) 05/07/2016 0946   LDLCALC 127 (H) 11/24/2017 0945     Assessment and Plan: 1. Screening for diabetes mellitus - Hemoglobin A1c; Future  2. Mixed hyperlipidemia Uncontrolled Low-cholesterol diet, lifestyle modifications We will repeat lipid panel - CMP14+EGFR; Future - Lipid panel; Future - atorvastatin  (LIPITOR) 80 MG tablet; Take 1 tablet (80 mg total) by mouth daily.  Dispense: 30 tablet; Refill: 6  3. Other specified hypothyroidism Controlled - T4, free; Future - TSH; Future  4. Essential hypertension Controlled Counseled on blood pressure goal of less than 130/80, low-sodium, DASH diet, medication compliance, 150 minutes of moderate intensity exercise per week. Discussed medication compliance, adverse effects. - lisinopril-hydrochlorothiazide (ZESTORETIC) 10-12.5 MG tablet; Take 1 tablet by mouth daily.  Dispense: 30 tablet; Refill: 6 - carvedilol (COREG) 12.5 MG tablet; Take 1 tablet (12.5 mg total) by mouth 2 (two) times daily with a meal.  Dispense: 60 tablet; Refill: 6  She will come in tomorrow for fasting labs as well as a flu shot.  Follow Up Instructions: Return in about 6 weeks (around 01/24/2019) for annual physical.    I discussed the assessment and treatment plan with the patient. The patient was provided an opportunity to ask questions and all were answered. The patient agreed with the plan and demonstrated an understanding of the instructions.   The patient was advised to call back or seek an in-person evaluation if the symptoms worsen or if the condition fails to improve as anticipated.     I provided 15 minutes total of non-face-to-face time during this encounter including median intraservice time, reviewing previous notes, labs, imaging, medications, management and patient verbalized understanding.     Charlott Rakes, MD, FAAFP. [facility] and [place], [employer]   12/13/2018, 3:21 PM

## 2018-12-14 ENCOUNTER — Other Ambulatory Visit: Payer: Self-pay

## 2018-12-14 ENCOUNTER — Ambulatory Visit: Payer: Self-pay | Attending: Family Medicine

## 2018-12-14 DIAGNOSIS — E038 Other specified hypothyroidism: Secondary | ICD-10-CM

## 2018-12-14 DIAGNOSIS — E782 Mixed hyperlipidemia: Secondary | ICD-10-CM

## 2018-12-14 DIAGNOSIS — Z131 Encounter for screening for diabetes mellitus: Secondary | ICD-10-CM

## 2018-12-15 ENCOUNTER — Telehealth: Payer: Self-pay

## 2018-12-15 LAB — CMP14+EGFR
ALT: 20 IU/L (ref 0–32)
AST: 23 IU/L (ref 0–40)
Albumin/Globulin Ratio: 2.3 — ABNORMAL HIGH (ref 1.2–2.2)
Albumin: 4.8 g/dL (ref 3.8–4.8)
Alkaline Phosphatase: 95 IU/L (ref 39–117)
BUN/Creatinine Ratio: 16 (ref 12–28)
BUN: 29 mg/dL — ABNORMAL HIGH (ref 8–27)
Bilirubin Total: 0.3 mg/dL (ref 0.0–1.2)
CO2: 24 mmol/L (ref 20–29)
Calcium: 9.2 mg/dL (ref 8.7–10.3)
Chloride: 99 mmol/L (ref 96–106)
Creatinine, Ser: 1.87 mg/dL — ABNORMAL HIGH (ref 0.57–1.00)
GFR calc Af Amer: 32 mL/min/{1.73_m2} — ABNORMAL LOW (ref >59)
GFR calc non Af Amer: 28 mL/min/{1.73_m2} — ABNORMAL LOW (ref >59)
Globulin, Total: 2.1 g/dL (ref 1.5–4.5)
Glucose: 103 mg/dL — ABNORMAL HIGH (ref 65–99)
Potassium: 5 mmol/L (ref 3.5–5.2)
Sodium: 137 mmol/L (ref 134–144)
Total Protein: 6.9 g/dL (ref 6.0–8.5)

## 2018-12-15 LAB — T4, FREE: Free T4: 1.77 ng/dL (ref 0.82–1.77)

## 2018-12-15 LAB — LIPID PANEL
Chol/HDL Ratio: 3.7 ratio (ref 0.0–4.4)
Cholesterol, Total: 181 mg/dL (ref 100–199)
HDL: 49 mg/dL (ref >39)
LDL Chol Calc (NIH): 105 mg/dL — ABNORMAL HIGH (ref 0–99)
Triglycerides: 154 mg/dL — ABNORMAL HIGH (ref 0–149)
VLDL Cholesterol Cal: 27 mg/dL (ref 5–40)

## 2018-12-15 LAB — HEMOGLOBIN A1C
Est. average glucose Bld gHb Est-mCnc: 120 mg/dL
Hgb A1c MFr Bld: 5.8 % — ABNORMAL HIGH (ref 4.8–5.6)

## 2018-12-15 LAB — TSH: TSH: 1.23 u[IU]/mL (ref 0.450–4.500)

## 2018-12-15 NOTE — Telephone Encounter (Signed)
Patient name and DOB has been verified Patient was informed of lab results. Patient had no questions.  

## 2018-12-15 NOTE — Telephone Encounter (Signed)
-----   Message from Charlott Rakes, MD sent at 12/15/2018  1:38 PM EDT ----- Thyroid is normal, continue current dose of levothyroxine.  Cholesterol has improved significantly compared to 1 year ago.  Kidney function is slightly abnormal compared to previous lab.  Advised to avoid NSAIDs and we will repeat her kidney function at her next visit.

## 2018-12-22 MED FILL — LEVOTHYROXINE 88 MCG TABLET: 88 | 30 days supply | Qty: 30 | Fill #3

## 2018-12-22 MED FILL — ATORVASTATIN 80 MG TABLET: 80 | 30 days supply | Qty: 30 | Fill #1

## 2018-12-22 MED FILL — LISINOPRIL-HCTZ 10-12.5 MG: 10-12.5 | 30 days supply | Qty: 30 | Fill #1

## 2019-01-19 MED FILL — LISINOPRIL-HCTZ 10-12.5 MG: 10-12.5 | 30 days supply | Qty: 30 | Fill #2

## 2019-01-19 MED FILL — ATORVASTATIN 80 MG TABLET: 80 | 30 days supply | Qty: 30 | Fill #2

## 2019-01-19 MED FILL — ?CARVEDILOL 12.5 MG TABLET: 12.5 | 30 days supply | Qty: 60 | Fill #2

## 2019-01-19 MED FILL — LEVOTHYROXINE 88 MCG TABLET: 88 | 30 days supply | Qty: 30 | Fill #4

## 2019-01-31 ENCOUNTER — Other Ambulatory Visit: Payer: Self-pay

## 2019-01-31 ENCOUNTER — Encounter: Payer: Self-pay | Admitting: Family Medicine

## 2019-01-31 ENCOUNTER — Ambulatory Visit: Payer: Self-pay | Attending: Family Medicine | Admitting: Family Medicine

## 2019-01-31 VITALS — BP 123/73 | HR 54 | Temp 98.2°F | Ht 62.0 in | Wt 157.8 lb

## 2019-01-31 DIAGNOSIS — Z1211 Encounter for screening for malignant neoplasm of colon: Secondary | ICD-10-CM

## 2019-01-31 DIAGNOSIS — Z124 Encounter for screening for malignant neoplasm of cervix: Secondary | ICD-10-CM

## 2019-01-31 DIAGNOSIS — Z0000 Encounter for general adult medical examination without abnormal findings: Secondary | ICD-10-CM

## 2019-01-31 NOTE — Progress Notes (Signed)
Subjective:  Patient ID: Danielle Berger, female    DOB: 08/12/1955  Age: 63 y.o. MRN: [id number]  CC: Annual Exam and Gynecologic Exam   HPI Danielle Berger is a 63 year old female presents for complete physical exam Last Pap smear was in 2017 and was normal She did have a mammogram and left breast ultrasound in 09/2018 with no evidence of malignancy. She is due for colonoscopy. Currently smokes.  Quit in the past but relapsed again however she has been without a cigarette for the last 1 month..  Past Medical History:  Diagnosis Date  . Chest pain 07/17/2015  . Chronic renal disease   . Esophageal reflux   . Headache   . HLD (hyperlipidemia)   . Hypertension   . Hypothyroidism   . Tobacco abuse 07/17/2015    Past Surgical History:  Procedure Laterality Date  . Brain Tumor removed      when patient was in the 6th grade  . KIDNEY SURGERY     when patient was int he 8th grade    Family History  Adopted: Yes    Allergies  Allergen Reactions  . Codeine Nausea And Vomiting  . Penicillins Rash    Outpatient Medications Prior to Visit  Medication Sig Dispense Refill  . aspirin EC 81 MG EC tablet Take 1 tablet (81 mg total) by mouth daily.    Marland Kitchen atorvastatin (LIPITOR) 80 MG tablet Take 1 tablet (80 mg total) by mouth daily. 30 tablet 6  . carvedilol (COREG) 12.5 MG tablet Take 1 tablet (12.5 mg total) by mouth 2 (two) times daily with a meal. 60 tablet 6  . levothyroxine (SYNTHROID) 88 MCG tablet TAKE 1 TABLET BY MOUTH DAILY BEFORE BREAKFAST. 90 tablet 1  . lisinopril-hydrochlorothiazide (ZESTORETIC) 10-12.5 MG tablet Take 1 tablet by mouth daily. 30 tablet 6  . loratadine (CLARITIN) 10 MG tablet Take 1 tablet (10 mg total) by mouth daily as needed for allergies. 30 tablet 0  . traZODone (DESYREL) 50 MG tablet Take 1 tablet (50 mg total) by mouth at bedtime as needed for sleep. 30 tablet 5  . HYDROcodone-homatropine (HYDROMET) 5-1.5 MG/5ML syrup Take 5 mLs by mouth  every 6 (six) hours as needed for cough. (Patient not taking: Reported on 09/30/2018) 60 mL 0   No facility-administered medications prior to visit.      ROS Review of Systems  Constitutional: Negative for activity change, appetite change and fatigue.  HENT: Negative for congestion, sinus pressure and sore throat.   Eyes: Negative for visual disturbance.  Respiratory: Negative for cough, chest tightness, shortness of breath and wheezing.   Cardiovascular: Negative for chest pain and palpitations.  Gastrointestinal: Negative for abdominal distention, abdominal pain and constipation.  Endocrine: Negative for polydipsia.  Genitourinary: Negative for dysuria and frequency.  Musculoskeletal: Negative for arthralgias and back pain.  Skin: Negative for rash.  Neurological: Negative for tremors, light-headedness and numbness.  Hematological: Does not bruise/bleed easily.  Psychiatric/Behavioral: Negative for agitation and behavioral problems.    Objective:  BP 123/73   Pulse (!) 54   Temp 98.2 F (36.8 C) (Oral)   Ht 5\' 2"  (1.575 m)   Wt 157 lb 12.8 oz (71.6 kg)   SpO2 99%   BMI 28.86 kg/m   BP/Weight 01/31/2019 09/30/2018 [id number]  Systolic BP [id number]  Diastolic BP 73 78 68  Wt. (Lbs) 157.8 163 166  BMI 28.86 29.81 30.36      Physical Exam Constitutional:  General: She is not in acute distress.    Appearance: She is well-developed. She is not diaphoretic.  HENT:     Head: Normocephalic.     Right Ear: External ear normal.     Left Ear: External ear normal.     Nose: Nose normal.  Eyes:     Conjunctiva/sclera: Conjunctivae normal.     Pupils: Pupils are equal, round, and reactive to light.  Neck:     Musculoskeletal: Normal range of motion.     Vascular: No JVD.  Cardiovascular:     Rate and Rhythm: Normal rate and regular rhythm.     Heart sounds: Normal heart sounds. No murmur. No gallop.   Pulmonary:     Effort: Pulmonary effort is normal. No  respiratory distress.     Breath sounds: Normal breath sounds. No wheezing or rales.  Chest:     Chest wall: No tenderness.     Breasts:        Right: Normal. No mass or tenderness.        Left: Normal. No mass or tenderness.  Abdominal:     General: Bowel sounds are normal. There is no distension.     Palpations: Abdomen is soft. There is no mass.     Tenderness: There is no abdominal tenderness.  Genitourinary:    Comments: Uterine prolapse noted Difficulty locating the cervix Vaginal Pap smear performed Musculoskeletal: Normal range of motion.        General: No tenderness.  Skin:    General: Skin is warm and dry.  Neurological:     Mental Status: She is alert and oriented to person, place, and time.     Deep Tendon Reflexes: Reflexes are normal and symmetric.     CMP Latest Ref Rng & Units 12/14/2018 02/22/2018 11/24/2017  Glucose 65 - 99 mg/dL 103(H) 103(H) 103(H)  BUN 8 - 27 mg/dL 29(H) 26 18  Creatinine 0.57 - 1.00 mg/dL 1.87(H) 1.41(H) 1.07(H)  Sodium 134 - 144 mmol/L 137 138 144  Potassium 3.5 - 5.2 mmol/L 5.0 4.9 5.1  Chloride 96 - 106 mmol/L 99 98 107(H)  CO2 20 - 29 mmol/L 24 23 24   Calcium 8.7 - 10.3 mg/dL 9.2 9.7 9.5  Total Protein 6.0 - 8.5 g/dL 6.9 7.1 6.8  Total Bilirubin 0.0 - 1.2 mg/dL 0.3 0.4 0.2  Alkaline Phos 39 - 117 IU/L 95 117 96  AST 0 - 40 IU/L 23 22 16   ALT 0 - 32 IU/L 20 23 12     Lipid Panel     Component Value Date/Time   CHOL 181 12/14/2018 1006   TRIG 154 (H) 12/14/2018 1006   HDL 49 12/14/2018 1006   CHOLHDL 3.7 12/14/2018 1006   CHOLHDL 3.7 05/07/2016 0946   VLDL 34 (H) 05/07/2016 0946   LDLCALC 105 (H) 12/14/2018 1006    CBC    Component Value Date/Time   WBC 7.6 07/17/2015 1011   RBC 5.08 07/17/2015 1011   HGB 14.4 07/17/2015 1011   HCT 45.0 07/17/2015 1011   PLT 356 07/17/2015 1011   MCV 88.6 07/17/2015 1011   MCH 28.3 07/17/2015 1011   MCHC 32.0 07/17/2015 1011   RDW 13.2 07/17/2015 1011   LYMPHSABS 2.4 07/17/2015  1011   MONOABS 0.5 07/17/2015 1011   EOSABS 0.2 07/17/2015 1011   BASOSABS 0.0 07/17/2015 1011    Lab Results  Component Value Date   HGBA1C 5.8 (H) 12/14/2018    Assessment & Plan:  1. Annual physical exam Counseled on 150 minutes of exercise per week, healthy eating (including decreased daily intake of saturated fats, cholesterol, added sugars, sodium), STI prevention, routine healthcare maintenance.   2. Screening for cervical cancer - Cytology - PAP([place])  3. Screening for colon cancer - Ambulatory referral to Gastroenterology  4.  Tobacco abuse Spent 3 minutes counseling on smoking cessation and she is working on quitting.  She has been without cigarettes for the last 1 month but states the pandemic has caused her to relapse.  No orders of the defined types were placed in this encounter.   Follow-up: Return in about 6 months (around 08/01/2019) for medical conditions.       Charlott Rakes, MD, FAAFP. [facility] and [place], [place]   01/31/2019, 11:15 AM

## 2019-01-31 NOTE — Patient Instructions (Signed)
Health Maintenance, Female Adopting a healthy lifestyle and getting preventive care are important in promoting health and wellness. Ask your health care provider about:  The right schedule for you to have regular tests and exams.  Things you can do on your own to prevent diseases and keep yourself healthy. What should I know about diet, weight, and exercise? Eat a healthy diet   Eat a diet that includes plenty of vegetables, fruits, low-fat dairy products, and lean protein.  Do not eat a lot of foods that are high in solid fats, added sugars, or sodium. Maintain a healthy weight Body mass index (BMI) is used to identify weight problems. It estimates body fat based on height and weight. Your health care provider can help determine your BMI and help you achieve or maintain a healthy weight. Get regular exercise Get regular exercise. This is one of the most important things you can do for your health. Most adults should:  Exercise for at least 150 minutes each week. The exercise should increase your heart rate and make you sweat (moderate-intensity exercise).  Do strengthening exercises at least twice a week. This is in addition to the moderate-intensity exercise.  Spend less time sitting. Even light physical activity can be beneficial. Watch cholesterol and blood lipids Have your blood tested for lipids and cholesterol at 64 years of age, then have this test every 5 years. Have your cholesterol levels checked more often if:  Your lipid or cholesterol levels are high.  You are older than 64 years of age.  You are at high risk for heart disease. What should I know about cancer screening? Depending on your health history and family history, you may need to have cancer screening at various ages. This may include screening for:  Breast cancer.  Cervical cancer.  Colorectal cancer.  Skin cancer.  Lung cancer. What should I know about heart disease, diabetes, and high blood  pressure? Blood pressure and heart disease  High blood pressure causes heart disease and increases the risk of stroke. This is more likely to develop in people who have high blood pressure readings, are of African descent, or are overweight.  Have your blood pressure checked: ? Every 3-5 years if you are 18-39 years of age. ? Every year if you are 40 years old or older. Diabetes Have regular diabetes screenings. This checks your fasting blood sugar level. Have the screening done:  Once every three years after age 40 if you are at a normal weight and have a low risk for diabetes.  More often and at a younger age if you are overweight or have a high risk for diabetes. What should I know about preventing infection? Hepatitis B If you have a higher risk for hepatitis B, you should be screened for this virus. Talk with your health care provider to find out if you are at risk for hepatitis B infection. Hepatitis C Testing is recommended for:  Everyone born from 1945 through 1965.  Anyone with known risk factors for hepatitis C. Sexually transmitted infections (STIs)  Get screened for STIs, including gonorrhea and chlamydia, if: ? You are sexually active and are younger than 64 years of age. ? You are older than 64 years of age and your health care provider tells you that you are at risk for this type of infection. ? Your sexual activity has changed since you were last screened, and you are at increased risk for chlamydia or gonorrhea. Ask your health care provider if   you are at risk.  Ask your health care provider about whether you are at high risk for HIV. Your health care provider may recommend a prescription medicine to help prevent HIV infection. If you choose to take medicine to prevent HIV, you should first get tested for HIV. You should then be tested every 3 months for as long as you are taking the medicine. Pregnancy  If you are about to stop having your period (premenopausal) and  you may become pregnant, seek counseling before you get pregnant.  Take 400 to 800 micrograms (mcg) of folic acid every day if you become pregnant.  Ask for birth control (contraception) if you want to prevent pregnancy. Osteoporosis and menopause Osteoporosis is a disease in which the bones lose minerals and strength with aging. This can result in bone fractures. If you are 65 years old or older, or if you are at risk for osteoporosis and fractures, ask your health care provider if you should:  Be screened for bone loss.  Take a calcium or vitamin D supplement to lower your risk of fractures.  Be given hormone replacement therapy (HRT) to treat symptoms of menopause. Follow these instructions at home: Lifestyle  Do not use any products that contain nicotine or tobacco, such as cigarettes, e-cigarettes, and chewing tobacco. If you need help quitting, ask your health care provider.  Do not use street drugs.  Do not share needles.  Ask your health care provider for help if you need support or information about quitting drugs. Alcohol use  Do not drink alcohol if: ? Your health care provider tells you not to drink. ? You are pregnant, may be pregnant, or are planning to become pregnant.  If you drink alcohol: ? Limit how much you use to 0-1 drink a day. ? Limit intake if you are breastfeeding.  Be aware of how much alcohol is in your drink. In the [place] one drink equals one 12 oz bottle of beer (355 mL), one 5 oz glass of wine (148 mL), or one 1 oz glass of hard liquor (44 mL). General instructions  Schedule regular health, dental, and eye exams.  Stay current with your vaccines.  Tell your health care provider if: ? You often feel depressed. ? You have ever been abused or do not feel safe at home. Summary  Adopting a healthy lifestyle and getting preventive care are important in promoting health and wellness.  Follow your health care provider's instructions about healthy  diet, exercising, and getting tested or screened for diseases.  Follow your health care provider's instructions on monitoring your cholesterol and blood pressure. This information is not intended to replace advice given to you by your health care provider. Make sure you discuss any questions you have with your health care provider. Document Released: 10/14/2010 Document Revised: 03/24/2018 Document Reviewed: 03/24/2018 Elsevier Patient Education  2020 [employer]vier Inc.  

## 2019-02-01 ENCOUNTER — Encounter: Payer: Self-pay | Admitting: Gastroenterology

## 2019-02-04 ENCOUNTER — Telehealth: Payer: Self-pay

## 2019-02-04 LAB — CYTOLOGY - PAP
Adequacy: ABSENT
Comment: NEGATIVE
Comment: NEGATIVE
Comment: NEGATIVE
Diagnosis: NEGATIVE
HPV 16: NEGATIVE
HPV 18 / 45: NEGATIVE
High risk HPV: POSITIVE — AB

## 2019-02-04 NOTE — Telephone Encounter (Signed)
Patient name and DOB has been verified Patient was informed of lab results. Patient had no questions.  

## 2019-02-04 NOTE — Telephone Encounter (Signed)
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

## 2019-02-15 ENCOUNTER — Ambulatory Visit (AMBULATORY_SURGERY_CENTER): Payer: Self-pay | Admitting: *Deleted

## 2019-02-15 ENCOUNTER — Other Ambulatory Visit: Payer: Self-pay

## 2019-02-15 VITALS — Temp 96.8°F | Ht 62.0 in | Wt 156.4 lb

## 2019-02-15 DIAGNOSIS — Z1211 Encounter for screening for malignant neoplasm of colon: Secondary | ICD-10-CM

## 2019-02-15 NOTE — Progress Notes (Signed)
No egg or soy allergy known to patient  No issues with past sedation with any surgeries  or procedures, no intubation problems  No diet pills per patient No home 02 use per patient  No blood thinners per patient  Pt denies issues with constipation  No A fib or A flutter  EMMI video sent to pt's e mail   Due to the COVID-19 pandemic we are asking patients to follow these guidelines. Please only bring one care partner. Please be aware that your care partner may wait in the car in the parking lot or if they feel like they will be too hot to wait in the car, they may wait in the lobby on the 4th floor. All care partners are required to wear a mask the entire time (we do not have any that we can provide them), they need to practice social distancing, and we will do a Covid check for all patient's and care partners when you arrive. Also we will check their temperature and your temperature. If the care partner waits in their car they need to stay in the parking lot the entire time and we will call them on their cell phone when the patient is ready for discharge so they can bring the car to the front of the building. Also all patient's will need to wear a mask into building.  SUPREP SAMPLE PROVIDED.  COVID SCREENING 02/23/19,11:40 AM

## 2019-02-18 MED FILL — LEVOTHYROXINE 88 MCG TABLET: 88 | 30 days supply | Qty: 30 | Fill #5

## 2019-02-18 MED FILL — LISINOPRIL-HCTZ 10-12.5 MG: 10-12.5 | 30 days supply | Qty: 30 | Fill #3

## 2019-02-18 MED FILL — ATORVASTATIN 80 MG TABLET: 80 | 30 days supply | Qty: 30 | Fill #3

## 2019-02-18 MED FILL — ?CARVEDILOL 12.5 MG TABLET: 12.5 | 30 days supply | Qty: 60 | Fill #3

## 2019-02-23 ENCOUNTER — Ambulatory Visit (INDEPENDENT_AMBULATORY_CARE_PROVIDER_SITE_OTHER): Payer: Self-pay

## 2019-02-23 ENCOUNTER — Other Ambulatory Visit: Payer: Self-pay | Admitting: Gastroenterology

## 2019-02-23 DIAGNOSIS — Z1159 Encounter for screening for other viral diseases: Secondary | ICD-10-CM

## 2019-02-24 LAB — SARS CORONAVIRUS 2 (TAT 6-24 HRS): SARS Coronavirus 2: NEGATIVE

## 2019-02-28 ENCOUNTER — Other Ambulatory Visit: Payer: Self-pay

## 2019-02-28 ENCOUNTER — Ambulatory Visit (AMBULATORY_SURGERY_CENTER): Payer: Self-pay | Admitting: Gastroenterology

## 2019-02-28 ENCOUNTER — Encounter: Payer: Self-pay | Admitting: Gastroenterology

## 2019-02-28 VITALS — BP 122/80 | HR 80 | Temp 97.6°F | Resp 20 | Ht 62.0 in | Wt 156.0 lb

## 2019-02-28 DIAGNOSIS — K635 Polyp of colon: Secondary | ICD-10-CM

## 2019-02-28 DIAGNOSIS — D127 Benign neoplasm of rectosigmoid junction: Secondary | ICD-10-CM

## 2019-02-28 DIAGNOSIS — Z1211 Encounter for screening for malignant neoplasm of colon: Secondary | ICD-10-CM

## 2019-02-28 DIAGNOSIS — D125 Benign neoplasm of sigmoid colon: Secondary | ICD-10-CM

## 2019-02-28 MED ORDER — SODIUM CHLORIDE 0.9 % IV SOLN: 500.0000 mL | INTRAVENOUS | Status: DC

## 2019-02-28 NOTE — Patient Instructions (Signed)
2 polyps removed. Hemorrhoids non-bleeding Repeat colonoscopy in 3 years     YOU HAD AN ENDOSCOPIC PROCEDURE TODAY AT [place]:   Refer to the procedure report that was given to you for any specific questions about what was found during the examination.  If the procedure report does not answer your questions, please call your gastroenterologist to clarify.  If you requested that your care partner not be given the details of your procedure findings, then the procedure report has been included in a sealed envelope for you to review at your convenience later.  YOU SHOULD EXPECT: Some feelings of bloating in the abdomen. Passage of more gas than usual.  Walking can help get rid of the air that was put into your GI tract during the procedure and reduce the bloating. If you had a lower endoscopy (such as a colonoscopy or flexible sigmoidoscopy) you may notice spotting of blood in your stool or on the toilet paper. If you underwent a bowel prep for your procedure, you may not have a normal bowel movement for a few days.  Please Note:  You might notice some irritation and congestion in your nose or some drainage.  This is from the oxygen used during your procedure.  There is no need for concern and it should clear up in a day or so.  SYMPTOMS TO REPORT IMMEDIATELY:   Following lower endoscopy (colonoscopy or flexible sigmoidoscopy):  Excessive amounts of blood in the stool  Significant tenderness or worsening of abdominal pains  Swelling of the abdomen that is new, acute  Fever of 100F or higher   For urgent or emergent issues, a gastroenterologist can be reached at any hour by calling [phone].   DIET:  We do recommend a small meal at first, but then you may proceed to your regular diet.  Drink plenty of fluids but you should avoid alcoholic beverages for 24 hours.  ACTIVITY:  You should plan to take it easy for the rest of today and you should NOT DRIVE or use heavy  machinery until tomorrow (because of the sedation medicines used during the test).    FOLLOW UP: Our staff will call the number listed on your records 48-72 hours following your procedure to check on you and address any questions or concerns that you may have regarding the information given to you following your procedure. If we do not reach you, we will leave a message.  We will attempt to reach you two times.  During this call, we will ask if you have developed any symptoms of COVID 19. If you develop any symptoms (ie: fever, flu-like symptoms, shortness of breath, cough etc.) before then, please call [phone].  If you test positive for Covid 19 in the 2 weeks post procedure, please call and report this information to [place].    If any biopsies were taken you will be contacted by phone or by letter within the next 1-3 weeks.  Please call us at [phone] if you have not heard about the biopsies in 3 weeks.    SIGNATURES/CONFIDENTIALITY: You and/or your care partner have signed paperwork which will be entered into your electronic medical record.  These signatures attest to the fact that that the information above on your After Visit Summary has been reviewed and is understood.  Full responsibility of the confidentiality of this discharge information lies with you and/or your care-partner.

## 2019-02-28 NOTE — Progress Notes (Signed)
Report given to PACU, vss 

## 2019-02-28 NOTE — Progress Notes (Signed)
Temp LC V/s CW I have reviewed the patient's medical history in detail and updated the computerized patient record. 

## 2019-02-28 NOTE — Progress Notes (Signed)
Called to room to assist during endoscopic procedure.  Patient ID and intended procedure confirmed with present staff. Received instructions for my participation in the procedure from the performing physician.  

## 2019-02-28 NOTE — Op Note (Signed)
[facility] Patient Name: Danielle Berger Procedure Date: 02/28/2019 11:04 AM MRN: [id number] Endoscopist: Mauri Pole , MD Age: 63 Referring MD:  Date of Birth: 1955-10-29 Gender: Female Account #: [id number] Procedure:                Colonoscopy Indications:              Screening for colorectal malignant neoplasm Medicines:                Monitored Anesthesia Care Procedure:                Pre-Anesthesia Assessment:                           - Prior to the procedure, a History and Physical                            was performed, and patient medications and                            allergies were reviewed. The patient's tolerance of                            previous anesthesia was also reviewed. The risks                            and benefits of the procedure and the sedation                            options and risks were discussed with the patient.                            All questions were answered, and informed consent                            was obtained. Prior Anticoagulants: The patient has                            taken no previous anticoagulant or antiplatelet                            agents. ASA Grade Assessment: II - A patient with                            mild systemic disease. After reviewing the risks                            and benefits, the patient was deemed in                            satisfactory condition to undergo the procedure.                           After obtaining informed consent, the colonoscope  was passed under direct vision. Throughout the                            procedure, the patient's blood pressure, pulse, and                            oxygen saturations were monitored continuously. The                            Colonoscope was introduced through the anus and                            advanced to the the cecum, identified by                            appendiceal orifice and  ileocecal valve. The                            colonoscopy was performed without difficulty. The                            patient tolerated the procedure well. The quality                            of the bowel preparation was excellent. The                            ileocecal valve, appendiceal orifice, and rectum                            were photographed. Scope In: 11:14:26 AM Scope Out: 11:25:33 AM Scope Withdrawal Time: 0 hours 8 minutes 29 seconds  Total Procedure Duration: 0 hours 11 minutes 7 seconds  Findings:                 The perianal and digital rectal examinations were                            normal.                           Two semi-pedunculated polyps were found in the                            recto-sigmoid colon and sigmoid colon. The polyps                            were 10 to 12 mm in size. These polyps were removed                            with a hot snare. Resection and retrieval were                            complete.  Non-bleeding internal hemorrhoids were found during                            retroflexion. The hemorrhoids were small.                           The exam was otherwise without abnormality. Complications:            No immediate complications. Estimated Blood Loss:     Estimated blood loss was minimal. Impression:               - Two 10 to 12 mm polyps at the recto-sigmoid colon                            and in the sigmoid colon, removed with a hot snare.                            Resected and retrieved.                           - Non-bleeding internal hemorrhoids.                           - The examination was otherwise normal. Recommendation:           - Patient has a contact number available for                            emergencies. The signs and symptoms of potential                            delayed complications were discussed with the                            patient. Return to normal  activities tomorrow.                            Written discharge instructions were provided to the                            patient.                           - Resume previous diet.                           - Continue present medications.                           - Await pathology results.                           - Repeat colonoscopy in 3 years for surveillance                            based on pathology results. Mauri Pole, MD 02/28/2019 11:29:33 AM This report has been  signed electronically.

## 2019-03-02 ENCOUNTER — Telehealth: Payer: Self-pay

## 2019-03-02 NOTE — Telephone Encounter (Signed)
Pt returned your call, pls call her again.  °

## 2019-03-02 NOTE — Telephone Encounter (Signed)
1st follow up call made.  NALM 

## 2019-03-02 NOTE — Telephone Encounter (Signed)
  Follow up Call-  Call back number 02/28/2019  Post procedure Call Back phone  # [phone]  Permission to leave phone message Yes  Some recent data might be hidden     Patient questions:  Do you have a fever, pain , or abdominal swelling? No. Pain Score  0 *  Have you tolerated food without any problems? Yes.    Have you been able to return to your normal activities? Yes.    Do you have any questions about your discharge instructions: Diet   No. Medications  No. Follow up visit  No.  Do you have questions or concerns about your Care? No.  Actions: * If pain score is 4 or above: No action needed, pain <4.  1. Have you developed a fever since your procedure? no  2.   Have you had an respiratory symptoms (SOB or cough) since your procedure? no  3.   Have you tested positive for COVID 19 since your procedure? no  4.   Have you had any family members/close contacts diagnosed with the COVID 19 since your procedure?  no   If yes to any of these questions please route to Joylene John, RN and Alphonsa Gin, Therapist, sports.

## 2019-03-08 ENCOUNTER — Encounter: Payer: Self-pay | Admitting: Gastroenterology

## 2019-03-15 ENCOUNTER — Other Ambulatory Visit: Payer: Self-pay | Admitting: Family Medicine

## 2019-03-15 DIAGNOSIS — E038 Other specified hypothyroidism: Secondary | ICD-10-CM

## 2019-03-15 MED FILL — ?CARVEDILOL 12.5 MG TABLET: 12.5 | 30 days supply | Qty: 60 | Fill #4

## 2019-03-15 MED FILL — ATORVASTATIN 80 MG TABLET: 80 | 30 days supply | Qty: 30 | Fill #4

## 2019-03-15 MED FILL — LISINOPRIL-HCTZ 10-12.5 MG: 10-12.5 | 30 days supply | Qty: 30 | Fill #4

## 2019-03-15 MED FILL — LEVOTHYROXINE 88 MCG TABLET: 88 | 30 days supply | Qty: 30 | Fill #0

## 2019-04-19 MED FILL — LISINOPRIL-HCTZ 10-12.5 MG: 10-12.5 | 30 days supply | Qty: 30 | Fill #5

## 2019-04-19 MED FILL — ATORVASTATIN 80 MG TABLET: 80 | 30 days supply | Qty: 30 | Fill #5

## 2019-04-19 MED FILL — ?CARVEDILOL 12.5 MG TABLET: 12.5 | 30 days supply | Qty: 60 | Fill #5

## 2019-04-19 MED FILL — LEVOTHYROXINE 88 MCG TABLET: 88 | 30 days supply | Qty: 30 | Fill #1

## 2019-05-23 ENCOUNTER — Other Ambulatory Visit: Payer: Self-pay | Admitting: Family Medicine

## 2019-05-23 DIAGNOSIS — E038 Other specified hypothyroidism: Secondary | ICD-10-CM

## 2019-05-23 MED FILL — ?CARVEDILOL 12.5 MG TABLET: 12.5 | 30 days supply | Qty: 60 | Fill #0

## 2019-05-23 MED FILL — ATORVASTATIN 80 MG TABLET: 80 | 30 days supply | Qty: 30 | Fill #0

## 2019-05-23 MED FILL — LISINOPRIL-HCTZ 10-12.5 MG: 10-12.5 | 30 days supply | Qty: 30 | Fill #0

## 2019-05-24 MED FILL — LEVOTHYROXINE 88 MCG TABLET: 88 | 30 days supply | Qty: 30 | Fill #0

## 2019-06-22 MED FILL — LISINOPRIL-HCTZ 10-12.5 MG: 10-12.5 | 30 days supply | Qty: 30 | Fill #1

## 2019-06-22 MED FILL — ?CARVEDILOL 12.5 MG TABLET: 12.5 | 30 days supply | Qty: 60 | Fill #1

## 2019-06-22 MED FILL — LEVOTHYROXINE 88 MCG TABLET: 88 | 30 days supply | Qty: 30 | Fill #1

## 2019-06-22 MED FILL — ATORVASTATIN 80 MG TABLET: 80 | 30 days supply | Qty: 30 | Fill #1

## 2019-07-22 ENCOUNTER — Other Ambulatory Visit: Payer: Self-pay | Admitting: Family Medicine

## 2019-07-22 DIAGNOSIS — E038 Other specified hypothyroidism: Secondary | ICD-10-CM

## 2019-07-22 MED FILL — ?LEVOTHYROXINE 88 MCG TAB: 88 | 30 days supply | Qty: 30 | Fill #0

## 2019-07-22 MED FILL — LISINOPRIL-HCTZ 10-12.5 MG: 10-12.5 | 30 days supply | Qty: 30 | Fill #2

## 2019-07-22 MED FILL — ATORVASTATIN 80 MG TABLET: 80 | 30 days supply | Qty: 30 | Fill #2

## 2019-07-22 MED FILL — ?CARVEDILOL 12.5 MG TABLET: 12.5 | 30 days supply | Qty: 60 | Fill #2

## 2019-08-10 ENCOUNTER — Other Ambulatory Visit: Payer: Self-pay

## 2019-08-10 ENCOUNTER — Encounter: Payer: Self-pay | Admitting: Family Medicine

## 2019-08-10 ENCOUNTER — Ambulatory Visit: Payer: Medicare Other | Attending: Family Medicine | Admitting: Family Medicine

## 2019-08-10 VITALS — BP 115/67 | HR 65 | Ht 62.0 in | Wt 151.0 lb

## 2019-08-10 DIAGNOSIS — Z720 Tobacco use: Secondary | ICD-10-CM | POA: Diagnosis not present

## 2019-08-10 DIAGNOSIS — E782 Mixed hyperlipidemia: Secondary | ICD-10-CM

## 2019-08-10 DIAGNOSIS — I10 Essential (primary) hypertension: Secondary | ICD-10-CM

## 2019-08-10 DIAGNOSIS — F17200 Nicotine dependence, unspecified, uncomplicated: Secondary | ICD-10-CM

## 2019-08-10 DIAGNOSIS — E038 Other specified hypothyroidism: Secondary | ICD-10-CM | POA: Diagnosis not present

## 2019-08-10 MED ORDER — LISINOPRIL-HYDROCHLOROTHIAZIDE 10-12.5 MG PO TABS: 1.0000 | ORAL_TABLET | Freq: Every day | ORAL | 1 refills | Status: DC

## 2019-08-10 MED ORDER — CARVEDILOL 12.5 MG PO TABS: 12.5000 mg | ORAL_TABLET | Freq: Two times a day (BID) | ORAL | 1 refills | Status: DC

## 2019-08-10 MED ORDER — ATORVASTATIN CALCIUM 80 MG PO TABS: 80.0000 mg | ORAL_TABLET | Freq: Every day | ORAL | 1 refills | Status: DC

## 2019-08-10 NOTE — Patient Instructions (Signed)
Steps to Quit Smoking Smoking tobacco is the leading cause of preventable death. It can affect almost every organ in the body. Smoking puts you and people around you at risk for many serious, long-lasting (chronic) diseases. Quitting smoking can be hard, but it is one of the best things that you can do for your health. It is never too late to quit. How do I get ready to quit? When you decide to quit smoking, make a plan to help you succeed. Before you quit:  Pick a date to quit. Set a date within the next 2 weeks to give you time to prepare.  Write down the reasons why you are quitting. Keep this list in places where you will see it often.  Tell your family, friends, and co-workers that you are quitting. Their support is important.  Talk with your doctor about the choices that may help you quit.  Find out if your health insurance will pay for these treatments.  Know the people, places, things, and activities that make you want to smoke (triggers). Avoid them. What first steps can I take to quit smoking?  Throw away all cigarettes at home, at work, and in your car.  Throw away the things that you use when you smoke, such as ashtrays and lighters.  Clean your car. Make sure to empty the ashtray.  Clean your home, including curtains and carpets. What can I do to help me quit smoking? Talk with your doctor about taking medicines and seeing a counselor at the same time. You are more likely to succeed when you do both.  If you are pregnant or breastfeeding, talk with your doctor about counseling or other ways to quit smoking. Do not take medicine to help you quit smoking unless your doctor tells you to do so. To quit smoking: Quit right away  Quit smoking totally, instead of slowly cutting back on how much you smoke over a period of time.  Go to counseling. You are more likely to quit if you go to counseling sessions regularly. Take medicine You may take medicines to help you quit. Some  medicines need a prescription, and some you can buy over-the-counter. Some medicines may contain a drug called nicotine to replace the nicotine in cigarettes. Medicines may:  Help you to stop having the desire to smoke (cravings).  Help to stop the problems that come when you stop smoking (withdrawal symptoms). Your doctor may ask you to use:  Nicotine patches, gum, or lozenges.  Nicotine inhalers or sprays.  Non-nicotine medicine that is taken by mouth. Find resources Find resources and other ways to help you quit smoking and remain smoke-free after you quit. These resources are most helpful when you use them often. They include:  Online chats with a counselor.  Phone quitlines.  Printed self-help materials.  Support groups or group counseling.  Text messaging programs.  Mobile phone apps. Use apps on your mobile phone or tablet that can help you stick to your quit plan. There are many free apps for mobile phones and tablets as well as websites. Examples include Quit Guide from the CDC and smokefree.gov  What things can I do to make it easier to quit?   Talk to your family and friends. Ask them to support and encourage you.  Call a phone quitline (1-800-QUIT-NOW), reach out to support groups, or work with a counselor.  Ask people who smoke to not smoke around you.  Avoid places that make you want to smoke,   such as: ? Bars. ? Parties. ? Smoke-break areas at work.  Spend time with people who do not smoke.  Lower the stress in your life. Stress can make you want to smoke. Try these things to help your stress: ? Getting regular exercise. ? Doing deep-breathing exercises. ? Doing yoga. ? Meditating. ? Doing a body scan. To do this, close your eyes, focus on one area of your body at a time from head to toe. Notice which parts of your body are tense. Try to relax the muscles in those areas. How will I feel when I quit smoking? Day 1 to 3 weeks Within the first 24 hours,  you may start to have some problems that come from quitting tobacco. These problems are very bad 2-3 days after you quit, but they do not often last for more than 2-3 weeks. You may get these symptoms:  Mood swings.  Feeling restless, nervous, angry, or annoyed.  Trouble concentrating.  Dizziness.  Strong desire for high-sugar foods and nicotine.  Weight gain.  Trouble pooping (constipation).  Feeling like you may vomit (nausea).  Coughing or a sore throat.  Changes in how the medicines that you take for other issues work in your body.  Depression.  Trouble sleeping (insomnia). Week 3 and afterward After the first 2-3 weeks of quitting, you may start to notice more positive results, such as:  Better sense of smell and taste.  Less coughing and sore throat.  Slower heart rate.  Lower blood pressure.  Clearer skin.  Better breathing.  Fewer sick days. Quitting smoking can be hard. Do not give up if you fail the first time. Some people need to try a few times before they succeed. Do your best to stick to your quit plan, and talk with your doctor if you have any questions or concerns. Summary  Smoking tobacco is the leading cause of preventable death. Quitting smoking can be hard, but it is one of the best things that you can do for your health.  When you decide to quit smoking, make a plan to help you succeed.  Quit smoking right away, not slowly over a period of time.  When you start quitting, seek help from your doctor, family, or friends. This information is not intended to replace advice given to you by your health care provider. Make sure you discuss any questions you have with your health care provider. Document Revised: 12/24/2018 Document Reviewed: 06/19/2018 Elsevier Patient Education  2020 Elsevier Inc.  

## 2019-08-10 NOTE — Progress Notes (Signed)
States that she feel tired since the 2nd dose of her Covid vaccine.  Would like all new script sent to [facility] on file.

## 2019-08-10 NOTE — Progress Notes (Signed)
Subjective:  Patient ID: Jonelle Sports, female    DOB: February 22, 1956  Age: 64 y.o. MRN: [id number]  CC: Hypothyroidism   HPI Courtnee Myer is a 64 year old female with a history of tobacco abuse (quit in 04/2017 then restarted), hypertension, hyperlipidemia, hypothyroidism who comes into the clinic for a follow-up visit. She is doing well and has no concerns today. Compliant with her levothyroxine, doing well on her antihypertensive and her statin. Continues to smoke; she quit in 04/2017 but then restarted again.  It has been difficult to quit as her husband is an active smoker.  Not ready to quit at this time.  Past Medical History:  Diagnosis Date  . Chest pain 07/17/2015  . Chronic renal disease    AS A CHILD  . Esophageal reflux    PT. DENIES 02/15/19  . Headache   . HLD (hyperlipidemia)   . Hypertension   . Hypothyroidism   . Tobacco abuse 07/17/2015    Past Surgical History:  Procedure Laterality Date  . Brain Tumor removed      when patient was in the 6th grade  . KIDNEY SURGERY     when patient was int he 8th grade    Family History  Adopted: Yes    Allergies  Allergen Reactions  . Codeine Nausea And Vomiting  . Penicillins Rash    Outpatient Medications Prior to Visit  Medication Sig Dispense Refill  . aspirin EC 81 MG EC tablet Take 1 tablet (81 mg total) by mouth daily.    Marland Kitchen atorvastatin (LIPITOR) 80 MG tablet Take 1 tablet (80 mg total) by mouth daily. 30 tablet 6  . carvedilol (COREG) 12.5 MG tablet Take 1 tablet (12.5 mg total) by mouth 2 (two) times daily with a meal. 60 tablet 6  . levothyroxine (SYNTHROID) 88 MCG tablet TAKE 1 TABLET BY MOUTH DAILY BEFORE BREAKFAST. Must have office visit for refills 30 tablet 0  . lisinopril-hydrochlorothiazide (ZESTORETIC) 10-12.5 MG tablet Take 1 tablet by mouth daily. 30 tablet 6  . loratadine (CLARITIN) 10 MG tablet Take 1 tablet (10 mg total) by mouth daily as needed for allergies. 30 tablet 0  .  Omega-3 Fatty Acids (FISH OIL PO) Take by mouth.    . traZODone (DESYREL) 50 MG tablet Take 1 tablet (50 mg total) by mouth at bedtime as needed for sleep. (Patient not taking: Reported on 02/28/2019) 30 tablet 5   No facility-administered medications prior to visit.     ROS Review of Systems  Constitutional: Negative for activity change, appetite change and fatigue.  HENT: Negative for congestion, sinus pressure and sore throat.   Eyes: Negative for visual disturbance.  Respiratory: Negative for cough, chest tightness, shortness of breath and wheezing.   Cardiovascular: Negative for chest pain and palpitations.  Gastrointestinal: Negative for abdominal distention, abdominal pain and constipation.  Endocrine: Negative for polydipsia.  Genitourinary: Negative for dysuria and frequency.  Musculoskeletal: Negative for arthralgias and back pain.  Skin: Negative for rash.  Neurological: Negative for tremors, light-headedness and numbness.  Hematological: Does not bruise/bleed easily.  Psychiatric/Behavioral: Negative for agitation and behavioral problems.    Objective:  BP 115/67   Pulse 65   Ht '5\' 2"'  (1.575 m)   Wt 151 lb (68.5 kg)   SpO2 97%   BMI 27.62 kg/m   BP/Weight 08/10/2019 02/28/2019 62/04/3084  Systolic BP 578 469 -  Diastolic BP 67 80 -  Wt. (Lbs) 151 156 156.4  BMI 27.62 28.53 28.61  Physical Exam Constitutional:      Appearance: She is well-developed.  Neck:     Vascular: No JVD.  Cardiovascular:     Rate and Rhythm: Normal rate.     Heart sounds: Normal heart sounds. No murmur.  Pulmonary:     Effort: Pulmonary effort is normal.     Breath sounds: Normal breath sounds. No wheezing or rales.  Chest:     Chest wall: No tenderness.  Abdominal:     General: Bowel sounds are normal. There is no distension.     Palpations: Abdomen is soft. There is no mass.     Tenderness: There is no abdominal tenderness.  Musculoskeletal:        General: Normal  range of motion.     Right lower leg: No edema.     Left lower leg: No edema.  Neurological:     Mental Status: She is alert and oriented to person, place, and time.  Psychiatric:        Mood and Affect: Mood normal.     CMP Latest Ref Rng & Units 12/14/2018 02/22/2018 11/24/2017  Glucose 65 - 99 mg/dL 103(H) 103(H) 103(H)  BUN 8 - 27 mg/dL 29(H) 26 18  Creatinine 0.57 - 1.00 mg/dL 1.87(H) 1.41(H) 1.07(H)  Sodium 134 - 144 mmol/L 137 138 144  Potassium 3.5 - 5.2 mmol/L 5.0 4.9 5.1  Chloride 96 - 106 mmol/L 99 98 107(H)  CO2 20 - 29 mmol/L '24 23 24  ' Calcium 8.7 - 10.3 mg/dL 9.2 9.7 9.5  Total Protein 6.0 - 8.5 g/dL 6.9 7.1 6.8  Total Bilirubin 0.0 - 1.2 mg/dL 0.3 0.4 0.2  Alkaline Phos 39 - 117 IU/L 95 117 96  AST 0 - 40 IU/L '23 22 16  ' ALT 0 - 32 IU/L '20 23 12    ' Lipid Panel     Component Value Date/Time   CHOL 181 12/14/2018 1006   TRIG 154 (H) 12/14/2018 1006   HDL 49 12/14/2018 1006   CHOLHDL 3.7 12/14/2018 1006   CHOLHDL 3.7 05/07/2016 0946   VLDL 34 (H) 05/07/2016 0946   LDLCALC 105 (H) 12/14/2018 1006    CBC    Component Value Date/Time   WBC 7.6 07/17/2015 1011   RBC 5.08 07/17/2015 1011   HGB 14.4 07/17/2015 1011   HCT 45.0 07/17/2015 1011   PLT 356 07/17/2015 1011   MCV 88.6 07/17/2015 1011   MCH 28.3 07/17/2015 1011   MCHC 32.0 07/17/2015 1011   RDW 13.2 07/17/2015 1011   LYMPHSABS 2.4 07/17/2015 1011   MONOABS 0.5 07/17/2015 1011   EOSABS 0.2 07/17/2015 1011   BASOSABS 0.0 07/17/2015 1011    Lab Results  Component Value Date   HGBA1C 5.8 (H) 12/14/2018    Assessment & Plan:  1. Mixed hyperlipidemia Controlled Low-cholesterol diet - atorvastatin (LIPITOR) 80 MG tablet; Take 1 tablet (80 mg total) by mouth daily.  Dispense: 90 tablet; Refill: 1  2. Essential hypertension Controlled Counseled on blood pressure goal of less than 130/80, low-sodium, DASH diet, medication compliance, 150 minutes of moderate intensity exercise per week. Discussed  medication compliance, adverse effects. - CMP14+EGFR - Lipid panel - carvedilol (COREG) 12.5 MG tablet; Take 1 tablet (12.5 mg total) by mouth 2 (two) times daily with a meal.  Dispense: 180 tablet; Refill: 1 - lisinopril-hydrochlorothiazide (ZESTORETIC) 10-12.5 MG tablet; Take 1 tablet by mouth daily.  Dispense: 90 tablet; Refill: 1  3. Other specified hypothyroidism Controlled We will send off thyroid  panel and adjust regimen accordingly - T4, free - TSH  4. Tobacco abuse Spent 3 minutes counseling on smoking cessation and she is not ready to quit      Charlott Rakes, MD, FAAFP. [facility] [place] [address], [place]   08/10/2019, 1:58 PM

## 2019-08-11 ENCOUNTER — Encounter: Payer: Self-pay | Admitting: Family Medicine

## 2019-08-11 ENCOUNTER — Other Ambulatory Visit: Payer: Self-pay | Admitting: Family Medicine

## 2019-08-11 DIAGNOSIS — E038 Other specified hypothyroidism: Secondary | ICD-10-CM

## 2019-08-11 LAB — CMP14+EGFR
ALT: 16 IU/L (ref 0–32)
AST: 22 IU/L (ref 0–40)
Albumin/Globulin Ratio: 1.9 (ref 1.2–2.2)
Albumin: 4.6 g/dL (ref 3.8–4.8)
Alkaline Phosphatase: 105 IU/L (ref 39–117)
BUN/Creatinine Ratio: 14 (ref 12–28)
BUN: 23 mg/dL (ref 8–27)
Bilirubin Total: 0.4 mg/dL (ref 0.0–1.2)
CO2: 23 mmol/L (ref 20–29)
Calcium: 9.6 mg/dL (ref 8.7–10.3)
Chloride: 100 mmol/L (ref 96–106)
Creatinine, Ser: 1.69 mg/dL — ABNORMAL HIGH (ref 0.57–1.00)
GFR calc Af Amer: 36 mL/min/{1.73_m2} — ABNORMAL LOW (ref >59)
GFR calc non Af Amer: 31 mL/min/{1.73_m2} — ABNORMAL LOW (ref >59)
Globulin, Total: 2.4 g/dL (ref 1.5–4.5)
Glucose: 85 mg/dL (ref 65–99)
Potassium: 4.5 mmol/L (ref 3.5–5.2)
Sodium: 139 mmol/L (ref 134–144)
Total Protein: 7 g/dL (ref 6.0–8.5)

## 2019-08-11 LAB — LIPID PANEL
Chol/HDL Ratio: 3.8 ratio (ref 0.0–4.4)
Cholesterol, Total: 180 mg/dL (ref 100–199)
HDL: 48 mg/dL (ref >39)
LDL Chol Calc (NIH): 93 mg/dL (ref 0–99)
Triglycerides: 230 mg/dL — ABNORMAL HIGH (ref 0–149)
VLDL Cholesterol Cal: 39 mg/dL (ref 5–40)

## 2019-08-11 LAB — TSH: TSH: 2.97 u[IU]/mL (ref 0.450–4.500)

## 2019-08-11 LAB — T4, FREE: Free T4: 1.41 ng/dL (ref 0.82–1.77)

## 2019-08-11 MED ORDER — LEVOTHYROXINE SODIUM 88 MCG PO TABS: ORAL_TABLET | ORAL | 6 refills | Status: DC

## 2019-08-12 ENCOUNTER — Telehealth: Payer: Self-pay

## 2019-08-12 NOTE — Telephone Encounter (Signed)
-----   Message from Charlott Rakes, MD sent at 08/11/2019  8:33 AM EDT ----- Liver function is normal, thyroid is normal, kidney function is slightly abnormal but has improved from previous labs.  Please advise to abstain from medications like Aleve and ibuprofen and increase fluid intake

## 2019-08-12 NOTE — Telephone Encounter (Signed)
Patient name and DOB has been verified Patient was informed of lab results. Patient had no questions.  

## 2019-09-01 MED FILL — ?LEVOTHYROXINE 88 MCG TAB: 88 | 30 days supply | Qty: 30 | Fill #0

## 2019-09-29 MED FILL — ?LEVOTHYROXINE 88 MCG TAB: 88 | 30 days supply | Qty: 30 | Fill #1

## 2019-10-11 ENCOUNTER — Other Ambulatory Visit: Payer: Self-pay | Admitting: Family Medicine

## 2019-10-11 ENCOUNTER — Other Ambulatory Visit: Payer: Self-pay

## 2019-10-11 ENCOUNTER — Ambulatory Visit: Payer: Medicare Other | Attending: Family Medicine | Admitting: Family Medicine

## 2019-10-11 ENCOUNTER — Encounter: Payer: Self-pay | Admitting: Family Medicine

## 2019-10-11 VITALS — BP 115/72 | HR 64 | Ht 62.0 in | Wt 153.4 lb

## 2019-10-11 DIAGNOSIS — Z1231 Encounter for screening mammogram for malignant neoplasm of breast: Secondary | ICD-10-CM

## 2019-10-11 DIAGNOSIS — E038 Other specified hypothyroidism: Secondary | ICD-10-CM

## 2019-10-11 DIAGNOSIS — Z0000 Encounter for general adult medical examination without abnormal findings: Secondary | ICD-10-CM | POA: Diagnosis not present

## 2019-10-11 DIAGNOSIS — Z23 Encounter for immunization: Secondary | ICD-10-CM | POA: Diagnosis not present

## 2019-10-11 DIAGNOSIS — E2839 Other primary ovarian failure: Secondary | ICD-10-CM

## 2019-10-11 DIAGNOSIS — Z720 Tobacco use: Secondary | ICD-10-CM

## 2019-10-11 MED ORDER — LEVOTHYROXINE SODIUM 88 MCG PO TABS: ORAL_TABLET | ORAL | 1 refills | Status: DC

## 2019-10-11 NOTE — Progress Notes (Signed)
Having pain in lower right side of abdomen for 2 weeks.  Needs all new scripts sent through [facility]

## 2019-10-11 NOTE — Patient Instructions (Signed)
  Danielle Berger , Thank you for taking time to come for your Medicare Wellness Visit. I appreciate your ongoing commitment to your health goals. Please review the following plan we discussed and let me know if I can assist you in the future.   These are the goals we discussed: Goals   None     This is a list of the screening recommended for you and due dates:  Health Maintenance  Topic Date Due  . DEXA scan (bone density measurement)  Never done  . Pneumonia vaccines (1 of 2 - PCV13) Never done  . Flu Shot  11/13/2019  . Mammogram  09/29/2020  . Pap Smear  01/30/2022  . Tetanus Vaccine  06/11/2027  . Colon Cancer Screening  02/27/2029  . COVID-19 Vaccine  Completed  .  Hepatitis C: One time screening is recommended by Center for Disease Control  (CDC) for  adults born from [birth year] through 1966.   Completed  . HIV Screening  Completed

## 2019-10-11 NOTE — Progress Notes (Signed)
Subjective:    Danielle Berger is a 64 y.o. female who presents for a Welcome to Medicare exam.  She has noticed a 2-week history of right inguinal pain which she describes as a 7 sometimes and at other times is a 10/10.  Denies presence of GI symptoms She is requesting refill of her levothyroxine [facility]. She continues to smoke cigarettes and has smoked for almost 50 years.  She has cut back now and is down to 2 cigarettes a day.   Review of Systems General: negative for fever, weight loss, appetite change Eyes: no visual symptoms. ENT: no ear symptoms, no sinus tenderness, no nasal congestion or sore throat. Neck: no pain  Respiratory: no wheezing, shortness of breath, cough Cardiovascular: no chest pain, no dyspnea on exertion, no pedal edema, no orthopnea. Gastrointestinal: + R inguinal pain, no abdominal pain, no diarrhea, no constipation Genito-Urinary: no urinary frequency, no dysuria, no polyuria. Hematologic: no bruising Endocrine: no cold or heat intolerance Neurological: no headaches, no seizures, no tremors Musculoskeletal: no joint pains, no joint swelling Skin: no pruritus, no rash. Psychological: no depression, no anxiety,           Objective:    Today's Vitals   10/11/19 1011 10/11/19 1014  BP: 115/72   Pulse: 64   SpO2: 99%   Weight: 153 lb 6.4 oz (69.6 kg)   Height: 5\' 2"  (1.575 m)   PainSc:  6   Body mass index is 28.06 kg/m.  Medications Outpatient Encounter Medications as of 10/11/2019  Medication Sig  . aspirin EC 81 MG EC tablet Take 1 tablet (81 mg total) by mouth daily.  Marland Kitchen atorvastatin (LIPITOR) 80 MG tablet Take 1 tablet (80 mg total) by mouth daily.  . carvedilol (COREG) 12.5 MG tablet Take 1 tablet (12.5 mg total) by mouth 2 (two) times daily with a meal.  . levothyroxine (SYNTHROID) 88 MCG tablet TAKE 1 TABLET BY MOUTH DAILY BEFORE BREAKFAST.  Marland Kitchen lisinopril-hydrochlorothiazide (ZESTORETIC) 10-12.5 MG tablet Take 1  tablet by mouth daily.  Marland Kitchen loratadine (CLARITIN) 10 MG tablet Take 1 tablet (10 mg total) by mouth daily as needed for allergies.  . Omega-3 Fatty Acids (FISH OIL PO) Take by mouth.  . [DISCONTINUED] levothyroxine (SYNTHROID) 88 MCG tablet TAKE 1 TABLET BY MOUTH DAILY BEFORE BREAKFAST.   No facility-administered encounter medications on file as of 10/11/2019.     History: Past Medical History:  Diagnosis Date  . Chest pain 07/17/2015  . Chronic renal disease    AS A CHILD  . Esophageal reflux    PT. DENIES 02/15/19  . Headache   . HLD (hyperlipidemia)   . Hypertension   . Hypothyroidism   . Tobacco abuse 07/17/2015   Past Surgical History:  Procedure Laterality Date  . Brain Tumor removed      when patient was in the 6th grade  . KIDNEY SURGERY     when patient was int he 8th grade    Family History  Adopted: Yes   Social History   Occupational History  . Not on file  Tobacco Use  . Smoking status: Current Every Day Smoker    Packs/day: 0.50  . Smokeless tobacco: Never Used  Vaping Use  . Vaping Use: Never used  Substance and Sexual Activity  . Alcohol use: Yes    Alcohol/week: 2.0 standard drinks    Types: 1 Glasses of wine, 1 Shots of liquor per week    Comment: occ  .  Drug use: No  . Sexual activity: Yes    Birth control/protection: None    Tobacco Counseling Ready to quit: No Counseling given: Yes   Immunizations and Health Maintenance Immunization History  Administered Date(s) Administered  . Influenza,inj,Quad PF,6+ Mos 02/19/2017, 12/14/2018  . PFIZER SARS-COV-2 Vaccination 07/01/2019, 07/22/2019  . Pneumococcal Conjugate-13 10/11/2019  . Tdap 06/10/2017   Health Maintenance Due  Topic Date Due  . DEXA SCAN  Never done    Activities of Daily Living In your present state of health, do you have any difficulty performing the following activities: 10/11/2019  Hearing? N  Vision? N  Difficulty concentrating or making decisions? Y  Walking or  climbing stairs? N  Dressing or bathing? N  Doing errands, shopping? N  Preparing Food and eating ? N  Using the Toilet? N  In the past six months, have you accidently leaked urine? N  Do you have problems with loss of bowel control? N  Managing your Medications? N  Managing your Finances? N  Housekeeping or managing your Housekeeping? N  Some recent data might be hidden    Physical Exam: Constitutional: normal appearing,  Eyes: PERRLA HEENT: Head is atraumatic, normal sinuses, normal oropharynx, normal appearing tonsils and palate, tympanic membrane is normal bilaterally. Neck: normal range of motion, no thyromegaly, no JVD Breast: normal appearance, no masses, no tenderness. Cardiovascular: normal rate and rhythm, normal heart sounds, no murmurs, rub or gallop, no pedal edema Respiratory: Normal breath sounds, clear to auscultation bilaterally, no wheezes, no rales, no rhonchi Abdomen: soft, not tender to palpation, normal bowel sounds, no enlarged organs, +TTP R inguinal region Musculoskeletal: Full ROM, no tenderness in joints Skin: warm and dry, no lesions. Neurological: alert, oriented x3, cranial nerves I-XII grossly intact , normal motor strength, normal sensation. Psychological: normal mood.  (optional), or other factors deemed appropriate based on the beneficiary's medical and social history and current clinical standards.  Advanced Directives: Does Patient Have a Medical Advance Directive?: No    Assessment:    Vision/Hearing screen  Hearing Screening   125Hz  250Hz  500Hz  1000Hz  2000Hz  3000Hz  4000Hz  6000Hz  8000Hz   Right ear:           Left ear:             Visual Acuity Screening   Right eye Left eye Both eyes  Without correction: 20/40 20/40   With correction:       Dietary issues and exercise activities discussed:  Current Exercise Habits: The patient does not participate in regular exercise at present, Exercise limited by: None identified  Goals   None     Depression Screen PHQ 2/9 Scores 10/11/2019 08/10/2019 08/26/2018 05/25/2018  PHQ - 2 Score 0 0 0 0  PHQ- 9 Score - 1 0 0  Exception Documentation - - - -     Fall Risk Fall Risk  10/11/2019  Falls in the past year? 0  Risk for fall due to : No Fall Risks    Cognitive Function: MMSE - Mini Mental State Exam 10/11/2019  Orientation to time 5  Orientation to Place 5  Registration 3  Attention/ Calculation 0  Recall 2  Language- name 2 objects 2  Language- repeat 1  Language- follow 3 step command 1  Language- read & follow direction 1  Write a sentence 1  Copy design 1  Total score 22        Patient Care Team: Charlott Rakes, MD as PCP - General (Family Medicine)  Plan:    1. Encounter for Medicare annual wellness exam Counseled on 150 minutes of exercise per week, healthy eating (including decreased daily intake of saturated fats, cholesterol, added sugars, sodium), routine healthcare maintenance. Up-to-date on colonoscopy and Pap smear Future Pap smears no longer indicated  2. Other specified hypothyroidism - levothyroxine (SYNTHROID) 88 MCG tablet; TAKE 1 TABLET BY MOUTH DAILY BEFORE BREAKFAST.  Dispense: 90 tablet; Refill: 1 - CBC with Differential/Platelet  3. Encounter for screening mammogram for malignant neoplasm of breast Mammogram ordered  4. Tobacco abuse Smoking cessation support: smoking cessation hotline: 1-800-QUIT-NOW.  Smoking cessation classes are available through [facility]. Call [phone] or visit our website at [email or web address]  Spent 3 minutes counseling on dangers of tobacco use and benefits of quitting, offered pharmacological intervention to aid quitting and patient is not ready to quit. - CT CHEST LUNG CA SCREEN LOW DOSE W/O CM; Future  5. Estrogen deficiency - DG Bone Density; Future    R inguinal pain - low suspicion for GI etiology but suspicious for muscle spasm versus inguinal ligament pain  advised to apply OTC voltaren gel and if symptoms persist will consider imaging I have personally reviewed and noted the following in the patient's chart:   . Medical and social history . Use of alcohol, tobacco or illicit drugs  . Current medications and supplements . Functional ability and status . Nutritional status . Physical activity . Advanced directives . List of other physicians . Hospitalizations, surgeries, and ER visits in previous 12 months . Vitals . Screenings to include cognitive, depression, and falls . Referrals and appointments  In addition, I have reviewed and discussed with patient certain preventive protocols, quality metrics, and best practice recommendations. A written personalized care plan for preventive services as well as general preventive health recommendations were provided to patient.     Charlott Rakes, MD 10/11/2019

## 2019-10-12 LAB — CBC WITH DIFFERENTIAL/PLATELET
Basophils Absolute: 0.1 10*3/uL (ref 0.0–0.2)
Basos: 1 %
EOS (ABSOLUTE): 0.2 10*3/uL (ref 0.0–0.4)
Eos: 3 %
Hematocrit: 38 % (ref 34.0–46.6)
Hemoglobin: 12.9 g/dL (ref 11.1–15.9)
Immature Grans (Abs): 0 10*3/uL (ref 0.0–0.1)
Immature Granulocytes: 0 %
Lymphocytes Absolute: 1.9 10*3/uL (ref 0.7–3.1)
Lymphs: 27 %
MCH: 31 pg (ref 26.6–33.0)
MCHC: 33.9 g/dL (ref 31.5–35.7)
MCV: 91 fL (ref 79–97)
Monocytes Absolute: 0.5 10*3/uL (ref 0.1–0.9)
Monocytes: 7 %
Neutrophils Absolute: 4.2 10*3/uL (ref 1.4–7.0)
Neutrophils: 62 %
Platelets: 300 10*3/uL (ref 150–450)
RBC: 4.16 x10E6/uL (ref 3.77–5.28)
RDW: 12.8 % (ref 11.7–15.4)
WBC: 6.9 10*3/uL (ref 3.4–10.8)

## 2019-10-13 ENCOUNTER — Telehealth: Payer: Self-pay

## 2019-10-13 NOTE — Telephone Encounter (Signed)
Patient name and DOB has been verified Patient was informed of lab results. Patient had no questions.  

## 2019-10-13 NOTE — Telephone Encounter (Signed)
-----   Message from Charlott Rakes, MD sent at 10/12/2019 12:52 PM EDT ----- Please inform the patient that labs are normal. Thank you.

## 2019-10-21 ENCOUNTER — Ambulatory Visit (HOSPITAL_COMMUNITY)
Admission: RE | Admit: 2019-10-21 | Discharge: 2019-10-21 | Disposition: A | Discharge status: Home / Self Care | Payer: Medicare Other | Source: Ambulatory Visit | Attending: Family Medicine | Admitting: Family Medicine

## 2019-10-21 DIAGNOSIS — Z122 Encounter for screening for malignant neoplasm of respiratory organs: Secondary | ICD-10-CM | POA: Insufficient documentation

## 2019-10-21 DIAGNOSIS — J439 Emphysema, unspecified: Secondary | ICD-10-CM | POA: Insufficient documentation

## 2019-10-21 DIAGNOSIS — I700 Atherosclerosis of aorta: Secondary | ICD-10-CM | POA: Diagnosis not present

## 2019-10-21 DIAGNOSIS — I2510 Atherosclerotic heart disease of native coronary artery without angina pectoris: Secondary | ICD-10-CM | POA: Insufficient documentation

## 2019-10-21 DIAGNOSIS — Z720 Tobacco use: Secondary | ICD-10-CM

## 2019-10-21 DIAGNOSIS — F17210 Nicotine dependence, cigarettes, uncomplicated: Secondary | ICD-10-CM | POA: Insufficient documentation

## 2019-10-24 ENCOUNTER — Telehealth: Payer: Self-pay

## 2019-10-24 NOTE — Telephone Encounter (Signed)
Patient was called and a voicemail was left informing patient to return phone call for CT results.

## 2019-10-24 NOTE — Telephone Encounter (Signed)
-----   Message from Charlott Rakes, MD sent at 10/24/2019  8:39 AM EDT ----- CT chest is negative for malignancy but reveals presence of emphysema and cholesterol deposits in the arteries.  Please advised work on smoking cessation, low-cholesterol diet.

## 2019-10-26 DIAGNOSIS — B029 Zoster without complications: Secondary | ICD-10-CM | POA: Diagnosis not present

## 2019-10-27 ENCOUNTER — Telehealth: Payer: Self-pay | Admitting: Family Medicine

## 2019-10-27 MED ORDER — GABAPENTIN 300 MG PO CAPS: 300.0000 mg | ORAL_CAPSULE | Freq: Two times a day (BID) | ORAL | 0 refills | Status: DC

## 2019-10-27 NOTE — Telephone Encounter (Signed)
Will route to PCP 

## 2019-10-27 NOTE — Telephone Encounter (Signed)
Please advise   Copied from [place] [id number]. Topic: General - Other >> Oct 27, 2019 11:01 AM Leward Quan A wrote: Reason for CRM: Patient called to say that she was diagnosed with Shingles and want to know if Dr Margarita Rana can please prescribe her some thing because that pain she say is terrible. Can be reached at Ph# [phone]

## 2019-10-27 NOTE — Telephone Encounter (Signed)
I sent a rx for Gabapentin to her pharmacy

## 2019-10-28 ENCOUNTER — Other Ambulatory Visit: Payer: Self-pay | Admitting: Family Medicine

## 2019-10-28 MED ORDER — GABAPENTIN 300 MG PO CAPS: 300.0000 mg | ORAL_CAPSULE | Freq: Two times a day (BID) | ORAL | 0 refills | Status: DC

## 2019-10-28 MED FILL — GABAPENTIN 300 MG CAPSULE: 300 | 30 days supply | Qty: 60 | Fill #0

## 2019-10-28 NOTE — Telephone Encounter (Signed)
Patient was called and informed of medication being sent to pharmacy. 

## 2019-11-03 ENCOUNTER — Telehealth: Payer: Self-pay | Admitting: Family Medicine

## 2019-11-03 NOTE — Telephone Encounter (Signed)
Copied from [place] [id number]. Topic: General - Other >> Nov 03, 2019  9:05 AM Rainey Pines A wrote: Patient has shingles and was given valacyclovir HCL 1mg  tablet and completed medication yesterday. Patient wanted to know if Dr. Margarita Rana would advise her to come in again because she still has shingles and wants to know when she can get a shingles shot . Please advise

## 2019-11-03 NOTE — Telephone Encounter (Signed)
Patient was called and informed of PCP response. 

## 2019-11-03 NOTE — Telephone Encounter (Signed)
Will route to PCP for review. 

## 2019-11-03 NOTE — Telephone Encounter (Signed)
She does not need to come into the clinic again.  Shingles shot can be addressed at her next office visit.

## 2019-11-10 ENCOUNTER — Ambulatory Visit: Payer: Self-pay | Admitting: *Deleted

## 2019-11-10 MED ORDER — LIDOCAINE 5 % EX PTCH: 1.0000 | MEDICATED_PATCH | CUTANEOUS | 0 refills | Status: DC

## 2019-11-10 MED ORDER — AMITRIPTYLINE HCL 25 MG PO TABS: 25.0000 mg | ORAL_TABLET | Freq: Every evening | ORAL | 0 refills | Status: DC | PRN

## 2019-11-10 MED FILL — LIDOCAINE PATCH 5%: 5 | 30 days supply | Qty: 30 | Fill #0

## 2019-11-10 MED FILL — AMITRIPTYLINE HCL 25 MG TAB: 25 | 30 days supply | Qty: 30 | Fill #0

## 2019-11-10 NOTE — Telephone Encounter (Signed)
I have sent a [facility]

## 2019-11-10 NOTE — Telephone Encounter (Signed)
Patient calls with tingling and pain post shingles diagnosis on 10/26/19. No blisters, few red spots on right side of neck with tingling down to her waist. Completed Valacyclovir 21 tabs one week ago. Attempting to take Gabapentin 300 mg twice daily. Each dose of it causes several episodes of diarrhea. Discussed imodium and bulky foods to help reduce the diarrhea. Rating nerve pain an 8 and asking for medication to help with the pain. [facility] and would need it delivered. Routing to physician for consideration.  Reason for Disposition . Postherpetic neuralgia, questions about  Answer Assessment - Initial Assessment Questions 1. APPEARANCE of RASH: "Describe the rash."      Red spots on right ear. Diagnosed  2. LOCATION: "Where is the rash located?"      3. ONSET: "When did the rash start?"      7/14 4. ITCHING: "Does the rash itch?" If Yes, ask: "How bad is the itch?"  (Scale 1-10; or mild, moderate, severe)     yes 5. PAIN: "Does the rash hurt?" If Yes, ask: "How bad is the pain?"  (Scale 1-10; or mild, moderate, severe)     8 6. OTHER SYMPTOMS: "Do you have any other symptoms?" (e.g., fever)     Diarrhea from gabapentin  7. PREGNANCY: "Is there any chance you are pregnant?" "When was your last menstrual period?"     na  Protocols used: [facility]

## 2019-11-10 NOTE — Telephone Encounter (Signed)
Patient was called and informed of medication being sent to pharmacy. 

## 2019-11-10 NOTE — Addendum Note (Signed)
Addended byCharlott Rakes on: 11/10/2019 02:40 PM   Modules accepted: Orders

## 2020-01-05 ENCOUNTER — Other Ambulatory Visit: Payer: Self-pay

## 2020-01-05 ENCOUNTER — Telehealth: Payer: Self-pay | Admitting: Family Medicine

## 2020-01-05 ENCOUNTER — Ambulatory Visit
Admission: RE | Admit: 2020-01-05 | Discharge: 2020-01-05 | Disposition: A | Discharge status: Home / Self Care | Payer: Medicare Other | Source: Ambulatory Visit | Attending: Family Medicine | Admitting: Family Medicine

## 2020-01-05 ENCOUNTER — Telehealth: Payer: Self-pay

## 2020-01-05 DIAGNOSIS — E2839 Other primary ovarian failure: Secondary | ICD-10-CM

## 2020-01-05 DIAGNOSIS — Z1231 Encounter for screening mammogram for malignant neoplasm of breast: Secondary | ICD-10-CM | POA: Diagnosis not present

## 2020-01-05 DIAGNOSIS — Z780 Asymptomatic menopausal state: Secondary | ICD-10-CM | POA: Diagnosis not present

## 2020-01-05 DIAGNOSIS — M85852 Other specified disorders of bone density and structure, left thigh: Secondary | ICD-10-CM | POA: Diagnosis not present

## 2020-01-05 NOTE — Telephone Encounter (Signed)
Pt was told today after bone density results given, she should take calcium and vit D. Pt wants to know if there is a Rx that can be prescribed for calcium and vit D together that she can get from [facility].  If not, pt wants to know how much calcium and vit d she should take.

## 2020-01-05 NOTE — Telephone Encounter (Signed)
Patient was called and informed that she can get OTC calcium and Vit d.

## 2020-01-05 NOTE — Telephone Encounter (Signed)
-----   Message from Charlott Rakes, MD sent at 01/05/2020  1:07 PM EDT ----- Bone density reveals slight bone thinning but no osteoporosis.  Advised to take calcium and vitamin D for bone strength.

## 2020-01-05 NOTE — Telephone Encounter (Signed)
Patient notified of results and recommendations.

## 2020-01-05 NOTE — Telephone Encounter (Signed)
Patient was called and a voicemail was left informing patient to return phone call for lab results. 

## 2020-01-05 NOTE — Telephone Encounter (Signed)
Patient is requesting recommendations for Vit D and calcium- sent for PCP advise

## 2020-01-06 ENCOUNTER — Telehealth: Payer: Self-pay

## 2020-01-06 NOTE — Telephone Encounter (Signed)
-----   Message from Charlott Rakes, MD sent at 01/06/2020  1:44 PM EDT ----- Mammogram is negative for malignancy

## 2020-01-06 NOTE — Telephone Encounter (Signed)
Patient name and DOB has been verified Patient was informed of lab results. Patient had no questions.  

## 2020-01-15 ENCOUNTER — Other Ambulatory Visit: Payer: Self-pay | Admitting: Family Medicine

## 2020-01-15 DIAGNOSIS — I10 Essential (primary) hypertension: Secondary | ICD-10-CM

## 2020-01-15 DIAGNOSIS — E782 Mixed hyperlipidemia: Secondary | ICD-10-CM

## 2020-01-15 NOTE — Telephone Encounter (Signed)
Requested Prescriptions  Pending Prescriptions Disp Refills  . lisinopril-hydrochlorothiazide (ZESTORETIC) 10-12.5 MG tablet [Pharmacy Med Name: Lisinopril-hydroCHLOROthiazide 10-12.5 MG Oral Tablet] 90 tablet 3    Sig: TAKE 1 TABLET BY MOUTH  DAILY     Cardiovascular:  ACEI + Diuretic Combos Failed - 01/15/2020  7:25 AM      Failed - Cr in normal range and within 180 days    Creat  Date Value Ref Range Status  05/07/2016 1.11 (H) 0.50 - 0.99 mg/dL Final    Comment:      For patients > or = 65 years of age: The upper reference limit for Creatinine is approximately 13% higher for people identified as African-American.      Creatinine, Ser  Date Value Ref Range Status  08/10/2019 1.69 (H) 0.57 - 1.00 mg/dL Final         Passed - Na in normal range and within 180 days    Sodium  Date Value Ref Range Status  08/10/2019 139 134 - 144 mmol/L Final         Passed - K in normal range and within 180 days    Potassium  Date Value Ref Range Status  08/10/2019 4.5 3.5 - 5.2 mmol/L Final         Passed - Ca in normal range and within 180 days    Calcium  Date Value Ref Range Status  08/10/2019 9.6 8.7 - 10.3 mg/dL Final   Calcium, Ion  Date Value Ref Range Status  08/06/2009 1.13 1.12 - 1.32 mmol/L Final         Passed - Patient is not pregnant      Passed - Last BP in normal range    BP Readings from Last 1 Encounters:  10/11/19 115/72         Passed - Valid encounter within last 6 months    Recent Outpatient Visits          3 months ago Tobacco abuse   [place], Charlane Ferretti, MD   5 months ago Other specified hypothyroidism   [place], Charlane Ferretti, MD   11 months ago Annual physical exam   [place], Enobong, MD   1 year ago Screening for diabetes mellitus   [place], Enobong, MD   1 year ago Mixed hyperlipidemia    [place], Enobong, MD      Future Appointments            In 2 months Charlott Rakes, MD [place]           . carvedilol (COREG) 12.5 MG tablet [Pharmacy Med Name: Carvedilol 12.5 MG Oral Tablet] 180 tablet 3    Sig: TAKE 1 TABLET BY MOUTH  TWICE DAILY WITH A MEAL     Cardiovascular:  Beta Blockers Passed - 01/15/2020  7:25 AM      Passed - Last BP in normal range    BP Readings from Last 1 Encounters:  10/11/19 115/72         Passed - Last Heart Rate in normal range    Pulse Readings from Last 1 Encounters:  10/11/19 64         Passed - Valid encounter within last 6 months    Recent Outpatient Visits          3 months  ago Tobacco abuse   [place], Gideon, MD   5 months ago Other specified hypothyroidism   [place], Enobong, MD   11 months ago Annual physical exam   [place], Enobong, MD   1 year ago Screening for diabetes mellitus   [place], Enobong, MD   1 year ago Mixed hyperlipidemia   [place], Enobong, MD      Future Appointments            In 2 months Charlott Rakes, MD [place]           . atorvastatin (LIPITOR) 80 MG tablet [Pharmacy Med Name: Atorvastatin Calcium 80 MG Oral Tablet] 90 tablet 3    Sig: TAKE 1 TABLET BY MOUTH  DAILY     Cardiovascular:  Antilipid - Statins Failed - 01/15/2020  7:25 AM      Failed - LDL in normal range and within 360 days    LDL Chol Calc (NIH)  Date Value Ref Range Status  08/10/2019 93 0 - 99 mg/dL Final         Failed - Triglycerides in normal range and within 360 days    Triglycerides  Date Value Ref Range Status  08/10/2019 230 (H) 0 - 149 mg/dL Final         Passed - Total Cholesterol in normal range  and within 360 days    Cholesterol, Total  Date Value Ref Range Status  08/10/2019 180 100 - 199 mg/dL Final         Passed - HDL in normal range and within 360 days    HDL  Date Value Ref Range Status  08/10/2019 48 >39 mg/dL Final         Passed - Patient is not pregnant      Passed - Valid encounter within last 12 months    Recent Outpatient Visits          3 months ago Tobacco abuse   [place], Charlane Ferretti, MD   5 months ago Other specified hypothyroidism   [place], Charlane Ferretti, MD   11 months ago Annual physical exam   [place], Enobong, MD   1 year ago Screening for diabetes mellitus   [place], Enobong, MD   1 year ago Mixed hyperlipidemia   [place], Enobong, MD      Future Appointments            In 2 months Charlott Rakes, MD [place]

## 2020-01-18 ENCOUNTER — Other Ambulatory Visit: Payer: Self-pay | Admitting: Family Medicine

## 2020-01-18 DIAGNOSIS — I10 Essential (primary) hypertension: Secondary | ICD-10-CM

## 2020-01-26 DIAGNOSIS — M545 Low back pain, unspecified: Secondary | ICD-10-CM | POA: Diagnosis not present

## 2020-01-26 DIAGNOSIS — M533 Sacrococcygeal disorders, not elsewhere classified: Secondary | ICD-10-CM | POA: Diagnosis not present

## 2020-02-12 ENCOUNTER — Other Ambulatory Visit: Payer: Self-pay | Admitting: Family Medicine

## 2020-02-12 DIAGNOSIS — I10 Essential (primary) hypertension: Secondary | ICD-10-CM

## 2020-02-12 NOTE — Telephone Encounter (Signed)
Requested Prescriptions  Pending Prescriptions Disp Refills  . carvedilol (COREG) 12.5 MG tablet [Pharmacy Med Name: Carvedilol 12.5 MG Oral Tablet] 180 tablet 0    Sig: TAKE 1 TABLET BY MOUTH  TWICE DAILY WITH A MEAL     Cardiovascular:  Beta Blockers Failed - 02/12/2020  9:31 AM      Failed - Valid encounter within last 6 months    Recent Outpatient Visits          4 months ago Tobacco abuse   [place], Charlane Ferretti, MD   6 months ago Other specified hypothyroidism   [place], Charlane Ferretti, MD   1 year ago Annual physical exam   [place], Enobong, MD   1 year ago Screening for diabetes mellitus   [place], Enobong, MD   1 year ago Mixed hyperlipidemia   [place], Enobong, MD      Future Appointments            In 1 month Charlott Rakes, MD [place] BP in normal range    BP Readings from Last 1 Encounters:  10/11/19 115/72         Passed - Last Heart Rate in normal range    Pulse Readings from Last 1 Encounters:  10/11/19 64

## 2020-02-13 ENCOUNTER — Other Ambulatory Visit: Payer: Self-pay | Admitting: Family Medicine

## 2020-02-13 DIAGNOSIS — E038 Other specified hypothyroidism: Secondary | ICD-10-CM

## 2020-04-10 ENCOUNTER — Encounter: Payer: Self-pay | Admitting: Family Medicine

## 2020-04-10 ENCOUNTER — Ambulatory Visit: Payer: Medicare Other | Attending: Family Medicine | Admitting: Family Medicine

## 2020-04-10 ENCOUNTER — Other Ambulatory Visit: Payer: Self-pay

## 2020-04-10 VITALS — BP 115/68 | HR 58 | Ht 62.0 in | Wt 155.0 lb

## 2020-04-10 DIAGNOSIS — E038 Other specified hypothyroidism: Secondary | ICD-10-CM

## 2020-04-10 DIAGNOSIS — M19041 Primary osteoarthritis, right hand: Secondary | ICD-10-CM | POA: Diagnosis not present

## 2020-04-10 DIAGNOSIS — F419 Anxiety disorder, unspecified: Secondary | ICD-10-CM | POA: Diagnosis not present

## 2020-04-10 DIAGNOSIS — I10 Essential (primary) hypertension: Secondary | ICD-10-CM

## 2020-04-10 DIAGNOSIS — Z23 Encounter for immunization: Secondary | ICD-10-CM

## 2020-04-10 DIAGNOSIS — F32A Depression, unspecified: Secondary | ICD-10-CM

## 2020-04-10 DIAGNOSIS — M19042 Primary osteoarthritis, left hand: Secondary | ICD-10-CM

## 2020-04-10 MED ORDER — HYDROXYZINE HCL 25 MG PO TABS: 25.0000 mg | ORAL_TABLET | Freq: Three times a day (TID) | ORAL | 1 refills | Status: DC | PRN

## 2020-04-10 MED ORDER — GABAPENTIN 300 MG PO CAPS
300.0000 mg | ORAL_CAPSULE | Freq: Two times a day (BID) | ORAL | 1 refills | Status: DC
Start: 2020-04-10 — End: 2020-10-09

## 2020-04-10 MED ORDER — DICLOFENAC SODIUM 1 % EX GEL: 2.0000 g | Freq: Four times a day (QID) | CUTANEOUS | 1 refills | Status: DC

## 2020-04-10 MED ORDER — CARVEDILOL 12.5 MG PO TABS: ORAL_TABLET | ORAL | 1 refills | Status: DC

## 2020-04-10 NOTE — Progress Notes (Signed)
Wants to discuss shingles vaccine. Has a lot of stress at home. Having pains in joints of both hands. Taking calcium supplements.

## 2020-04-10 NOTE — Patient Instructions (Signed)
Diclofenac skin gel What is this medicine? DICLOFENAC (dye KLOE fen ak) is a non-steroidal anti-inflammatory drug (NSAID). The 1% skin gel is used to treat osteoarthritis of the hands or knees. The 3% skin gel is used to treat actinic keratosis. This medicine may be used for other purposes; ask your health care provider or pharmacist if you have questions. COMMON BRAND NAME(S): DSG Pak, Omeca, Solaravix, Solaraze, Voltaren Arthritis, Voltaren Gel What should I tell my health care provider before I take this medicine? They need to know if you have any of these conditions:  asthma  bleeding problems  coronary artery bypass graft (CABG) surgery within the past 2 weeks  heart disease  high blood pressure  if you frequently drink alcohol containing drinks  kidney disease  liver disease  open or infected skin  stomach problems  an unusual or allergic reaction to diclofenac, aspirin, other NSAIDs, other medicines, benzyl alcohol (3% gel only), foods, dyes, or preservatives  pregnant or trying to get pregnant  breast-feeding How should I use this medicine? This medicine is for external use only. Follow the directions on the prescription label. Wash hands before and after use. Do not get this medicine in your eyes. If you do, rinse out with plenty of cool tap water. Use your doses at regular intervals. Do not use your medicine more often than directed. A special MedGuide will be given to you by the pharmacist with each prescription and refill of the 1% gel. Be sure to read this information carefully each time. Talk to your pediatrician regarding the use of this medicine in children. Special care may be needed. The 3% gel is not approved for use in children. Overdosage: If you think you have taken too much of this medicine contact a poison control center or emergency room at once. NOTE: This medicine is only for you. Do not share this medicine with others. What if I miss a dose? If you  miss a dose, use it as soon as you can. If it is almost time for your next dose, use only that dose. Do not use double or extra doses. What may interact with this medicine?  aspirin  NSAIDs, medicines for pain and inflammation, like ibuprofen or naproxen Do not use any other skin products without telling your doctor or health care professional. This list may not describe all possible interactions. Give your health care provider a list of all the medicines, herbs, non-prescription drugs, or dietary supplements you use. Also tell them if you smoke, drink alcohol, or use illegal drugs. Some items may interact with your medicine. What should I watch for while using this medicine? Tell your doctor or healthcare provider if your symptoms do not start to get better or if they get worse. You will need to follow up with your healthcare provider to monitor your progress. You may need to be treated for up to 3 months if you are using the 3% gel, but the full effect may not occur until 1 month after stopping treatment. If you develop a severe skin reaction, contact your doctor or healthcare provider immediately. This medicine may cause serious skin reactions. They can happen weeks to months after starting the medicine. Contact your healthcare provider right away if you notice fevers or flu-like symptoms with a rash. The rash may be red or purple and then turn into blisters or peeling of the skin. Or, you might notice a red rash with swelling of the face, lips or lymph nodes in  your neck or under your arms. This medicine can make you more sensitive to the sun. Keep out of the sun. If you cannot avoid being in the sun, wear protective clothing and use sunscreen. Do not use sun lamps or tanning beds/booths. Do not take medicines such as ibuprofen and naproxen with this medicine. Side effects such as stomach upset, nausea, or ulcers may be more likely to occur. Many medicines available without a prescription should not  be taken with this medicine. This medicine does not prevent heart attack or stroke. In fact, this medicine may increase the chance of a heart attack or stroke. The chance may increase with longer use of this medicine and in people who have heart disease. If you take aspirin to prevent heart attack or stroke, talk with your doctor or healthcare provider. This medicine can cause ulcers and bleeding in the stomach and intestines at any time during treatment. Do not smoke cigarettes or drink alcohol. These increase irritation to your stomach and can make it more susceptible to damage from this medicine. Ulcers and bleeding can happen without warning symptoms and can cause death. You may get drowsy or dizzy. Do not drive, use machinery, or do anything that needs mental alertness until you know how this medicine affects you. Do not stand or sit up quickly, especially if you are an older patient. This reduces the risk of dizzy or fainting spells. This medicine can cause you to bleed more easily. Try to avoid damage to your teeth and gums when you brush or floss your teeth. What side effects may I notice from receiving this medicine? Side effects that you should report to your doctor or health care professional as soon as possible:  allergic reactions like skin rash, itching or hives, swelling of the face, lips, or tongue  black or bloody stools, blood in the urine or vomit  blurred vision  chest pain  difficulty breathing or wheezing  nausea or vomiting  rash, fever, and swollen lymph nodes  redness, blistering, peeling or loosening of the skin, including inside the mouth  slurred speech or weakness on one side of the body  trouble passing urine or change in the amount of urine  unexplained weight gain or swelling  unusually weak or tired  yellowing of eyes or skin Side effects that usually do not require medical attention (report to your doctor or health care professional if they continue  or are bothersome):  dizziness  dry skin  headache  heartburn  increased sensitivity to the sun  stomach pain  tingling at the application site This list may not describe all possible side effects. Call your doctor for medical advice about side effects. You may report side effects to FDA at 1-800-FDA-1088. Where should I keep my medicine? Keep out of the reach of children. Store the 1% gel at room temperature between 15 and 30 degrees C (59 and 86 degrees F). Store the 3% gel at room temperature between 20 and 25 degrees C (68 and 77 degrees F). Protect from light. Throw away any unused medicine after the expiration date. NOTE: This sheet is a summary. It may not cover all possible information. If you have questions about this medicine, talk to your doctor, pharmacist, or health care provider.  2020 Elsevier/Gold Standard (2018-06-16 13:05:18)  

## 2020-04-10 NOTE — Progress Notes (Signed)
Subjective:  Patient ID: Danielle Berger, female    DOB: January 08, 1956  Age: 64 y.o. MRN: [id number]  CC: Hypothyroidism   HPI Danielle Berger is a64 year old female with a history of tobacco abuse, hypertension, hyperlipidemia, hypothyroidism who comes into the clinic for a follow-up visit.  She would like to receive the Shingles vaccine Previously had Shingles behind R ear and now she has been itching and is wondering if this is a recurrence.  Complains of pain in her hands especially with the cold weather She complains of stress at home and is depressed and anxious. States symptoms would not resolve until she gets out of her home but unfortunately she cannot afford to as she does not have the means.  Denies suicidal ideation or intent but endorses presence of anhedonia, lack of motivation. Compliant with her antihypertensive and statin and is doing well on levothyroxine.  Past Medical History:  Diagnosis Date  . Chest pain 07/17/2015  . Chronic renal disease    AS A CHILD  . Esophageal reflux    PT. DENIES 02/15/19  . Headache   . HLD (hyperlipidemia)   . Hypertension   . Hypothyroidism   . Tobacco abuse 07/17/2015    Past Surgical History:  Procedure Laterality Date  . Brain Tumor removed      when patient was in the 6th grade  . KIDNEY SURGERY     when patient was int he 8th grade    Family History  Adopted: Yes    Allergies  Allergen Reactions  . Codeine Nausea And Vomiting  . Penicillins Rash    Outpatient Medications Prior to Visit  Medication Sig Dispense Refill  . amitriptyline (ELAVIL) 25 MG tablet Take 1 tablet (25 mg total) by mouth at bedtime as needed for sleep. For shingles pain 30 tablet 0  . aspirin EC 81 MG EC tablet Take 1 tablet (81 mg total) by mouth daily.    Marland Kitchen atorvastatin (LIPITOR) 80 MG tablet TAKE 1 TABLET BY MOUTH  DAILY 90 tablet 3  . levothyroxine (SYNTHROID) 88 MCG tablet TAKE 1 TABLET BY MOUTH  DAILY BEFORE BREAKFAST 90  tablet 1  . lidocaine (LIDODERM) 5 % Place 1 patch onto the skin daily. Remove & Discard patch within 12 hours or as directed by MD 30 patch 0  . lisinopril-hydrochlorothiazide (ZESTORETIC) 10-12.5 MG tablet TAKE 1 TABLET BY MOUTH  DAILY 90 tablet 3  . Omega-3 Fatty Acids (FISH OIL PO) Take by mouth.    . carvedilol (COREG) 12.5 MG tablet TAKE 1 TABLET BY MOUTH  TWICE DAILY WITH A MEAL 180 tablet 0  . gabapentin (NEURONTIN) 300 MG capsule Take 1 capsule (300 mg total) by mouth 2 (two) times daily. 60 capsule 0  . loratadine (CLARITIN) 10 MG tablet Take 1 tablet (10 mg total) by mouth daily as needed for allergies. 30 tablet 0   No facility-administered medications prior to visit.     ROS Review of Systems  Constitutional: Negative for activity change, appetite change and fatigue.  HENT: Negative for congestion, sinus pressure and sore throat.   Eyes: Negative for visual disturbance.  Respiratory: Negative for cough, chest tightness, shortness of breath and wheezing.   Cardiovascular: Negative for chest pain and palpitations.  Gastrointestinal: Negative for abdominal distention, abdominal pain and constipation.  Endocrine: Negative for polydipsia.  Genitourinary: Negative for dysuria and frequency.  Musculoskeletal:       See HPI  Skin: Negative for rash.  Neurological: Negative for  tremors, light-headedness and numbness.  Hematological: Does not bruise/bleed easily.  Psychiatric/Behavioral: Positive for dysphoric mood. Negative for agitation and behavioral problems.    Objective:  BP 115/68   Pulse (!) 58   Ht _0  (1.575 m)   Wt 155 lb (70.3 kg)   SpO2 100%   BMI 28.35 kg/m   BP/Weight 04/10/2020 10/11/2019 7/91/5056  Systolic BP 979 480 165  Diastolic BP 68 72 67  Wt. (Lbs) 155 153.4 151  BMI 28.35 28.06 27.62      Physical Exam Constitutional:      Appearance: She is well-developed.  Neck:     Vascular: No JVD.  Cardiovascular:     Rate and Rhythm: Bradycardia  present.     Heart sounds: Normal heart sounds. No murmur heard.   Pulmonary:     Effort: Pulmonary effort is normal.     Breath sounds: Normal breath sounds. No wheezing or rales.  Chest:     Chest wall: No tenderness.  Abdominal:     General: Bowel sounds are normal. There is no distension.     Palpations: Abdomen is soft. There is no mass.     Tenderness: There is no abdominal tenderness.  Musculoskeletal:        General: Normal range of motion.     Right lower leg: No edema.     Left lower leg: No edema.     Comments: Normal appearance of hands, no tenderness, able to make a fist  Neurological:     Mental Status: She is alert and oriented to person, place, and time.  Psychiatric:     Comments: Dysphoric mood     CMP Latest Ref Rng & Units 08/10/2019 12/14/2018 02/22/2018  Glucose 65 - 99 mg/dL 85 103(H) 103(H)  BUN 8 - 27 mg/dL 23 29(H) 26  Creatinine 0.57 - 1.00 mg/dL 1.69(H) 1.87(H) 1.41(H)  Sodium 134 - 144 mmol/L 139 137 138  Potassium 3.5 - 5.2 mmol/L 4.5 5.0 4.9  Chloride 96 - 106 mmol/L 100 99 98  CO2 20 - 29 mmol/L _1 Calcium 8.7 - 10.3 mg/dL 9.6 9.2 9.7  Total Protein 6.0 - 8.5 g/dL 7.0 6.9 7.1  Total Bilirubin 0.0 - 1.2 mg/dL 0.4 0.3 0.4  Alkaline Phos 39 - 117 IU/L 105 95 117  AST 0 - 40 IU/L _2 ALT 0 - 32 IU/L _3 Lipid Panel     Component Value Date/Time   CHOL 180 08/10/2019 1424   TRIG 230 (H) 08/10/2019 1424   HDL 48 08/10/2019 1424   CHOLHDL 3.8 08/10/2019 1424   CHOLHDL 3.7 05/07/2016 0946   VLDL 34 (H) 05/07/2016 0946   LDLCALC 93 08/10/2019 1424    CBC    Component Value Date/Time   WBC 6.9 10/11/2019 1208   WBC 7.6 07/17/2015 1011   RBC 4.16 10/11/2019 1208   RBC 5.08 07/17/2015 1011   HGB 12.9 10/11/2019 1208   HCT 38.0 10/11/2019 1208   PLT 300 10/11/2019 1208   MCV 91 10/11/2019 1208   MCH 31.0 10/11/2019 1208   MCH 28.3 07/17/2015 1011   MCHC 33.9 10/11/2019 1208   MCHC 32.0 07/17/2015 1011   RDW 12.8  10/11/2019 1208   LYMPHSABS 1.9 10/11/2019 1208   MONOABS 0.5 07/17/2015 1011   EOSABS 0.2 10/11/2019 1208   BASOSABS 0.1 10/11/2019 1208    Lab Results  Component Value Date   HGBA1C 5.8 (H) 12/14/2018  Assessment & Plan:   1. Essential hypertension Controlled Counseled on blood pressure goal of less than 130/80, low-sodium, DASH diet, medication compliance, 150 minutes of moderate intensity exercise per week. Discussed medication compliance, adverse effects. - CMP14+EGFR - carvedilol (COREG) 12.5 MG tablet; TAKE 1 TABLET BY MOUTH  TWICE DAILY WITH A MEAL  Dispense: 180 tablet; Refill: 1  2. Other specified hypothyroidism Controlled We will send of thyroid panel and adjust regimen accordingly - T4, free - TSH  3. Anxiety and depression Uncontrolled Due to underlying stressors at home She declines a daily medication and would rather take something as needed We will place on LCSW schedule she will benefit from therapy and discussion of available resources for support - hydrOXYzine (ATARAX/VISTARIL) 25 MG tablet; Take 1 tablet (25 mg total) by mouth 3 (three) times daily as needed for anxiety.  Dispense: 60 tablet; Refill: 1  4. Primary osteoarthritis of both hands Likely underlying osteoarthritis Placed on topical NSAID - diclofenac Sodium (VOLTAREN) 1 % GEL; Apply 2 g topically 4 (four) times daily.  Dispense: 100 g; Refill: 1  Health Care Maintenance: Zostavax administered Meds ordered this encounter  Medications  . diclofenac Sodium (VOLTAREN) 1 % GEL    Sig: Apply 2 g topically 4 (four) times daily.    Dispense:  100 g    Refill:  1  . hydrOXYzine (ATARAX/VISTARIL) 25 MG tablet    Sig: Take 1 tablet (25 mg total) by mouth 3 (three) times daily as needed for anxiety.    Dispense:  60 tablet    Refill:  1  . gabapentin (NEURONTIN) 300 MG capsule    Sig: Take 1 capsule (300 mg total) by mouth 2 (two) times daily.    Dispense:  180 capsule    Refill:  1  .  carvedilol (COREG) 12.5 MG tablet    Sig: TAKE 1 TABLET BY MOUTH  TWICE DAILY WITH A MEAL    Dispense:  180 tablet    Refill:  1    Follow-up: Return for Virtual visit with Jasmine-anxiety and depression; 6 months with PCP.       Charlott Rakes, MD, FAAFP. [facility] and [facility], [place]   04/10/2020, 12:15 PM

## 2020-04-11 ENCOUNTER — Other Ambulatory Visit: Payer: Self-pay | Admitting: Family Medicine

## 2020-04-11 DIAGNOSIS — E038 Other specified hypothyroidism: Secondary | ICD-10-CM

## 2020-04-11 LAB — CMP14+EGFR
ALT: 29 IU/L (ref 0–32)
AST: 29 IU/L (ref 0–40)
Albumin/Globulin Ratio: 2.4 — ABNORMAL HIGH (ref 1.2–2.2)
Albumin: 4.8 g/dL (ref 3.8–4.8)
Alkaline Phosphatase: 97 IU/L (ref 44–121)
BUN/Creatinine Ratio: 15 (ref 12–28)
BUN: 22 mg/dL (ref 8–27)
Bilirubin Total: 0.3 mg/dL (ref 0.0–1.2)
CO2: 21 mmol/L (ref 20–29)
Calcium: 9.5 mg/dL (ref 8.7–10.3)
Chloride: 101 mmol/L (ref 96–106)
Creatinine, Ser: 1.43 mg/dL — ABNORMAL HIGH (ref 0.57–1.00)
GFR calc Af Amer: 44 mL/min/{1.73_m2} — ABNORMAL LOW (ref >59)
GFR calc non Af Amer: 38 mL/min/{1.73_m2} — ABNORMAL LOW (ref >59)
Globulin, Total: 2 g/dL (ref 1.5–4.5)
Glucose: 102 mg/dL — ABNORMAL HIGH (ref 65–99)
Potassium: 4.7 mmol/L (ref 3.5–5.2)
Sodium: 140 mmol/L (ref 134–144)
Total Protein: 6.8 g/dL (ref 6.0–8.5)

## 2020-04-11 LAB — T4, FREE: Free T4: 1.6 ng/dL (ref 0.82–1.77)

## 2020-04-11 LAB — TSH: TSH: 2.14 u[IU]/mL (ref 0.450–4.500)

## 2020-04-11 MED ORDER — LEVOTHYROXINE SODIUM 88 MCG PO TABS: 88.0000 ug | ORAL_TABLET | Freq: Every day | ORAL | 1 refills | Status: DC

## 2020-04-12 ENCOUNTER — Telehealth: Payer: Self-pay

## 2020-04-12 NOTE — Telephone Encounter (Signed)
-----   Message from Hoy Register, MD sent at 04/11/2020  2:02 PM EST ----- Thyroid lab is normal, continue current dose of levothyroxine. Kidney function has improved from previous labs but is still slightly abnormal. Please advised to abstain from medications like Aleve and ibuprofen.

## 2020-04-12 NOTE — Telephone Encounter (Signed)
Patient name and DOB has been verified Patient was informed of lab results. Patient had no questions.  

## 2020-04-30 ENCOUNTER — Ambulatory Visit: Payer: Medicare Other | Attending: Family Medicine | Admitting: Licensed Clinical Social Worker

## 2020-04-30 ENCOUNTER — Telehealth: Payer: Self-pay | Admitting: Licensed Clinical Social Worker

## 2020-04-30 ENCOUNTER — Other Ambulatory Visit: Payer: Self-pay

## 2020-04-30 NOTE — Telephone Encounter (Signed)
Call placed to patient regarding scheduled [facility] appointment. LCSW left message requesting a return call.  

## 2020-06-11 ENCOUNTER — Other Ambulatory Visit: Payer: Self-pay

## 2020-06-11 ENCOUNTER — Ambulatory Visit: Payer: Medicare Other | Attending: Family Medicine | Admitting: Pharmacist

## 2020-06-11 DIAGNOSIS — Z23 Encounter for immunization: Secondary | ICD-10-CM | POA: Diagnosis not present

## 2020-06-11 NOTE — Progress Notes (Signed)
Patient presents for vaccination against zoster per orders of Dr. Newlin. Consent given. Counseling provided. No contraindications exists. Vaccine administered without incident.   Luke Van Ausdall, PharmD, BCACP, CPP Clinical Pharmacist [facility] [phone]  

## 2020-10-09 ENCOUNTER — Other Ambulatory Visit: Payer: Self-pay

## 2020-10-09 ENCOUNTER — Encounter: Payer: Self-pay | Admitting: Family Medicine

## 2020-10-09 ENCOUNTER — Ambulatory Visit: Payer: Medicare Other | Attending: Family Medicine | Admitting: Family Medicine

## 2020-10-09 DIAGNOSIS — I700 Atherosclerosis of aorta: Secondary | ICD-10-CM | POA: Diagnosis not present

## 2020-10-09 DIAGNOSIS — I10 Essential (primary) hypertension: Secondary | ICD-10-CM

## 2020-10-09 DIAGNOSIS — E782 Mixed hyperlipidemia: Secondary | ICD-10-CM | POA: Diagnosis not present

## 2020-10-09 DIAGNOSIS — M25559 Pain in unspecified hip: Secondary | ICD-10-CM

## 2020-10-09 DIAGNOSIS — E038 Other specified hypothyroidism: Secondary | ICD-10-CM

## 2020-10-09 MED ORDER — GABAPENTIN 300 MG PO CAPS: 300.0000 mg | ORAL_CAPSULE | Freq: Two times a day (BID) | ORAL | 3 refills | Status: DC

## 2020-10-09 MED ORDER — CYCLOBENZAPRINE HCL 10 MG PO TABS: 10.0000 mg | ORAL_TABLET | Freq: Two times a day (BID) | ORAL | 6 refills | Status: DC | PRN

## 2020-10-09 MED ORDER — ATORVASTATIN CALCIUM 80 MG PO TABS: 80.0000 mg | ORAL_TABLET | Freq: Every day | ORAL | 3 refills | Status: DC

## 2020-10-09 MED ORDER — LISINOPRIL-HYDROCHLOROTHIAZIDE 10-12.5 MG PO TABS: 1.0000 | ORAL_TABLET | Freq: Every day | ORAL | 3 refills | Status: DC

## 2020-10-09 MED ORDER — LEVOTHYROXINE SODIUM 88 MCG PO TABS: 88.0000 ug | ORAL_TABLET | Freq: Every day | ORAL | 1 refills | Status: DC

## 2020-10-09 MED ORDER — CARVEDILOL 12.5 MG PO TABS: ORAL_TABLET | ORAL | 3 refills | Status: DC

## 2020-10-09 NOTE — Progress Notes (Signed)
Virtual Visit via Telephone Note  I connected with Danielle Berger, on 10/09/2020 at 9:17 AM by telephone due to the COVID-19 pandemic and verified that I am speaking with the correct person using two identifiers.   Consent: I discussed the limitations, risks, security and privacy concerns of performing an evaluation and management service by telephone and the availability of in person appointments. I also discussed with the patient that there may be a patient responsible charge related to this service. The patient expressed understanding and agreed to proceed.   Location of Patient: Home  Location of Provider: Clinic   Persons participating in Telemedicine visit: Danielle Berger Dr. Margarita Rana     History of Present Illness: Danielle Berger is a 65 year old female with a history of tobacco abuse, hypertension, hyperlipidemia, hypothyroidism who comes into the clinic for a follow-up visit.   Her L hip has been hurting for 1 week out of the blues and now radiates to her inner thigh with difficulty walking sometimes. Pain is "like a tooth ache pain".  Denies presence of falls, presence of back pain and no preceding history of trauma. Requests refill of her antihypertensive and her statin medication. Compliant with her thyroid medication.  Past Medical History:  Diagnosis Date   Chest pain 07/17/2015   Chronic renal disease    AS A CHILD   Esophageal reflux    PT. DENIES 02/15/19   Headache    HLD (hyperlipidemia)    Hypertension    Hypothyroidism    Tobacco abuse 07/17/2015   Allergies  Allergen Reactions   Codeine Nausea And Vomiting   Penicillins Rash    Current Outpatient Medications on File Prior to Visit  Medication Sig Dispense Refill   amitriptyline (ELAVIL) 25 MG tablet Take 1 tablet (25 mg total) by mouth at bedtime as needed for sleep. For shingles pain 30 tablet 0   aspirin EC 81 MG EC tablet Take 1 tablet (81 mg total) by mouth daily.      atorvastatin (LIPITOR) 80 MG tablet TAKE 1 TABLET BY MOUTH  DAILY 90 tablet 3   carvedilol (COREG) 12.5 MG tablet TAKE 1 TABLET BY MOUTH  TWICE DAILY WITH A MEAL 180 tablet 1   diclofenac Sodium (VOLTAREN) 1 % GEL Apply 2 g topically 4 (four) times daily. 100 g 1   gabapentin (NEURONTIN) 300 MG capsule Take 1 capsule (300 mg total) by mouth 2 (two) times daily. 180 capsule 1   hydrOXYzine (ATARAX/VISTARIL) 25 MG tablet Take 1 tablet (25 mg total) by mouth 3 (three) times daily as needed for anxiety. 60 tablet 1   levothyroxine (SYNTHROID) 88 MCG tablet Take 1 tablet (88 mcg total) by mouth daily before breakfast. 90 tablet 1   lidocaine (LIDODERM) 5 % Place 1 patch onto the skin daily. Remove & Discard patch within 12 hours or as directed by MD 30 patch 0   lisinopril-hydrochlorothiazide (ZESTORETIC) 10-12.5 MG tablet TAKE 1 TABLET BY MOUTH  DAILY 90 tablet 3   Omega-3 Fatty Acids (FISH OIL PO) Take by mouth.     No current facility-administered medications on file prior to visit.    ROS: See HPI  Observations/Objective: Awake, alert, oriented x3 Not in acute distress Normal mood   Lipid Panel     Component Value Date/Time   CHOL 180 08/10/2019 1424   TRIG 230 (H) 08/10/2019 1424   HDL 48 08/10/2019 1424   CHOLHDL 3.8 08/10/2019 1424   CHOLHDL 3.7 05/07/2016 0946  VLDL 34 (H) 05/07/2016 0946   LDLCALC 93 08/10/2019 1424   LABVLDL 39 08/10/2019 1424    CMP Latest Ref Rng & Units 04/10/2020 08/10/2019 12/14/2018  Glucose 65 - 99 mg/dL 102(H) 85 103(H)  BUN 8 - 27 mg/dL 22 23 29(H)  Creatinine 0.57 - 1.00 mg/dL 1.43(H) 1.69(H) 1.87(H)  Sodium 134 - 144 mmol/L 140 139 137  Potassium 3.5 - 5.2 mmol/L 4.7 4.5 5.0  Chloride 96 - 106 mmol/L 101 100 99  CO2 20 - 29 mmol/L 21 23 24   Calcium 8.7 - 10.3 mg/dL 9.5 9.6 9.2  Total Protein 6.0 - 8.5 g/dL 6.8 7.0 6.9  Total Bilirubin 0.0 - 1.2 mg/dL 0.3 0.4 0.3  Alkaline Phos 44 - 121 IU/L 97 105 95  AST 0 - 40 IU/L 29 22 23   ALT 0 -  32 IU/L 29 16 20     Lab Results  Component Value Date   HGBA1C 5.8 (H) 12/14/2018    Lab Results  Component Value Date   TSH 2.140 04/10/2020    Assessment and Plan: 1. Essential hypertension Controlled Counseled on blood pressure goal of less than 130/80, low-sodium, DASH diet, medication compliance, 150 minutes of moderate intensity exercise per week. Discussed medication compliance, adverse effects. - lisinopril-hydrochlorothiazide (ZESTORETIC) 10-12.5 MG tablet; Take 1 tablet by mouth daily.  Dispense: 90 tablet; Refill: 3 - carvedilol (COREG) 12.5 MG tablet; TAKE 1 TABLET BY MOUTH  TWICE DAILY WITH A MEAL  Dispense: 180 tablet; Refill: 3  2. Other specified hypothyroidism Controlled We will check thyroid panel at next visit Continue current dose of levothyroxine - levothyroxine (SYNTHROID) 88 MCG tablet; Take 1 tablet (88 mcg total) by mouth daily before breakfast.  Dispense: 90 tablet; Refill: 1  3. Mixed hyperlipidemia Controlled Low-cholesterol diet - atorvastatin (LIPITOR) 80 MG tablet; Take 1 tablet (80 mg total) by mouth daily.  Dispense: 90 tablet; Refill: 3  4. Hip pain Could be osteoarthritis versus sciatica If pain persist advised she will need an in person office visit for further evaluation. - cyclobenzaprine (FLEXERIL) 10 MG tablet; Take 1 tablet (10 mg total) by mouth 2 (two) times daily as needed for muscle spasms.  Dispense: 60 tablet; Refill: 6 - gabapentin (NEURONTIN) 300 MG capsule; Take 1 capsule (300 mg total) by mouth 2 (two) times daily.  Dispense: 180 capsule; Refill: 3  5. Atherosclerosis of aorta (HCC) Currently on statin Risk factor modification   Follow Up Instructions: 3 months   I discussed the assessment and treatment plan with the patient. The patient was provided an opportunity to ask questions and all were answered. The patient agreed with the plan and demonstrated an understanding of the instructions.   The patient was advised to  call back or seek an in-person evaluation if the symptoms worsen or if the condition fails to improve as anticipated.     I provided 13 minutes total of non-face-to-face time during this encounter.   Charlott Rakes, MD, FAAFP. [facility] and [facility], [place]   10/09/2020, 9:17 AM

## 2020-10-26 ENCOUNTER — Ambulatory Visit (INDEPENDENT_AMBULATORY_CARE_PROVIDER_SITE_OTHER): Payer: Medicare Other | Admitting: Nurse Practitioner

## 2020-10-26 VITALS — BP 155/64 | HR 63 | Temp 97.9°F | Resp 16 | Ht 62.0 in | Wt 154.0 lb

## 2020-10-26 DIAGNOSIS — Z0000 Encounter for general adult medical examination without abnormal findings: Secondary | ICD-10-CM

## 2020-10-26 NOTE — Progress Notes (Signed)
Subjective:   Danielle Berger is a 65 y.o. female who presents for Medicare Annual (Subsequent) preventive examination.  Review of Systems    Review of Systems  Constitutional: Negative.   HENT: Negative.    Eyes: Negative.   Respiratory: Negative.    Cardiovascular: Negative.   Gastrointestinal: Negative.   Genitourinary: Negative.   Musculoskeletal: Negative.   Skin: Negative.   Neurological: Negative.   Endo/Heme/Allergies: Negative.   Psychiatric/Behavioral: Negative.     Cardiac Risk Factors include: advanced age (>33men, >73 women);smoking/ tobacco exposure     Objective:    Today's Vitals   10/26/20 0850  BP: (!) 155/64  Pulse: 63  Resp: 16  Temp: 97.9 F (36.6 C)  SpO2: 99%  Weight: 154 lb (69.9 kg)  Height: 5\' 2"  (1.575 m)   Body mass index is 28.17 kg/m.  Advanced Directives 10/26/2020 10/11/2019 10/11/2019 02/22/2018 11/11/2016 08/08/2016 05/07/2016  Does Patient Have a Medical Advance Directive? No No No No No No No  Would patient like information on creating a medical advance directive? No - Patient declined - - No - Patient declined - No - Patient declined No - Patient declined    Current Medications (verified) Outpatient Encounter Medications as of 10/26/2020  Medication Sig   aspirin EC 81 MG EC tablet Take 1 tablet (81 mg total) by mouth daily.   atorvastatin (LIPITOR) 80 MG tablet Take 1 tablet (80 mg total) by mouth daily.   carvedilol (COREG) 12.5 MG tablet TAKE 1 TABLET BY MOUTH  TWICE DAILY WITH A MEAL   cyclobenzaprine (FLEXERIL) 10 MG tablet Take 1 tablet (10 mg total) by mouth 2 (two) times daily as needed for muscle spasms.   diclofenac Sodium (VOLTAREN) 1 % GEL Apply 2 g topically 4 (four) times daily.   gabapentin (NEURONTIN) 300 MG capsule Take 1 capsule (300 mg total) by mouth 2 (two) times daily.   hydrOXYzine (ATARAX/VISTARIL) 25 MG tablet Take 1 tablet (25 mg total) by mouth 3 (three) times daily as needed for anxiety.    levothyroxine (SYNTHROID) 88 MCG tablet Take 1 tablet (88 mcg total) by mouth daily before breakfast.   lidocaine (LIDODERM) 5 % Place 1 patch onto the skin daily. Remove & Discard patch within 12 hours or as directed by MD   lisinopril-hydrochlorothiazide (ZESTORETIC) 10-12.5 MG tablet Take 1 tablet by mouth daily.   Omega-3 Fatty Acids (FISH OIL PO) Take by mouth.   No facility-administered encounter medications on file as of 10/26/2020.    Allergies (verified) Codeine and Penicillins   History: Past Medical History:  Diagnosis Date   Chest pain 07/17/2015   Chronic renal disease    AS A CHILD   Esophageal reflux    PT. DENIES 02/15/19   Headache    HLD (hyperlipidemia)    Hypertension    Hypothyroidism    Tobacco abuse 07/17/2015   Past Surgical History:  Procedure Laterality Date   Brain Tumor removed      when patient was in the 6th grade   KIDNEY SURGERY     when patient was int he 8th grade   Family History  Adopted: Yes   Social History   Socioeconomic History   Marital status: Married    Spouse name: Not on file   Number of children: 1   Years of education: Not on file   Highest education level: 12th grade  Occupational History   Not on file  Tobacco Use   Smoking status: Every  Day    Packs/day: 0.50    Types: Cigarettes   Smokeless tobacco: Never  Vaping Use   Vaping Use: Never used  Substance and Sexual Activity   Alcohol use: Yes    Alcohol/week: 2.0 standard drinks    Types: 1 Glasses of wine, 1 Shots of liquor per week    Comment: occ   Drug use: No   Sexual activity: Yes    Birth control/protection: None  Other Topics Concern   Not on file  Social History Narrative   Not on file   Social Determinants of Health   Financial Resource Strain: Low Risk    Difficulty of Paying Living Expenses: Not hard at all  Food Insecurity: No Food Insecurity   Worried About Charity fundraiser in the Last Year: Never true   Ran Out of Food in the Last  Year: Never true  Transportation Needs: No Transportation Needs   Lack of Transportation (Medical): No   Lack of Transportation (Non-Medical): No  Physical Activity: Inactive   Days of Exercise per Week: 0 days   Minutes of Exercise per Session: 0 min  Stress: No Stress Concern Present   Feeling of Stress : Not at all  Social Connections: Socially Integrated   Frequency of Communication with Friends and Family: More than three times a week   Frequency of Social Gatherings with Friends and Family: Once a week   Attends Religious Services: More than 4 times per year   Active Member of [employer] or Organizations: Yes   Attends Music therapist: More than 4 times per year   Marital Status: Married    Tobacco Counseling Ready to quit: No Counseling given: Yes   Clinical Intake:  Pre-visit preparation completed: No  Pain : No/denies pain     BMI - recorded: 28.17 Nutritional Status: BMI 25 -29 Overweight Nutritional Risks: None Diabetes: No  How often do you need to have someone help you when you read instructions, pamphlets, or other written materials from your doctor or pharmacy?: 1 - Never What is the last grade level you completed in school?: 12th  Diabetic?no  Interpreter Needed?: No      Activities of Daily Living In your present state of health, do you have any difficulty performing the following activities: 10/26/2020  Hearing? N  Vision? N  Difficulty concentrating or making decisions? N  Walking or climbing stairs? N  Dressing or bathing? N  Doing errands, shopping? N  Preparing Food and eating ? N  Using the Toilet? N  In the past six months, have you accidently leaked urine? N  Do you have problems with loss of bowel control? N  Managing your Medications? N  Managing your Finances? N  Housekeeping or managing your Housekeeping? N  Some recent data might be hidden    Patient Care Team: Charlott Rakes, MD as PCP - General (Family  Medicine)  Indicate any recent Medical Services you may have received from other than [facility] providers in the past year (date may be approximate).     Assessment:   This is a routine wellness examination for Danielle Berger.   Dietary issues and exercise activities discussed: Current Exercise Habits: The patient does not participate in regular exercise at present, Exercise limited by: None identified   Goals Addressed             This Visit's Progress    Quit Smoking         Depression Screen [facility] 2/9  Scores 10/26/2020 04/10/2020 10/11/2019 08/10/2019 08/26/2018 05/25/2018 02/22/2018  PHQ - 2 Score 0 0 0 0 0 0 -  PHQ- 9 Score - 0 - 1 0 0 -  Exception Documentation - - - - - - Patient refusal    Fall Risk Fall Risk  10/26/2020 04/10/2020 10/11/2019 08/10/2019 01/31/2019  Falls in the past year? 0 0 0 0 0  Number falls in past yr: 0 0 - - -  Injury with Fall? 0 0 - - -  Risk for fall due to : Impaired mobility - No Fall Risks - -  Follow up Education provided - - - -    FALL RISK PREVENTION PERTAINING TO THE HOME:  Any stairs in or around the home? No  If so, are there any without handrails? No  Home free of loose throw rugs in walkways, pet beds, electrical cords, etc? Yes  Adequate lighting in your home to reduce risk of falls? Yes   ASSISTIVE DEVICES UTILIZED TO PREVENT FALLS:  Life alert? No  Use of a cane, walker or w/c? No  Grab bars in the bathroom? No  Shower chair or bench in shower? No  Elevated toilet seat or a handicapped toilet? No   TIMED UP AND GO:  Was the test performed? Yes .  Length of time to ambulate 10 feet: 3 sec.   Gait steady and fast without use of assistive device  Cognitive Function: MMSE - Mini Mental State Exam 10/26/2020 10/11/2019  Orientation to time 5 5  Orientation to Place 5 5  Registration 3 3  Attention/ Calculation 5 0  Recall 3 2  Language- name 2 objects 2 2  Language- repeat 1 1  Language- follow 3 step command 3 1  Language- read  & follow direction 1 1  Write a sentence 1 1  Copy design 1 1  Total score 30 22        Immunizations Immunization History  Administered Date(s) Administered   Influenza,inj,Quad PF,6+ Mos 02/19/2017, 12/14/2018   Influenza-Unspecified 02/04/2020   PFIZER(Purple Top)SARS-COV-2 Vaccination 07/01/2019, 07/22/2019, 02/25/2020   Pneumococcal Conjugate-13 10/11/2019   Tdap 06/10/2017   Zoster Recombinat (Shingrix) 04/10/2020, 06/11/2020    TDAP status: Up to date  Flu Vaccine status: Up to date  Pneumococcal vaccine status: Up to date  Covid-19 vaccine status: Completed vaccines  Qualifies for Shingles Vaccine? Yes   Zostavax completed Yes   Shingrix Completed?: Yes  Screening Tests Health Maintenance  Topic Date Due   COVID-19 Vaccine (4 - Booster for Pfizer series) 06/24/2020   PNA vac Low Risk Adult (2 of 2 - PPSV23) 10/10/2020   INFLUENZA VACCINE  11/12/2020   MAMMOGRAM  01/04/2022   TETANUS/TDAP  06/11/2027   COLONOSCOPY (Pts 45-40yrs Insurance coverage will need to be confirmed)  02/27/2029   DEXA SCAN  Completed   Hepatitis C Screening  Completed   Zoster Vaccines- Shingrix  Completed   HPV VACCINES  Aged Out    Health Maintenance  Health Maintenance Due  Topic Date Due   COVID-19 Vaccine (4 - Booster for [place] series) 06/24/2020   PNA vac Low Risk Adult (2 of 2 - PPSV23) 10/10/2020    Colorectal cancer screening: Type of screening: Colonoscopy. Completed 2021. Repeat every 10 years  Mammogram status: Completed 2022. Repeat every year  Bone Density status: Completed  . Results reflect: Bone density results: NORMAL. Repeat every 5 years.  Lung Cancer Screening: (Low Dose CT Chest recommended if Age 38-80 years, 64  pack-year currently smoking OR have quit w/in 15years.) does not qualify.   Lung Cancer Screening Referral: NA  Additional Screening:  Hepatitis C Screening: does qualify; Completed   Vision Screening: Recommended annual ophthalmology  exams for early detection of glaucoma and other disorders of the eye. Is the patient up to date with their annual eye exam?  No  Who is the provider or what is the name of the office in which the patient attends annual eye exams? na If pt is not established with a provider, would they like to be referred to a provider to establish care? No .   Dental Screening: Recommended annual dental exams for proper oral hygiene  Community Resource Referral / Chronic Care Management: CRR required this visit?  No   CCM required this visit?  No      Plan:     I have personally reviewed and noted the following in the patient's chart:   Medical and social history Use of alcohol, tobacco or illicit drugs  Current medications and supplements including opioid prescriptions.  Functional ability and status Nutritional status Physical activity Advanced directives List of other physicians Hospitalizations, surgeries, and ER visits in previous 12 months Vitals Screenings to include cognitive, depression, and falls Referrals and appointments  In addition, I have reviewed and discussed with patient certain preventive protocols, quality metrics, and best practice recommendations. A written personalized care plan for preventive services as well as general preventive health recommendations were provided to patient.     Fenton Foy, NP   10/26/2020

## 2020-10-26 NOTE — Patient Instructions (Addendum)
Danielle Berger , Thank you for taking time to come for your Medicare Wellness Visit. I appreciate your ongoing commitment to your health goals. Please review the following plan we discussed and let me know if I can assist you in the future.   These are the goals we discussed:  Goals      Quit Smoking        This is a list of the screening recommended for you and due dates:  Health Maintenance  Topic Date Due   COVID-19 Vaccine (4 - Booster for Pfizer series) 06/24/2020   Pneumonia vaccines (2 of 2 - PPSV23) 10/10/2020   Flu Shot  11/12/2020   Mammogram  01/04/2022   Tetanus Vaccine  06/11/2027   Colon Cancer Screening  02/27/2029   DEXA scan (bone density measurement)  Completed   Hepatitis C Screening: USPSTF Recommendation to screen - Ages 84-79 yo.  Completed   Zoster (Shingles) Vaccine  Completed   HPV Vaccine  Aged Out   Critical care medicine: Principles of diagnosis and management in the adult (4th ed., pp. [phone]). [place]unders."> Miller's anesthesia (8th ed., pp. 232-250). [place]unders.">  Advance Directive  Advance directives are legal documents that allow you to make decisions about your health care and medical treatment in case you become unable to communicate for yourself. Advance directives let your wishes be known to family, friends,and health care providers. Discussing and writing advance directives should happen over time rather than all at once. Advance directives can be changed and updated at any time. There are different types of advance directives, such as: Medical power of attorney. Living will. Do not resuscitate (DNR) order or do not attempt resuscitation (DNAR) order. Health care proxy and medical power of attorney A health care proxy is also called a health care agent. This person is appointed to make medical decisions for you when you are unable to make decisions for yourself. Generally, people ask a trusted friend or family member to act as their proxy and  represent their preferences. Make sure you have an agreement with your trusted person to act as your proxy. A proxy may have tomake a medical decision on your behalf if your wishes are not known. A medical power of attorney, also called a durable power of attorney for health care, is a legal document that names your health care proxy. Depending on the laws in your state, the document may need to be: Signed. Notarized. Dated. Copied. Witnessed. Incorporated into your medical record. You may also want to appoint a trusted person to manage your money in the event you are unable to do so. This is called a durable power of attorney for finances. It is a separate legal document from the durable power of attorney for health care. You may choose your health care proxy or someone different toact as your agent in money matters. If you do not appoint a proxy, or there is a concern that the proxy is not acting in your best interest, a court may appoint a guardian to act on yourbehalf. Living will A living will is a set of instructions that state your wishes about medical care when you cannot express them yourself. Health care providers should keep a copy of your living will in your medical record. You may want to give a copy to family members or friends. To alert caregivers in case of an emergency, you can place a card in your wallet to let them know that you have a living will and  where they can find it. A living will is used if you become: Terminally ill. Disabled. Unable to communicate or make decisions. The following decisions should be included in your living will: To use or not to use life support equipment, such as dialysis machines and breathing machines (ventilators). Whether you want a DNR or DNAR order. This tells health care providers not to use cardiopulmonary resuscitation (CPR) if breathing or heartbeat stops. To use or not to use tube feeding. To be given or not to be given food and  fluids. Whether you want comfort (palliative) care when the goal becomes comfort rather than a cure. Whether you want to donate your organs and tissues. A living will does not give instructions for distributing your money andproperty if you should pass away. DNR or DNAR A DNR or DNAR order is a request not to have CPR in the event that your heart stops beating or you stop breathing. If a DNR or DNAR order has not been made and shared, a health care provider will try to help any patient whose heart has stopped or who has stopped breathing. If you plan to have surgery, talk with your health care provider about how your DNR or DNAR order will be followed ifproblems occur. What if I do not have an advance directive? Some states assign family decision makers to act on your behalf if you do not have an advance directive. Each state has its own laws about advance directives. You may want to check with your health care provider, attorney, orstate representative about the laws in your state. Summary Advance directives are legal documents that allow you to make decisions about your health care and medical treatment in case you become unable to communicate for yourself. The process of discussing and writing advance directives should happen over time. You can change and update advance directives at any time. Advance directives may include a medical power of attorney, a living will, and a DNR or DNAR order. This information is not intended to replace advice given to you by your health care provider. Make sure you discuss any questions you have with your healthcare provider. Document Revised: 01/03/2020 Document Reviewed: 01/03/2020 Elsevier Patient Education  2022 [place] Prevention in the Home, Adult Falls can cause injuries and can happen to people of all ages. There are many things you can do to make your home safe and to help prevent falls. Ask forhelp when making these changes. What actions  can I take to prevent falls? General Instructions Use good lighting in all rooms. Replace any light bulbs that burn out. Turn on the lights in dark areas. Use night-lights. Keep items that you use often in easy-to-reach places. Lower the shelves around your home if needed. Set up your furniture so you have a clear path. Avoid moving your furniture around. Do not have throw rugs or other things on the floor that can make you trip. Avoid walking on wet floors. If any of your floors are uneven, fix them. Add color or contrast paint or tape to clearly mark and help you see: Grab bars or handrails. First and last steps of staircases. Where the edge of each step is. If you use a stepladder: Make sure that it is fully opened. Do not climb a closed stepladder. Make sure the sides of the stepladder are locked in place. Ask someone to hold the stepladder while you use it. Know where your pets are when moving through your home. What can  I do in the bathroom?     Keep the floor dry. Clean up any water on the floor right away. Remove soap buildup in the tub or shower. Use nonskid mats or decals on the floor of the tub or shower. Attach bath mats securely with double-sided, nonslip rug tape. If you need to sit down in the shower, use a plastic, nonslip stool. Install grab bars by the toilet and in the tub and shower. Do not use towel bars as grab bars. What can I do in the bedroom? Make sure that you have a light by your bed that is easy to reach. Do not use any sheets or blankets for your bed that hang to the floor. Have a firm chair with side arms that you can use for support when you get dressed. What can I do in the kitchen? Clean up any spills right away. If you need to reach something above you, use a step stool with a grab bar. Keep electrical cords out of the way. Do not use floor polish or wax that makes floors slippery. What can I do with my stairs? Do not leave any items on the  stairs. Make sure that you have a light switch at the top and the bottom of the stairs. Make sure that there are handrails on both sides of the stairs. Fix handrails that are broken or loose. Install nonslip stair treads on all your stairs. Avoid having throw rugs at the top or bottom of the stairs. Choose a carpet that does not hide the edge of the steps on the stairs. Check carpeting to make sure that it is firmly attached to the stairs. Fix carpet that is loose or worn. What can I do on the outside of my home? Use bright outdoor lighting. Fix the edges of walkways and driveways and fix any cracks. Remove anything that might make you trip as you walk through a door, such as a raised step or threshold. Trim any bushes or trees on paths to your home. Check to see if handrails are loose or broken and that both sides of all steps have handrails. Install guardrails along the edges of any raised decks and porches. Clear paths of anything that can make you trip, such as tools or rocks. Have leaves, snow, or ice cleared regularly. Use sand or salt on paths during winter. Clean up any spills in your garage right away. This includes grease or oil spills. What other actions can I take? Wear shoes that: Have a low heel. Do not wear high heels. Have rubber bottoms. Feel good on your feet and fit well. Are closed at the toe. Do not wear open-toe sandals. Use tools that help you move around if needed. These include: Canes. Walkers. Scooters. Crutches. Review your medicines with your doctor. Some medicines can make you feel dizzy. This can increase your chance of falling. Ask your doctor what else you can do to help prevent falls. Where to find more information Centers for Disease Control and Prevention, [employer]: http://www.wolf.info/ National Institute on Aging: http://kim-miller.com/ Contact a doctor if: You are afraid of falling at home. You feel weak, drowsy, or dizzy at home. You fall at  home. Summary There are many simple things that you can do to make your home safe and to help prevent falls. Ways to make your home safe include removing things that can make you trip and installing grab bars in the bathroom. Ask for help when making these changes in your  home. This information is not intended to replace advice given to you by your health care provider. Make sure you discuss any questions you have with your healthcare provider. Document Revised: 11/02/2019 Document Reviewed: 11/02/2019 Elsevier Patient Education  [place] Maintenance, Female Adopting a healthy lifestyle and getting preventive care are important in promoting health and wellness. Ask your health care provider about: The right schedule for you to have regular tests and exams. Things you can do on your own to prevent diseases and keep yourself healthy. What should I know about diet, weight, and exercise? Eat a healthy diet  Eat a diet that includes plenty of vegetables, fruits, low-fat dairy products, and lean protein. Do not eat a lot of foods that are high in solid fats, added sugars, or sodium.  Maintain a healthy weight Body mass index (BMI) is used to identify weight problems. It estimates body fat based on height and weight. Your health care provider can help determineyour BMI and help you achieve or maintain a healthy weight. Get regular exercise Get regular exercise. This is one of the most important things you can do for your health. Most adults should: Exercise for at least 150 minutes each week. The exercise should increase your heart rate and make you sweat (moderate-intensity exercise). Do strengthening exercises at least twice a week. This is in addition to the moderate-intensity exercise. Spend less time sitting. Even light physical activity can be beneficial. Watch cholesterol and blood lipids Have your blood tested for lipids and cholesterol at 66 years of age, then havethis  test every 5 years. Have your cholesterol levels checked more often if: Your lipid or cholesterol levels are high. You are older than 66 years of age. You are at high risk for heart disease. What should I know about cancer screening? Depending on your health history and family history, you may need to have cancer screening at various ages. This may include screening for: Breast cancer. Cervical cancer. Colorectal cancer. Skin cancer. Lung cancer. What should I know about heart disease, diabetes, and high blood pressure? Blood pressure and heart disease High blood pressure causes heart disease and increases the risk of stroke. This is more likely to develop in people who have high blood pressure readings, are of African descent, or are overweight. Have your blood pressure checked: Every 3-5 years if you are 75-39 years of age. Every year if you are 48 years old or older. Diabetes Have regular diabetes screenings. This checks your fasting blood sugar level. Have the screening done: Once every three years after age 69 if you are at a normal weight and have a low risk for diabetes. More often and at a younger age if you are overweight or have a high risk for diabetes. What should I know about preventing infection? Hepatitis B If you have a higher risk for hepatitis B, you should be screened for this virus. Talk with your health care provider to find out if you are at risk forhepatitis B infection. Hepatitis C Testing is recommended for: Everyone born from [birth year] through 1966. Anyone with known risk factors for hepatitis C. Sexually transmitted infections (STIs) Get screened for STIs, including gonorrhea and chlamydia, if: You are sexually active and are younger than 66 years of age. You are older than 66 years of age and your health care provider tells you that you are at risk for this type of infection. Your sexual activity has changed since you were last screened, and you  are at  increased risk for chlamydia or gonorrhea. Ask your health care provider if you are at risk. Ask your health care provider about whether you are at high risk for HIV. Your health care provider may recommend a prescription medicine to help prevent HIV infection. If you choose to take medicine to prevent HIV, you should first get tested for HIV. You should then be tested every 3 months for as long as you are taking the medicine. Pregnancy If you are about to stop having your period (premenopausal) and you may become pregnant, seek counseling before you get pregnant. Take 400 to 800 micrograms (mcg) of folic acid every day if you become pregnant. Ask for birth control (contraception) if you want to prevent pregnancy. Osteoporosis and menopause Osteoporosis is a disease in which the bones lose minerals and strength with aging. This can result in bone fractures. If you are 60 years old or older, or if you are at risk for osteoporosis and fractures, ask your health care provider if you should: Be screened for bone loss. Take a calcium or vitamin D supplement to lower your risk of fractures. Be given hormone replacement therapy (HRT) to treat symptoms of menopause. Follow these instructions at home: Lifestyle Do not use any products that contain nicotine or tobacco, such as cigarettes, e-cigarettes, and chewing tobacco. If you need help quitting, ask your health care provider. Do not use street drugs. Do not share needles. Ask your health care provider for help if you need support or information about quitting drugs. Alcohol use Do not drink alcohol if: Your health care provider tells you not to drink. You are pregnant, may be pregnant, or are planning to become pregnant. If you drink alcohol: Limit how much you use to 0-1 drink a day. Limit intake if you are breastfeeding. Be aware of how much alcohol is in your drink. In the [place] one drink equals one 12 oz bottle of beer (355 mL), one 5 oz glass  of wine (148 mL), or one 1 oz glass of hard liquor (44 mL). General instructions Schedule regular health, dental, and eye exams. Stay current with your vaccines. Tell your health care provider if: You often feel depressed. You have ever been abused or do not feel safe at home. Summary Adopting a healthy lifestyle and getting preventive care are important in promoting health and wellness. Follow your health care provider's instructions about healthy diet, exercising, and getting tested or screened for diseases. Follow your health care provider's instructions on monitoring your cholesterol and blood pressure. This information is not intended to replace advice given to you by your health care provider. Make sure you discuss any questions you have with your healthcare provider. Document Revised: 03/24/2018 Document Reviewed: 03/24/2018 Elsevier Patient Education  2022 [place].  Steps to Quit Smoking Smoking tobacco is the leading cause of preventable death. It can affect almost every organ in the body. Smoking puts you and those around you at risk for developing many serious chronic diseases. Quitting smoking can be difficult, but it is one of the best things that you can do for your health. It is nevertoo late to quit. How do I get ready to quit? When you decide to quit smoking, create a plan to help you succeed. Before you quit: Pick a date to quit. Set a date within the next 2 weeks to give you time to prepare. Write down the reasons why you are quitting. Keep this list in places where you will  see it often. Tell your family, friends, and co-workers that you are quitting. Support from your loved ones can make quitting easier. Talk with your health care provider about your options for quitting smoking. Find out what treatment options are covered by your health insurance. Identify people, places, things, and activities that make you want to smoke (triggers). Avoid them. What first steps  can I take to quit smoking? Throw away all cigarettes at home, at work, and in your car. Throw away smoking accessories, such as Scientist, research (medical). Clean your car. Make sure to empty the ashtray. Clean your home, including curtains and carpets. What strategies can I use to quit smoking? Talk with your health care provider about combining strategies, such as taking medicines while you are also receiving in-person counseling. Using these two strategies together makes you more likely to succeed in quitting than if you used either strategy on its own. If you are pregnant or breastfeeding, talk with your health care provider about finding counseling or other support strategies to quit smoking. Do not take medicine to help you quit smoking unless your health care provider tells you to do so. To quit smoking: Quit right away Quit smoking completely, instead of gradually reducing how much you smoke over a period of time. Research shows that stopping smoking right away is more successful than gradually quitting. Attend in-person counseling to help you build problem-solving skills. You are more likely to succeed in quitting if you attend counseling sessions regularly. Even short sessions of 10 minutes can be effective. Take medicine You may take medicines to help you quit smoking. Some medicines require a prescription and some you can purchase over-the-counter. Medicines may have nicotine in them to replace the nicotine in cigarettes. Medicines may: Help to stop cravings. Help to relieve withdrawal symptoms. Your health care provider may recommend: Nicotine patches, gum, or lozenges. Nicotine inhalers or sprays. Non-nicotine medicine that is taken by mouth. Find resources Find resources and support systems that can help you to quit smoking and remain smoke-free after you quit. These resources are most helpful when you use them often. They include: Online chats with a Social worker. Telephone  quitlines. Printed Furniture conservator/restorer. Support groups or group counseling. Text messaging programs. Mobile phone apps or applications. Use apps that can help you stick to your quit plan by providing reminders, tips, and encouragement. There are many free apps for mobile devices as well as websites. Examples include Quit Guide from the [employer] and smokefree.gov What things can I do to make it easier to quit?  Reach out to your family and friends for support and encouragement. Call telephone quitlines (1-800-QUIT-NOW), reach out to support groups, or work with a counselor for support. Ask people who smoke to avoid smoking around you. Avoid places that trigger you to smoke, such as bars, parties, or smoke-break areas at work. Spend time with people who do not smoke. Lessen the stress in your life. Stress can be a smoking trigger for some people. To lessen stress, try: Exercising regularly. Doing deep-breathing exercises. Doing yoga. Meditating. Performing a body scan. This involves closing your eyes, scanning your body from head to toe, and noticing which parts of your body are particularly tense. Try to relax the muscles in those areas. How will I feel when I quit smoking? Day 1 to 3 weeks Within the first 24 hours of quitting smoking, you may start to feel withdrawal symptoms. These symptoms are usually most noticeable 2-3 days after quitting, but they usually  do not last for more than 2-3 weeks. You may experience these symptoms: Mood swings. Restlessness, anxiety, or irritability. Trouble concentrating. Dizziness. Strong cravings for sugary foods and nicotine. Mild weight gain. Constipation. Nausea. Coughing or a sore throat. Changes in how the medicines that you take for unrelated issues work in your body. Depression. Trouble sleeping (insomnia). Week 3 and afterward After the first 2-3 weeks of quitting, you may start to notice more positive results, such as: Improved sense of smell  and taste. Decreased coughing and sore throat. Slower heart rate. Lower blood pressure. Clearer skin. The ability to breathe more easily. Fewer sick days. Quitting smoking can be very challenging. Do not get discouraged if you are not successful the first time. Some people need to make many attempts to quit before they achieve long-term success. Do your best to stick to your quit plan, and talk with your health care provider if you haveany questions or concerns. Summary Smoking tobacco is the leading cause of preventable death. Quitting smoking is one of the best things that you can do for your health. When you decide to quit smoking, create a plan to help you succeed. Quit smoking right away, not slowly over a period of time. When you start quitting, seek help from your health care provider, family, or friends. This information is not intended to replace advice given to you by your health care provider. Make sure you discuss any questions you have with your healthcare provider. Document Revised: 12/24/2018 Document Reviewed: 06/19/2018 Elsevier Patient Education  [place].

## 2021-01-09 ENCOUNTER — Ambulatory Visit: Payer: Medicare Other | Attending: Family Medicine | Admitting: Family Medicine

## 2021-01-09 ENCOUNTER — Other Ambulatory Visit: Payer: Self-pay

## 2021-01-09 VITALS — BP 129/73 | HR 56 | Ht 62.0 in | Wt 153.8 lb

## 2021-01-09 DIAGNOSIS — E038 Other specified hypothyroidism: Secondary | ICD-10-CM | POA: Diagnosis not present

## 2021-01-09 DIAGNOSIS — I10 Essential (primary) hypertension: Secondary | ICD-10-CM | POA: Diagnosis not present

## 2021-01-09 DIAGNOSIS — E782 Mixed hyperlipidemia: Secondary | ICD-10-CM

## 2021-01-09 DIAGNOSIS — Z23 Encounter for immunization: Secondary | ICD-10-CM | POA: Diagnosis not present

## 2021-01-09 NOTE — Progress Notes (Signed)
Subjective:  Patient ID: Danielle Berger, female    DOB: 04/07/1956  Age: 65 y.o. MRN: [id number]  CC: Hypertension   HPI Danielle Berger is a 65 y.o. year old female with a history of tobacco abuse, hypertension, hyperlipidemia, hypothyroidism who comes into the clinic for a follow-up visit.    Interval History: Her hip is a lot better compared to her last office visit 3 months ago. She is doing well on her antihypertensive and tolerating her statin okay with no adverse effects from her medications. For hypothyroidism she is on levothyroxine. She continues to smoke and has a difficulty quitting as her husband also smokes as well.  Quit in the past but just restarted when the pandemic hit. She has no additional concerns today.  Past Medical History:  Diagnosis Date   Chest pain 07/17/2015   Chronic renal disease    AS A CHILD   Esophageal reflux    PT. DENIES 02/15/19   Headache    HLD (hyperlipidemia)    Hypertension    Hypothyroidism    Tobacco abuse 07/17/2015    Past Surgical History:  Procedure Laterality Date   Brain Tumor removed      when patient was in the 6th grade   KIDNEY SURGERY     when patient was int he 8th grade    Family History  Adopted: Yes    Allergies  Allergen Reactions   Codeine Nausea And Vomiting   Penicillins Rash    Outpatient Medications Prior to Visit  Medication Sig Dispense Refill   atorvastatin (LIPITOR) 80 MG tablet Take 1 tablet (80 mg total) by mouth daily. 90 tablet 3   carvedilol (COREG) 12.5 MG tablet TAKE 1 TABLET BY MOUTH  TWICE DAILY WITH A MEAL 180 tablet 3   levothyroxine (SYNTHROID) 88 MCG tablet Take 1 tablet (88 mcg total) by mouth daily before breakfast. 90 tablet 1   lisinopril-hydrochlorothiazide (ZESTORETIC) 10-12.5 MG tablet Take 1 tablet by mouth daily. 90 tablet 3   Omega-3 Fatty Acids (FISH OIL PO) Take by mouth.     aspirin EC 81 MG EC tablet Take 1 tablet (81 mg total) by mouth daily.  (Patient not taking: Reported on 01/09/2021)     cyclobenzaprine (FLEXERIL) 10 MG tablet Take 1 tablet (10 mg total) by mouth 2 (two) times daily as needed for muscle spasms. (Patient not taking: Reported on 01/09/2021) 60 tablet 6   diclofenac Sodium (VOLTAREN) 1 % GEL Apply 2 g topically 4 (four) times daily. (Patient not taking: Reported on 01/09/2021) 100 g 1   gabapentin (NEURONTIN) 300 MG capsule Take 1 capsule (300 mg total) by mouth 2 (two) times daily. (Patient not taking: Reported on 01/09/2021) 180 capsule 3   hydrOXYzine (ATARAX/VISTARIL) 25 MG tablet Take 1 tablet (25 mg total) by mouth 3 (three) times daily as needed for anxiety. (Patient not taking: Reported on 01/09/2021) 60 tablet 1   lidocaine (LIDODERM) 5 % Place 1 patch onto the skin daily. Remove & Discard patch within 12 hours or as directed by MD (Patient not taking: Reported on 01/09/2021) 30 patch 0   No facility-administered medications prior to visit.     ROS Review of Systems  Constitutional:  Negative for activity change, appetite change and fatigue.  HENT:  Negative for congestion, sinus pressure and sore throat.   Eyes:  Negative for visual disturbance.  Respiratory:  Negative for cough, chest tightness, shortness of breath and wheezing.   Cardiovascular:  Negative  for chest pain and palpitations.  Gastrointestinal:  Negative for abdominal distention, abdominal pain and constipation.  Endocrine: Negative for polydipsia.  Genitourinary:  Negative for dysuria and frequency.  Musculoskeletal:  Negative for arthralgias and back pain.  Skin:  Negative for rash.  Neurological:  Negative for tremors, light-headedness and numbness.  Hematological:  Does not bruise/bleed easily.  Psychiatric/Behavioral:  Negative for agitation and behavioral problems.    Objective:  BP (!) 147/79   Pulse (!) 56   Ht '5\' 2"'  (1.575 m)   Wt 153 lb 12.8 oz (69.8 kg)   SpO2 99%   BMI 28.13 kg/m   BP/Weight 01/09/2021 10/26/2020 27/51/7001   Systolic BP 749 449 675  Diastolic BP 79 64 68  Wt. (Lbs) 153.8 154 155  BMI 28.13 28.17 28.35      Physical Exam Constitutional:      Appearance: She is well-developed.  Cardiovascular:     Rate and Rhythm: Bradycardia present.     Heart sounds: Normal heart sounds. No murmur heard. Pulmonary:     Effort: Pulmonary effort is normal.     Breath sounds: Normal breath sounds. No wheezing or rales.  Chest:     Chest wall: No tenderness.  Abdominal:     General: Bowel sounds are normal. There is no distension.     Palpations: Abdomen is soft. There is no mass.     Tenderness: There is no abdominal tenderness.  Musculoskeletal:        General: Normal range of motion.     Right lower leg: No edema.     Left lower leg: No edema.  Neurological:     Mental Status: She is alert and oriented to person, place, and time.  Psychiatric:        Mood and Affect: Mood normal.    CMP Latest Ref Rng & Units 04/10/2020 08/10/2019 12/14/2018  Glucose 65 - 99 mg/dL 102(H) 85 103(H)  BUN 8 - 27 mg/dL 22 23 29(H)  Creatinine 0.57 - 1.00 mg/dL 1.43(H) 1.69(H) 1.87(H)  Sodium 134 - 144 mmol/L 140 139 137  Potassium 3.5 - 5.2 mmol/L 4.7 4.5 5.0  Chloride 96 - 106 mmol/L 101 100 99  CO2 20 - 29 mmol/L '21 23 24  ' Calcium 8.7 - 10.3 mg/dL 9.5 9.6 9.2  Total Protein 6.0 - 8.5 g/dL 6.8 7.0 6.9  Total Bilirubin 0.0 - 1.2 mg/dL 0.3 0.4 0.3  Alkaline Phos 44 - 121 IU/L 97 105 95  AST 0 - 40 IU/L '29 22 23  ' ALT 0 - 32 IU/L '29 16 20    ' Lipid Panel     Component Value Date/Time   CHOL 180 08/10/2019 1424   TRIG 230 (H) 08/10/2019 1424   HDL 48 08/10/2019 1424   CHOLHDL 3.8 08/10/2019 1424   CHOLHDL 3.7 05/07/2016 0946   VLDL 34 (H) 05/07/2016 0946   LDLCALC 93 08/10/2019 1424    CBC    Component Value Date/Time   WBC 6.9 10/11/2019 1208   WBC 7.6 07/17/2015 1011   RBC 4.16 10/11/2019 1208   RBC 5.08 07/17/2015 1011   HGB 12.9 10/11/2019 1208   HCT 38.0 10/11/2019 1208   PLT 300 10/11/2019  1208   MCV 91 10/11/2019 1208   MCH 31.0 10/11/2019 1208   MCH 28.3 07/17/2015 1011   MCHC 33.9 10/11/2019 1208   MCHC 32.0 07/17/2015 1011   RDW 12.8 10/11/2019 1208   LYMPHSABS 1.9 10/11/2019 1208   MONOABS 0.5 07/17/2015 1011  EOSABS 0.2 10/11/2019 1208   BASOSABS 0.1 10/11/2019 1208    Lab Results  Component Value Date   HGBA1C 5.8 (H) 12/14/2018    Assessment & Plan:  1. Essential hypertension Controlled Continue carvedilol Counseled on blood pressure goal of less than 130/80, low-sodium, DASH diet, medication compliance, 150 minutes of moderate intensity exercise per week. Discussed medication compliance, adverse effects. - CMP14+EGFR  2. Mixed hyperlipidemia Total cholesterol and LDL were normal from last labs but slightly elevated triglycerides Currently on atorvastatin 80 mg We will check lipid panel again Low-cholesterol diet - Lipid panel  3. Other specified hypothyroidism Controlled, last TSH was normal Will send of thyroid panel and adjust regimen accordingly - T4, free - TSH  4. Need for immunization against influenza - Flu Vaccine QUAD 42moIM (Fluarix, Fluzone & Alfiuria Quad PF)   No orders of the defined types were placed in this encounter.   Return in about 6 months (around 07/09/2021) for medical conditions.       ECharlott Rakes MD, FAAFP. [facility]  01/09/2021, 8:41 AM

## 2021-01-09 NOTE — Patient Instructions (Addendum)
Tumeric capsules help with inflammation

## 2021-01-10 ENCOUNTER — Encounter: Payer: Self-pay | Admitting: Family Medicine

## 2021-01-10 LAB — LIPID PANEL
Chol/HDL Ratio: 4.8 ratio — ABNORMAL HIGH (ref 0.0–4.4)
Cholesterol, Total: 206 mg/dL — ABNORMAL HIGH (ref 100–199)
HDL: 43 mg/dL (ref >39)
LDL Chol Calc (NIH): 122 mg/dL — ABNORMAL HIGH (ref 0–99)
Triglycerides: 232 mg/dL — ABNORMAL HIGH (ref 0–149)
VLDL Cholesterol Cal: 41 mg/dL — ABNORMAL HIGH (ref 5–40)

## 2021-01-10 LAB — CMP14+EGFR
ALT: 20 IU/L (ref 0–32)
AST: 23 IU/L (ref 0–40)
Albumin/Globulin Ratio: 2.3 — ABNORMAL HIGH (ref 1.2–2.2)
Albumin: 4.8 g/dL (ref 3.8–4.8)
Alkaline Phosphatase: 102 IU/L (ref 44–121)
BUN/Creatinine Ratio: 17 (ref 12–28)
BUN: 25 mg/dL (ref 8–27)
Bilirubin Total: 0.3 mg/dL (ref 0.0–1.2)
CO2: 25 mmol/L (ref 20–29)
Calcium: 9.8 mg/dL (ref 8.7–10.3)
Chloride: 99 mmol/L (ref 96–106)
Creatinine, Ser: 1.47 mg/dL — ABNORMAL HIGH (ref 0.57–1.00)
Globulin, Total: 2.1 g/dL (ref 1.5–4.5)
Glucose: 104 mg/dL — ABNORMAL HIGH (ref 70–99)
Potassium: 5.5 mmol/L — ABNORMAL HIGH (ref 3.5–5.2)
Sodium: 139 mmol/L (ref 134–144)
Total Protein: 6.9 g/dL (ref 6.0–8.5)
eGFR: 39 mL/min/{1.73_m2} — ABNORMAL LOW (ref >59)

## 2021-01-10 LAB — TSH: TSH: 0.573 u[IU]/mL (ref 0.450–4.500)

## 2021-01-10 LAB — T4, FREE: Free T4: 1.83 ng/dL — ABNORMAL HIGH (ref 0.82–1.77)

## 2021-01-10 MED ORDER — LEVOTHYROXINE SODIUM 88 MCG PO TABS: 88.0000 ug | ORAL_TABLET | Freq: Every day | ORAL | 1 refills | Status: DC

## 2021-01-11 ENCOUNTER — Telehealth: Payer: Self-pay

## 2021-01-11 NOTE — Telephone Encounter (Signed)
Pt was called and a VM was left informing patient of lab results. 

## 2021-01-11 NOTE — Telephone Encounter (Signed)
Pt called to report that she listened to the VM that was left, no longer needs a call back

## 2021-01-11 NOTE — Telephone Encounter (Signed)
Pt calling in to return call. She is requesting to have a call back. Attempted to reach office with no response. Please advise.

## 2021-01-11 NOTE — Telephone Encounter (Signed)
-----   Message from Charlott Rakes, MD sent at 01/10/2021 10:07 AM EDT ----- Please inform her that her cholesterol is elevated compared to 1 year ago.  Advised to work on a low-cholesterol diet, exercise as if cholesterol remains elevated we will have to change treatment.  Thyroid labs are normal, kidney function is slightly abnormal but has improved compared to previous labs.

## 2021-01-11 NOTE — Telephone Encounter (Signed)
Noted  

## 2021-01-22 ENCOUNTER — Telehealth: Payer: Self-pay | Admitting: Family Medicine

## 2021-01-22 NOTE — Telephone Encounter (Signed)
FYI

## 2021-01-22 NOTE — Telephone Encounter (Signed)
Nurse Tillie Rung with [facility] called while making a house call on this patient stating her A1C was 6.1  and she also stated that she heard bruits in her carotid art.  CB#  [phone]

## 2021-05-29 ENCOUNTER — Other Ambulatory Visit: Payer: Self-pay | Admitting: Family Medicine

## 2021-05-29 DIAGNOSIS — E038 Other specified hypothyroidism: Secondary | ICD-10-CM

## 2021-05-29 NOTE — Telephone Encounter (Signed)
Requested Prescriptions  Pending Prescriptions Disp Refills   levothyroxine (SYNTHROID) 88 MCG tablet [Pharmacy Med Name: Levothyroxine Sodium 88 MCG Oral Tablet] 90 tablet 1    Sig: TAKE 1 TABLET BY MOUTH  DAILY BEFORE BREAKFAST     Endocrinology:  Hypothyroid Agents Passed - 05/29/2021  6:48 AM      Passed - TSH in normal range and within 360 days    TSH  Date Value Ref Range Status  01/09/2021 0.573 0.450 - 4.500 uIU/mL Final         Passed - Valid encounter within last 12 months    Recent Outpatient Visits          4 months ago Essential hypertension   [place], [place], MD   7 months ago Hip pain   [place], Enobong, MD   11 months ago Need for zoster vaccination   [place], RPH-CPP   1 year ago Other specified hypothyroidism   [place], [place]obong, MD   1 year ago Tobacco abuse   [place], MD      Future Appointments            In 1 month Charlott Rakes, MD [place]

## 2021-07-09 ENCOUNTER — Ambulatory Visit
Admission: RE | Admit: 2021-07-09 | Discharge: 2021-07-09 | Disposition: A | Discharge status: Home / Self Care | Payer: Medicare Other | Source: Ambulatory Visit | Attending: Family Medicine | Admitting: Family Medicine

## 2021-07-09 ENCOUNTER — Other Ambulatory Visit: Payer: Self-pay

## 2021-07-09 ENCOUNTER — Encounter: Payer: Self-pay | Admitting: Family Medicine

## 2021-07-09 ENCOUNTER — Ambulatory Visit: Payer: Medicare Other | Attending: Family Medicine | Admitting: Family Medicine

## 2021-07-09 VITALS — BP 138/65 | HR 54 | Resp 16 | Wt 155.6 lb

## 2021-07-09 DIAGNOSIS — E782 Mixed hyperlipidemia: Secondary | ICD-10-CM | POA: Diagnosis not present

## 2021-07-09 DIAGNOSIS — I10 Essential (primary) hypertension: Secondary | ICD-10-CM

## 2021-07-09 DIAGNOSIS — Z23 Encounter for immunization: Secondary | ICD-10-CM | POA: Diagnosis not present

## 2021-07-09 DIAGNOSIS — M25559 Pain in unspecified hip: Secondary | ICD-10-CM | POA: Diagnosis not present

## 2021-07-09 DIAGNOSIS — R5383 Other fatigue: Secondary | ICD-10-CM | POA: Diagnosis not present

## 2021-07-09 DIAGNOSIS — E038 Other specified hypothyroidism: Secondary | ICD-10-CM

## 2021-07-09 MED ORDER — MELOXICAM 7.5 MG PO TABS: 7.5000 mg | ORAL_TABLET | Freq: Every day | ORAL | 1 refills | Status: AC

## 2021-07-09 MED ORDER — GABAPENTIN 300 MG PO CAPS: 300.0000 mg | ORAL_CAPSULE | Freq: Two times a day (BID) | ORAL | 3 refills | Status: DC

## 2021-07-09 MED ORDER — CARVEDILOL 12.5 MG PO TABS: ORAL_TABLET | ORAL | 3 refills | Status: DC

## 2021-07-09 MED ORDER — LIDOCAINE 5 % EX PTCH: 1.0000 | MEDICATED_PATCH | CUTANEOUS | 3 refills | Status: AC

## 2021-07-09 MED ORDER — CYCLOBENZAPRINE HCL 10 MG PO TABS: 10.0000 mg | ORAL_TABLET | Freq: Two times a day (BID) | ORAL | 6 refills | Status: DC | PRN

## 2021-07-09 MED ORDER — LISINOPRIL-HYDROCHLOROTHIAZIDE 10-12.5 MG PO TABS: 1.0000 | ORAL_TABLET | Freq: Every day | ORAL | 3 refills | Status: DC

## 2021-07-09 NOTE — Progress Notes (Signed)
? ?Subjective:  ?Patient ID: Danielle Berger, female    DOB: 1956/02/22  Age: 66 y.o. MRN: [id number] ? ?CC: Hypertension, Fatigue (/), and Hip Pain (Left Danielle Berger goes down leg/Pain on and off/Pain scale 4 out of 10) ? ? ?HPI ?Danielle Berger is a 66 y.o. year old female with a history of tobacco abuse, hypertension, hyperlipidemia, hypothyroidism who comes into the clinic for a follow-up visit. ?  ? ?Interval History: ?She complains of left hip pain which has been present for a couple of months.  At her last visit she had stated symptoms were better.  Pain radiates down her left leg and is rated as a 4/10.  She does have gabapentin and Flexeril which she takes intermittently.  Denies presence of falls while having to use a cane and she has no numbness. ? ? ?Also has fatigue which is present all the time. 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  Fatigue has been present for the last couple of months. ?Taking vitamin B12 and one a day women which she thinks has provided her little energy. ? ?For her hypertension she has been compliant with her antihypertensive.  Initial blood pressure was elevated at greater than 176 systolic but repeat performed by myself manually returned at 138/65. ?Also compliant with her statin and is doing well on levothyroxine. ? ?Past Medical History:  ?Diagnosis Date  ? Chest pain 07/17/2015  ? Chronic renal disease   ? AS A CHILD  ? Esophageal reflux   ? PT. DENIES 02/15/19  ? Headache   ? HLD (hyperlipidemia)   ? Hypertension   ? Hypothyroidism   ? Tobacco abuse 07/17/2015  ? ? ?Past Surgical History:  ?Procedure Laterality Date  ? Brain Tumor removed     ? when patient was in the 6th grade  ? KIDNEY SURGERY    ? when patient was int he 8th grade  ? ? ?Family History  ?Adopted: Yes  ? ? ?Social History  ? ?Socioeconomic History  ? Marital status: Married  ?  Spouse name: Not on file  ? Number of children: 1  ? Years of education:  Not on file  ? Highest education level: 12th grade  ?Occupational History  ? Not on file  ?Tobacco Use  ? Smoking status: Every Day  ?  Packs/day: 0.50  ?  Types: Cigarettes  ? Smokeless tobacco: Never  ?Vaping Use  ? Vaping Use: Never used  ?Substance and Sexual Activity  ? Alcohol use: Yes  ?  Alcohol/week: 2.0 standard drinks  ?  Types: 1 Glasses of wine, 1 Shots of liquor per week  ?  Comment: occ  ? Drug use: No  ? Sexual activity: Yes  ?  Birth control/protection: None  ?Other Topics Concern  ? Not on file  ?Social History Narrative  ? Not on file  ? ?Social Determinants of Health  ? ?Financial Resource Strain: Low Risk   ? Difficulty of Paying Living Expenses: Not hard at all  ?Food Insecurity: No Food Insecurity  ? Worried About Charity fundraiser in the Last Year: Never true  ? Ran Out of Food in the Last Year: Never true  ?Transportation Needs: No Transportation Needs  ? Lack of Transportation (Medical): No  ? Lack of Transportation (Non-Medical): No  ?Physical Activity: Inactive  ? Days of Exercise per Week: 0 days  ? Minutes of Exercise per Session:  0 min  ?Stress: No Stress Concern Present  ? Feeling of Stress : Not at all  ?Social Connections: Socially Integrated  ? Frequency of Communication with Friends and Family: More than three times a week  ? Frequency of Social Gatherings with Friends and Family: Once a week  ? Attends Religious Services: More than 4 times per year  ? Active Member of Clubs or Organizations: Yes  ? Attends Archivist Meetings: More than 4 times per year  ? Marital Status: Married  ? ? ?Allergies  ?Allergen Reactions  ? Codeine Nausea And Vomiting  ? Penicillins Rash  ? ? ?Outpatient Medications Prior to Visit  ?Medication Sig Dispense Refill  ? aspirin EC 81 MG EC tablet Take 1 tablet (81 mg total) by mouth daily. (Patient not taking: Reported on 01/09/2021)    ? atorvastatin (LIPITOR) 80 MG tablet Take 1 tablet (80 mg total) by mouth daily. 90 tablet 3  ? hydrOXYzine  (ATARAX/VISTARIL) 25 MG tablet Take 1 tablet (25 mg total) by mouth 3 (three) times daily as needed for anxiety. (Patient not taking: Reported on 01/09/2021) 60 tablet 1  ? levothyroxine (SYNTHROID) 88 MCG tablet TAKE 1 TABLET BY MOUTH  DAILY BEFORE BREAKFAST 90 tablet 1  ? Omega-3 Fatty Acids (FISH OIL PO) Take by mouth.    ? carvedilol (COREG) 12.5 MG tablet TAKE 1 TABLET BY MOUTH  TWICE DAILY WITH A MEAL 180 tablet 3  ? cyclobenzaprine (FLEXERIL) 10 MG tablet Take 1 tablet (10 mg total) by mouth 2 (two) times daily as needed for muscle spasms. (Patient not taking: Reported on 01/09/2021) 60 tablet 6  ? diclofenac Sodium (VOLTAREN) 1 % GEL Apply 2 g topically 4 (four) times daily. (Patient not taking: Reported on 01/09/2021) 100 g 1  ? gabapentin (NEURONTIN) 300 MG capsule Take 1 capsule (300 mg total) by mouth 2 (two) times daily. (Patient not taking: Reported on 01/09/2021) 180 capsule 3  ? lidocaine (LIDODERM) 5 % Place 1 patch onto the skin daily. Remove & Discard patch within 12 hours or as directed by MD (Patient not taking: Reported on 01/09/2021) 30 patch 0  ? lisinopril-hydrochlorothiazide (ZESTORETIC) 10-12.5 MG tablet Take 1 tablet by mouth daily. 90 tablet 3  ? ?No facility-administered medications prior to visit.  ? ? ? ?ROS ?Review of Systems  ?Constitutional:  Positive for fatigue. Negative for activity change and appetite change.  ?HENT:  Negative for congestion, sinus pressure and sore throat.   ?Eyes:  Negative for visual disturbance.  ?Respiratory:  Negative for cough, chest tightness, shortness of breath and wheezing.   ?Cardiovascular:  Negative for chest pain and palpitations.  ?Gastrointestinal:  Negative for abdominal distention, abdominal pain and constipation.  ?Endocrine: Negative for polydipsia.  ?Genitourinary:  Negative for dysuria and frequency.  ?Musculoskeletal:   ?     See HPI  ?Skin:  Negative for rash.  ?Neurological:  Negative for tremors, light-headedness and numbness.   ?Hematological:  Does not bruise/bleed easily.  ?Psychiatric/Behavioral:  Negative for agitation and behavioral problems.   ? ?Objective:  ?BP 138/65   Pulse (!) 54   Resp 16   Wt 155 lb 9.6 oz (70.6 kg)   SpO2 99%   BMI 28.46 kg/m?  ? ? ?  07/09/2021  ?  8:38 AM 07/09/2021  ?  8:37 AM 01/09/2021  ?  8:38 AM  ?BP/Weight  ?Systolic BP 782 956 213  ?Diastolic BP 65 78 73  ?Wt. (Lbs)  155.6 153.8  ?  BMI  28.46 kg/m2 28.13 kg/m2  ? ? ? ? ?Physical Exam ?Constitutional:   ?   Appearance: She is well-developed.  ?Cardiovascular:  ?   Rate and Rhythm: Bradycardia present.  ?   Heart sounds: Normal heart sounds. No murmur heard. ?Pulmonary:  ?   Effort: Pulmonary effort is normal.  ?   Breath sounds: Normal breath sounds. No wheezing or rales.  ?Chest:  ?   Chest wall: No tenderness.  ?Abdominal:  ?   General: Bowel sounds are normal. There is no distension.  ?   Palpations: Abdomen is soft. There is no mass.  ?   Tenderness: There is no abdominal tenderness.  ?Musculoskeletal:  ?   Right lower leg: No edema.  ?   Left lower leg: No edema.  ?   Comments: Tenderness on lateral aspect of left hip on abduction, abduction, flexion and extension of left lower extremity. ?Negative straight leg raise  ?Neurological:  ?   Mental Status: She is alert and oriented to person, place, and time.  ?Psychiatric:     ?   Mood and Affect: Mood normal.  ? ? ? ?  Latest Ref Rng & Units 01/09/2021  ?  9:06 AM 04/10/2020  ? 10:24 AM 08/10/2019  ?  2:24 PM  ?CMP  ?Glucose 70 - 99 mg/dL 104   102   85    ?BUN 8 - 27 mg/dL '25   22   23    '$ ?Creatinine 0.57 - 1.00 mg/dL 1.47   1.43   1.69    ?Sodium 134 - 144 mmol/L 139   140   139    ?Potassium 3.5 - 5.2 mmol/L 5.5   4.7   4.5    ?Chloride 96 - 106 mmol/L 99   101   100    ?CO2 20 - 29 mmol/L '25   21   23    '$ ?Calcium 8.7 - 10.3 mg/dL 9.8   9.5   9.6    ?Total Protein 6.0 - 8.5 g/dL 6.9   6.8   7.0    ?Total Bilirubin 0.0 - 1.2 mg/dL 0.3   0.3   0.4    ?Alkaline Phos 44 - 121 IU/L 102   97   105     ?AST 0 - 40 IU/L '23   29   22    '$ ?ALT 0 - 32 IU/L '20   29   16    '$ ? ? ?Lipid Panel  ?   ?Component Value Date/Time  ? CHOL 206 (H) 01/09/2021 0906  ? TRIG 232 (H) 01/09/2021 0906  ? HDL 43 01/09/2021 0906  ? CHOLHDL 4.8

## 2021-07-09 NOTE — Patient Instructions (Signed)
Hip Pain The hip is the joint between the upper legs and the lower pelvis. The bones, cartilage, tendons, and muscles of your hip joint support your body and allow you to move around. Hip pain can range from a minor ache to severe pain in one or both of your hips. The pain may be felt on the inside of the hip joint near the groin, or on the outside near the buttocks and upper thigh. You may also have swelling or stiffness in your hip area. Follow these instructions at home: Managing pain, stiffness, and swelling   If directed, put ice on the painful area. To do this: Put ice in a plastic bag. Place a towel between your skin and the bag. Leave the ice on for 20 minutes, 2-3 times a day. If directed, apply heat to the affected area as often as told by your health care provider. Use the heat source that your health care provider recommends, such as a moist heat pack or a heating pad. Place a towel between your skin and the heat source. Leave the heat on for 20-30 minutes. Remove the heat if your skin turns bright red. This is especially important if you are unable to feel pain, heat, or cold. You may have a greater risk of getting burned. Activity Do exercises as told by your health care provider. Avoid activities that cause pain. General instructions  Take over-the-counter and prescription medicines only as told by your health care provider. Keep a journal of your symptoms. Write down: How often you have hip pain. The location of your pain. What the pain feels like. What makes the pain worse. Sleep with a pillow between your legs on your most comfortable side. Keep all follow-up visits as told by your health care provider. This is important. Contact a health care provider if: You cannot put weight on your leg. Your pain or swelling continues or gets worse after one week. It gets harder to walk. You have a fever. Get help right away if: You fall. You have a sudden increase in pain and  swelling in your hip. Your hip is red or swollen or very tender to touch. Summary Hip pain can range from a minor ache to severe pain in one or both of your hips. The pain may be felt on the inside of the hip joint near the groin, or on the outside near the buttocks and upper thigh. Avoid activities that cause pain. Write down how often you have hip pain, the location of the pain, what makes it worse, and what it feels like. This information is not intended to replace advice given to you by your health care provider. Make sure you discuss any questions you have with your health care provider. Document Revised: 08/16/2018 Document Reviewed: 08/16/2018 Elsevier Patient Education  2022 [employer]vier Inc.  

## 2021-07-10 ENCOUNTER — Other Ambulatory Visit: Payer: Self-pay | Admitting: Family Medicine

## 2021-07-10 DIAGNOSIS — N1832 Chronic kidney disease, stage 3b: Secondary | ICD-10-CM

## 2021-07-10 DIAGNOSIS — M25559 Pain in unspecified hip: Secondary | ICD-10-CM

## 2021-07-10 LAB — CMP14+EGFR
ALT: 19 IU/L (ref 0–32)
AST: 22 IU/L (ref 0–40)
Albumin/Globulin Ratio: 2.3 — ABNORMAL HIGH (ref 1.2–2.2)
Albumin: 4.6 g/dL (ref 3.8–4.8)
Alkaline Phosphatase: 99 IU/L (ref 44–121)
BUN/Creatinine Ratio: 15 (ref 12–28)
BUN: 22 mg/dL (ref 8–27)
Bilirubin Total: 0.3 mg/dL (ref 0.0–1.2)
CO2: 26 mmol/L (ref 20–29)
Calcium: 9.6 mg/dL (ref 8.7–10.3)
Chloride: 101 mmol/L (ref 96–106)
Creatinine, Ser: 1.46 mg/dL — ABNORMAL HIGH (ref 0.57–1.00)
Globulin, Total: 2 g/dL (ref 1.5–4.5)
Glucose: 110 mg/dL — ABNORMAL HIGH (ref 70–99)
Potassium: 4.8 mmol/L (ref 3.5–5.2)
Sodium: 143 mmol/L (ref 134–144)
Total Protein: 6.6 g/dL (ref 6.0–8.5)
eGFR: 39 mL/min/{1.73_m2} — ABNORMAL LOW (ref >59)

## 2021-07-10 LAB — CBC WITH DIFFERENTIAL/PLATELET
Basophils Absolute: 0 10*3/uL (ref 0.0–0.2)
Basos: 1 %
EOS (ABSOLUTE): 0 10*3/uL (ref 0.0–0.4)
Eos: 1 %
Hematocrit: 39.6 % (ref 34.0–46.6)
Hemoglobin: 12.8 g/dL (ref 11.1–15.9)
Immature Grans (Abs): 0 10*3/uL (ref 0.0–0.1)
Immature Granulocytes: 0 %
Lymphocytes Absolute: 1.6 10*3/uL (ref 0.7–3.1)
Lymphs: 30 %
MCH: 29.4 pg (ref 26.6–33.0)
MCHC: 32.3 g/dL (ref 31.5–35.7)
MCV: 91 fL (ref 79–97)
Monocytes Absolute: 0.5 10*3/uL (ref 0.1–0.9)
Monocytes: 10 %
Neutrophils Absolute: 3 10*3/uL (ref 1.4–7.0)
Neutrophils: 58 %
Platelets: 249 10*3/uL (ref 150–450)
RBC: 4.36 x10E6/uL (ref 3.77–5.28)
RDW: 12.6 % (ref 11.7–15.4)
WBC: 5.1 10*3/uL (ref 3.4–10.8)

## 2021-07-10 LAB — LP+NON-HDL CHOLESTEROL
Cholesterol, Total: 193 mg/dL (ref 100–199)
HDL: 48 mg/dL (ref >39)
LDL Chol Calc (NIH): 117 mg/dL — ABNORMAL HIGH (ref 0–99)
Total Non-HDL-Chol (LDL+VLDL): 145 mg/dL — ABNORMAL HIGH (ref 0–129)
Triglycerides: 159 mg/dL — ABNORMAL HIGH (ref 0–149)
VLDL Cholesterol Cal: 28 mg/dL (ref 5–40)

## 2021-07-10 LAB — TSH: TSH: 1.45 u[IU]/mL (ref 0.450–4.500)

## 2021-07-10 LAB — VITAMIN D 25 HYDROXY (VIT D DEFICIENCY, FRACTURES): Vit D, 25-Hydroxy: 38.3 ng/mL (ref 30.0–100.0)

## 2021-07-10 LAB — T4, FREE: Free T4: 1.59 ng/dL (ref 0.82–1.77)

## 2021-07-16 ENCOUNTER — Telehealth: Payer: Self-pay | Admitting: Family Medicine

## 2021-07-16 NOTE — Telephone Encounter (Signed)
Copied from [place] [id number]. Topic: Referral - Status >> Jul 16, 2021  9:33 AM Alanda Slim E wrote: Reason for CRM: Pt called about kidney referral and was advised referral was sent to [facility] but the status shows "do not schedule" please update pt on referral asap

## 2021-07-25 ENCOUNTER — Encounter: Payer: Self-pay | Admitting: Orthopaedic Surgery

## 2021-07-25 ENCOUNTER — Ambulatory Visit (INDEPENDENT_AMBULATORY_CARE_PROVIDER_SITE_OTHER): Payer: Medicare Other | Admitting: Orthopaedic Surgery

## 2021-07-25 DIAGNOSIS — M7062 Trochanteric bursitis, left hip: Secondary | ICD-10-CM | POA: Diagnosis not present

## 2021-07-25 NOTE — Progress Notes (Signed)
The patient is referred to me due to the left hip pain that is been hurting for over a year now with no known injury.  She denies any groin pain.  She says actually right now today she is feeling fine and not really having pain.  It has been off and on.  She has occasional back pain.  She is never injured the hip.  She is a side sleeper.  It does hurt when she lays on that side.  She does not do any extensive exercises either.  She has had no acute medical issues.  ? ?She walks with a normal-appearing gait.  Her leg lengths are equal.  She has full and fluid range of motion of both hips with no pain in the groin at all.  She is essentially pain-free on range of motion even on the lateral aspect of both hips.  She does have pain to palpation over the trochanteric area and the proximal IT band of her left hip. ? ?An AP pelvis on the canopy system was reviewed and shows normal-appearing hips with congruent and well-maintained joint spaces. ? ?Her signs and symptoms are consistent with trochanteric bursitis and IT band syndrome.  I showed her stretching exercises to try and I recommended Voltaren gel.  I did offer her a steroid injection and she has deferred this for now hoping that the stretching and Voltaren gel will help.  I agree with this treatment plan.  If this does not help, she knows to call us and our next step would be considering steroid injection and physical therapy. ?

## 2021-08-08 ENCOUNTER — Other Ambulatory Visit: Payer: Self-pay | Admitting: Family Medicine

## 2021-08-08 DIAGNOSIS — E782 Mixed hyperlipidemia: Secondary | ICD-10-CM

## 2021-08-08 NOTE — Telephone Encounter (Signed)
Medication Refill - Medication:  ?atorvastatin (LIPITOR) 80 MG tablet  ? ?Has the patient contacted their pharmacy? Yes.   ?Pharmacy reached out to pt first  ? ?Preferred Pharmacy (with phone number or street name):  ?[facility] ([place], [address]  ?[address], [place] [address]  ?Phone:  [phone]  Fax:  [phone]  ? ?Has the patient been seen for an appointment in the last year OR does the patient have an upcoming appointment? Yes ? ?Agent: Please be advised that RX refills may take up to 3 business days. We ask that you follow-up with your pharmacy. ?

## 2021-08-09 MED ORDER — ATORVASTATIN CALCIUM 80 MG PO TABS: 80.0000 mg | ORAL_TABLET | Freq: Every day | ORAL | 0 refills | Status: DC

## 2021-08-09 NOTE — Telephone Encounter (Signed)
Requested Prescriptions  ?Pending Prescriptions Disp Refills  ?? atorvastatin (LIPITOR) 80 MG tablet 90 tablet 0  ?  Sig: Take 1 tablet (80 mg total) by mouth daily.  ?  ? Cardiovascular:  Antilipid - Statins Failed - 08/08/2021 11:09 AM  ?  ?  Failed - Lipid Panel in normal range within the last 12 months  ?  Cholesterol, Total  ?Date Value Ref Range Status  ?07/09/2021 193 100 - 199 mg/dL Final  ? ?LDL Chol Calc (NIH)  ?Date Value Ref Range Status  ?07/09/2021 117 (H) 0 - 99 mg/dL Final  ? ?HDL  ?Date Value Ref Range Status  ?07/09/2021 48 >39 mg/dL Final  ? ?Triglycerides  ?Date Value Ref Range Status  ?07/09/2021 159 (H) 0 - 149 mg/dL Final  ? ?  ?  ?  Passed - Patient is not pregnant  ?  ?  Passed - Valid encounter within last 12 months  ?  Recent Outpatient Visits   ?      ? 1 month ago Hip pain  ? [place], Charlane Ferretti, MD  ? 7 months ago Essential hypertension  ? [place], Charlane Ferretti, MD  ? 10 months ago Hip pain  ? [place] Charlott Rakes, MD  ? 1 year ago Need for zoster vaccination  ? [place], RPH-CPP  ? 1 year ago Other specified hypothyroidism  ? [place] Charlott Rakes, MD  ?  ?  ? ?  ?  ?  ? ? ?

## 2021-08-21 ENCOUNTER — Other Ambulatory Visit: Payer: Self-pay | Admitting: Family Medicine

## 2021-08-21 DIAGNOSIS — E038 Other specified hypothyroidism: Secondary | ICD-10-CM

## 2021-09-13 ENCOUNTER — Other Ambulatory Visit: Payer: Self-pay | Admitting: Nephrology

## 2021-09-13 DIAGNOSIS — R3589 Other polyuria: Secondary | ICD-10-CM

## 2021-09-13 DIAGNOSIS — E785 Hyperlipidemia, unspecified: Secondary | ICD-10-CM

## 2021-09-13 DIAGNOSIS — N2581 Secondary hyperparathyroidism of renal origin: Secondary | ICD-10-CM

## 2021-09-13 DIAGNOSIS — E039 Hypothyroidism, unspecified: Secondary | ICD-10-CM

## 2021-09-13 DIAGNOSIS — N1832 Chronic kidney disease, stage 3b: Secondary | ICD-10-CM

## 2021-09-17 ENCOUNTER — Ambulatory Visit
Admission: RE | Admit: 2021-09-17 | Discharge: 2021-09-17 | Disposition: A | Discharge status: Home / Self Care | Payer: Medicare Other | Source: Ambulatory Visit | Attending: Nephrology | Admitting: Nephrology

## 2021-09-17 DIAGNOSIS — N2581 Secondary hyperparathyroidism of renal origin: Secondary | ICD-10-CM

## 2021-09-17 DIAGNOSIS — E039 Hypothyroidism, unspecified: Secondary | ICD-10-CM

## 2021-09-17 DIAGNOSIS — N1832 Chronic kidney disease, stage 3b: Secondary | ICD-10-CM

## 2021-09-17 DIAGNOSIS — E785 Hyperlipidemia, unspecified: Secondary | ICD-10-CM

## 2021-09-17 DIAGNOSIS — R3589 Other polyuria: Secondary | ICD-10-CM

## 2021-10-17 ENCOUNTER — Other Ambulatory Visit: Payer: Self-pay | Admitting: Family Medicine

## 2021-10-17 DIAGNOSIS — E782 Mixed hyperlipidemia: Secondary | ICD-10-CM

## 2021-10-17 MED ORDER — ATORVASTATIN CALCIUM 80 MG PO TABS: 80.0000 mg | ORAL_TABLET | Freq: Every day | ORAL | 0 refills | Status: DC

## 2021-10-17 NOTE — Telephone Encounter (Signed)
Medication Refill - Medication: atorvastatin (LIPITOR) 80 MG tabletat  Has the patient contacted their pharmacy? Yes.    (Agent: If yes, when and what did the pharmacy advise?)  Preferred Pharmacy (with phone number or street name):  [facility] ([place]) [place], [place] Phone:  [phone]  Fax:  [phone]      Has the patient been seen for an appointment in the last year OR does the patient have an upcoming appointment? Yes.    Agent: Please be advised that RX refills may take up to 3 business days. We ask that you follow-up with your pharmacy.

## 2021-10-17 NOTE — Telephone Encounter (Signed)
Requested medications are due for refill today.  yes  Requested medications are on the active medications list.  yes  Last refill. 08/09/2021  Future visit scheduled.   no  Notes to clinic.  Pharmacy is requesting a 1 year supply.    Requested Prescriptions  Pending Prescriptions Disp Refills   atorvastatin (LIPITOR) 80 MG tablet 90 tablet 0    Sig: Take 1 tablet (80 mg total) by mouth daily.     Cardiovascular:  Antilipid - Statins Failed - 10/17/2021  9:56 AM      Failed - Lipid Panel in normal range within the last 12 months    Cholesterol, Total  Date Value Ref Range Status  07/09/2021 193 100 - 199 mg/dL Final   LDL Chol Calc (NIH)  Date Value Ref Range Status  07/09/2021 117 (H) 0 - 99 mg/dL Final   HDL  Date Value Ref Range Status  07/09/2021 48 >39 mg/dL Final   Triglycerides  Date Value Ref Range Status  07/09/2021 159 (H) 0 - 149 mg/dL Final         Passed - Patient is not pregnant      Passed - Valid encounter within last 12 months    Recent Outpatient Visits           3 months ago Hip pain   [place], Charlane Ferretti, MD   9 months ago Essential hypertension   [place], Enobong, MD   1 year ago Hip pain   [place], [place]obong, MD   1 year ago Need for zoster vaccination   [place], Jarome Matin, RPH-CPP   1 year ago Other specified hypothyroidism    [facility] Charlott Rakes, MD

## 2021-12-03 ENCOUNTER — Ambulatory Visit
Admission: EM | Admit: 2021-12-03 | Discharge: 2021-12-03 | Disposition: A | Discharge status: Home / Self Care | Payer: Medicare Other | Attending: Internal Medicine | Admitting: Internal Medicine

## 2021-12-03 ENCOUNTER — Encounter: Payer: Self-pay | Admitting: Emergency Medicine

## 2021-12-03 DIAGNOSIS — Z20822 Contact with and (suspected) exposure to covid-19: Secondary | ICD-10-CM | POA: Diagnosis not present

## 2021-12-03 DIAGNOSIS — R059 Cough, unspecified: Secondary | ICD-10-CM | POA: Insufficient documentation

## 2021-12-03 DIAGNOSIS — J069 Acute upper respiratory infection, unspecified: Secondary | ICD-10-CM | POA: Diagnosis not present

## 2021-12-03 DIAGNOSIS — J029 Acute pharyngitis, unspecified: Secondary | ICD-10-CM | POA: Diagnosis not present

## 2021-12-03 LAB — SARS CORONAVIRUS 2 BY RT PCR: SARS Coronavirus 2 by RT PCR: NEGATIVE

## 2021-12-03 LAB — POCT RAPID STREP A (OFFICE): Rapid Strep A Screen: NEGATIVE

## 2021-12-03 MED ORDER — CHLORASEPTIC 1.4 % MT LIQD: 1.0000 | OROMUCOSAL | 0 refills | Status: DC | PRN

## 2021-12-03 MED ORDER — BENZONATATE 100 MG PO CAPS: 100.0000 mg | ORAL_CAPSULE | Freq: Three times a day (TID) | ORAL | 0 refills | Status: DC | PRN

## 2021-12-03 MED ORDER — FLUTICASONE PROPIONATE 50 MCG/ACT NA SUSP: 1.0000 | Freq: Every day | NASAL | 0 refills | Status: DC

## 2021-12-03 NOTE — ED Provider Notes (Signed)
[facility]    CSN: [id number] Arrival date & time: 12/03/21  7425      History   Chief Complaint Chief Complaint  Patient presents with   Sore Throat    HPI Cicley Ganesh is a 66 y.o. female.   Patient presents with runny nose, mild, sore throat.  Patient reports that sore throat is most bothersome symptom.  Symptoms started yesterday.  Denies any known sick contacts or fever.  Denies chest pain, shortness of breath, ear pain, nausea, vomiting, diarrhea, abdominal pain.   Sore Throat    Past Medical History:  Diagnosis Date   Chest pain 07/17/2015   Chronic renal disease    AS A CHILD   Esophageal reflux    PT. 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   Essential hypertension    HLD (hyperlipidemia)    Hypothyroidism    Esophageal reflux    Pain in the chest    Chronic renal disease    Hypertension 07/17/2015   Hypertensive urgency 07/17/2015   Chest pain 07/17/2015   Tobacco abuse 07/17/2015    Past Surgical History:  Procedure Laterality Date   Brain Tumor removed      when patient was in the 6th grade   KIDNEY SURGERY     when patient was int he 8th grade    OB History     Gravida  1   Para      Term      Preterm      AB      Living  1      SAB      IAB      Ectopic      Multiple      Live Births  1            Home Medications    Prior to Admission medications   Medication Sig Start Date End Date Taking? Authorizing Provider  benzonatate (TESSALON) 100 MG capsule Take 1 capsule (100 mg total) by mouth every 8 (eight) hours as needed for cough. 12/03/21  Yes Zahi Plaskett, Hildred Alamin E, FNP  fluticasone (FLONASE) 50 MCG/ACT nasal spray Place 1 spray into both nostrils daily for 3 days.  12/03/21 12/06/21 Yes Helaman Mecca, Hildred Alamin E, FNP  phenol (CHLORASEPTIC) 1.4 % LIQD Use as directed 1 spray in the mouth or throat as needed for throat irritation / pain. 12/03/21  Yes Teodora Medici, FNP  aspirin EC 81 MG EC tablet Take 1 tablet (81 mg total) by mouth daily. Patient not taking: Reported on 01/09/2021 07/18/15   Barton Dubois, MD  atorvastatin (LIPITOR) 80 MG tablet Take 1 tablet (80 mg total) by mouth daily. 10/17/21   Charlott Rakes, MD  carvedilol (COREG) 12.5 MG tablet TAKE 1 TABLET BY MOUTH  TWICE DAILY WITH A MEAL 07/09/21   Charlott Rakes, MD  cyclobenzaprine (FLEXERIL) 10 MG tablet Take 1 tablet (10 mg total) by mouth 2 (two) times daily as needed for muscle spasms. 07/09/21   Charlott Rakes, MD  gabapentin (NEURONTIN) 300 MG capsule Take 1 capsule (300 mg total) by mouth 2 (two) times daily. 07/09/21   Charlott Rakes, MD  hydrOXYzine (ATARAX/VISTARIL) 25 MG tablet Take 1 tablet (25 mg total)  by mouth 3 (three) times daily as needed for anxiety. Patient not taking: Reported on 01/09/2021 04/10/20   Charlott Rakes, MD  levothyroxine (SYNTHROID) 88 MCG tablet TAKE 1 TABLET BY MOUTH DAILY  BEFORE BREAKFAST 08/21/21   Charlott Rakes, MD  lidocaine (LIDODERM) 5 % Place 1 patch onto the skin daily. Remove & Discard patch within 12 hours or as directed by MD 07/09/21   Charlott Rakes, MD  lisinopril-hydrochlorothiazide (ZESTORETIC) 10-12.5 MG tablet Take 1 tablet by mouth daily. 07/09/21   Charlott Rakes, MD  meloxicam (MOBIC) 7.5 MG tablet Take 1 tablet (7.5 mg total) by mouth daily. 07/09/21   Charlott Rakes, MD  Omega-3 Fatty Acids (FISH OIL PO) Take by mouth.    [provider]    Family History Family History  Adopted: Yes    Social History Social History   Tobacco Use   Smoking status: Every Day    Packs/day: 0.50    Types: Cigarettes   Smokeless tobacco: Never  Vaping Use   Vaping Use: Never used  Substance Use Topics   Alcohol use: Yes    Alcohol/week: 2.0  standard drinks of alcohol    Types: 1 Glasses of wine, 1 Shots of liquor per week    Comment: occ   Drug use: No     Allergies   Codeine and Penicillins   Review of Systems Review of Systems Per HPI  Physical Exam Triage Vital Signs ED Triage Vitals  Enc Vitals Group     BP 12/03/21 0949 (!) 142/71     Pulse Rate 12/03/21 0948 63     Resp 12/03/21 0948 17     Temp 12/03/21 0948 98.2 F (36.8 C)     Temp src --      SpO2 12/03/21 0948 97 %     Weight --      Height --      Head Circumference --      Peak Flow --      Pain Score 12/03/21 0949 6     Pain Loc --      Pain Edu? --      Excl. in [place]? --    No data found.  Updated Vital Signs BP (!) 142/71   Pulse 63   Temp 98.2 F (36.8 C)   Resp 17   SpO2 97%   Visual Acuity Right Eye Distance:   Left Eye Distance:   Bilateral Distance:    Right Eye Near:   Left Eye Near:    Bilateral Near:     Physical Exam Constitutional:      General: She is not in acute distress.    Appearance: Normal appearance. She is not toxic-appearing or diaphoretic.  HENT:     Head: Normocephalic and atraumatic.     Right Ear: Tympanic membrane and ear canal normal.     Left Ear: Tympanic membrane and ear canal normal.     Nose: Congestion present.     Mouth/Throat:     Mouth: Mucous membranes are moist.     Pharynx: Posterior oropharyngeal erythema present. No pharyngeal swelling, oropharyngeal exudate or uvula swelling.  Eyes:     Extraocular Movements: Extraocular movements intact.     Conjunctiva/sclera: Conjunctivae normal.     Pupils: Pupils are equal, round, and reactive to light.  Cardiovascular:     Rate and Rhythm: Normal rate and regular rhythm.     Pulses: Normal pulses.     Heart sounds: Normal heart sounds.  Pulmonary:     Effort: Pulmonary effort is normal. No respiratory distress.     Breath sounds: Normal breath sounds. No stridor. No wheezing, rhonchi or rales.  Abdominal:     General: Abdomen is  flat. Bowel sounds are normal.     Palpations: Abdomen is soft.  Musculoskeletal:        General: Normal range of motion.     Cervical back: Normal range of motion.  Skin:    General: Skin is warm and dry.  Neurological:     General: No focal deficit present.     Mental Status: She is alert and oriented to person, place, and time. Mental status is at baseline.  Psychiatric:        Mood and Affect: Mood normal.        Behavior: Behavior normal.      UC Treatments / Results  Labs (all labs ordered are listed, but only abnormal results are displayed) Labs Reviewed  CULTURE, GROUP A STREP ([place])  SARS CORONAVIRUS 2 BY RT PCR  POCT RAPID STREP A (OFFICE)    EKG   Radiology No results found.  Procedures Procedures (including critical care time)  Medications Ordered in UC Medications - No data to display  Initial Impression / Assessment and Plan / UC Course  I have reviewed the triage vital signs and the nursing notes.  Pertinent labs & imaging results that were available during my care of the patient were reviewed by me and considered in my medical decision making (see chart for details).     Patient presents with symptoms likely from a viral upper respiratory infection. Differential includes bacterial pneumonia, sinusitis, allergic rhinitis, COVID-19, flu. Do not suspect underlying cardiopulmonary process. Symptoms seem unlikely related to ACS, CHF or COPD exacerbations, pneumonia, pneumothorax. Patient is nontoxic appearing and not in need of emergent medical intervention.  Strep is negative.  Throat culture and COVID test pending.  Recommended symptom control with over the counter medications.  Patient sent prescriptions.  Return if symptoms fail to improve in 1-2 weeks or you develop shortness of breath, chest pain, severe headache. Patient states understanding and is agreeable.  Discharged with PCP followup.  Final Clinical Impressions(s) / UC Diagnoses   Final  diagnoses:  Viral upper respiratory tract infection with cough  Sore throat     Discharge Instructions      It appears that you have a viral illness as we discussed.  Your rapid strep was negative.  Throat culture and COVID test are pending.  We will call if it is positive.  You have been prescribed 3 medications to help alleviate symptoms.  Please follow-up if symptoms persist or worsen.    ED Prescriptions     Medication Sig Dispense Auth. Provider   fluticasone (FLONASE) 50 MCG/ACT nasal spray Place 1 spray into both nostrils daily for 3 days. 16 g Florina Glas, Hildred Alamin E, [place]   phenol (CHLORASEPTIC) 1.4 % LIQD Use as directed 1 spray in the mouth or throat as needed for throat irritation / pain. 118 mL Ruthetta Koopmann, [place] E, [place]   benzonatate (TESSALON) 100 MG capsule Take 1 capsule (100 mg total) by mouth every 8 (eight) hours as needed for cough. 21 capsule [place], Michele Rockers, [place]      PDMP not reviewed this encounter.   Teodora Medici, [place] 12/03/21 1039

## 2021-12-03 NOTE — Discharge Instructions (Signed)
It appears that you have a viral illness as we discussed.  Your rapid strep was negative.  Throat culture and COVID test are pending.  We will call if it is positive.  You have been prescribed 3 medications to help alleviate symptoms.  Please follow-up if symptoms persist or worsen.

## 2021-12-03 NOTE — ED Triage Notes (Signed)
Pt is present today with c/o sore throat. Pt sx started x2 days ago. Denies any other sx

## 2021-12-05 LAB — CULTURE, GROUP A STREP (THRC)

## 2021-12-17 ENCOUNTER — Telehealth: Payer: Self-pay | Admitting: Family Medicine

## 2021-12-17 NOTE — Telephone Encounter (Signed)
Pt states the last OV that she had told Dr Margarita Rana she no longer needed Trazodone 50 mg, pt states is having trouble sleeping again and wants a refill, pt made an appt and took 1st available but not till Nov. Would like med called in to  [facility] ([place] [place] [facility] [facility]  [address] [place] [address] [place] [address]  Phone: [phone] Fax: [phone]  Hours: Not open 24 hour

## 2021-12-18 MED ORDER — TRAZODONE HCL 50 MG PO TABS: 50.0000 mg | ORAL_TABLET | Freq: Every evening | ORAL | 1 refills | Status: DC | PRN

## 2021-12-18 NOTE — Telephone Encounter (Signed)
Rx sent to Pharmacy

## 2021-12-18 NOTE — Telephone Encounter (Signed)
Pt has been notified.

## 2021-12-18 NOTE — Telephone Encounter (Signed)
Routing to PCP for review.

## 2021-12-25 ENCOUNTER — Other Ambulatory Visit: Payer: Self-pay | Admitting: Family Medicine

## 2021-12-25 DIAGNOSIS — E782 Mixed hyperlipidemia: Secondary | ICD-10-CM

## 2022-01-23 ENCOUNTER — Other Ambulatory Visit: Payer: Self-pay | Admitting: Family Medicine

## 2022-01-23 DIAGNOSIS — E038 Other specified hypothyroidism: Secondary | ICD-10-CM

## 2022-02-25 ENCOUNTER — Ambulatory Visit: Payer: Medicare Other | Admitting: Family Medicine

## 2022-02-28 ENCOUNTER — Encounter: Payer: Self-pay | Admitting: Family Medicine

## 2022-02-28 ENCOUNTER — Ambulatory Visit: Payer: Medicare Other | Attending: Family Medicine | Admitting: Family Medicine

## 2022-02-28 VITALS — BP 121/74 | HR 63 | Temp 97.9°F | Ht 62.0 in | Wt 159.2 lb

## 2022-02-28 DIAGNOSIS — I10 Essential (primary) hypertension: Secondary | ICD-10-CM

## 2022-02-28 DIAGNOSIS — Z1231 Encounter for screening mammogram for malignant neoplasm of breast: Secondary | ICD-10-CM | POA: Diagnosis not present

## 2022-02-28 DIAGNOSIS — M25559 Pain in unspecified hip: Secondary | ICD-10-CM

## 2022-02-28 DIAGNOSIS — R7303 Prediabetes: Secondary | ICD-10-CM

## 2022-02-28 DIAGNOSIS — E038 Other specified hypothyroidism: Secondary | ICD-10-CM | POA: Diagnosis not present

## 2022-02-28 DIAGNOSIS — E782 Mixed hyperlipidemia: Secondary | ICD-10-CM

## 2022-02-28 MED ORDER — TRAZODONE HCL 50 MG PO TABS: 50.0000 mg | ORAL_TABLET | Freq: Every evening | ORAL | 3 refills | Status: DC | PRN

## 2022-02-28 MED ORDER — ATORVASTATIN CALCIUM 80 MG PO TABS: 80.0000 mg | ORAL_TABLET | Freq: Every day | ORAL | 3 refills | Status: DC

## 2022-02-28 MED ORDER — GABAPENTIN 300 MG PO CAPS: 300.0000 mg | ORAL_CAPSULE | Freq: Two times a day (BID) | ORAL | 3 refills | Status: DC

## 2022-02-28 MED ORDER — LISINOPRIL-HYDROCHLOROTHIAZIDE 10-12.5 MG PO TABS: 1.0000 | ORAL_TABLET | Freq: Every day | ORAL | 3 refills | Status: DC

## 2022-02-28 MED ORDER — CARVEDILOL 12.5 MG PO TABS: ORAL_TABLET | ORAL | 3 refills | Status: DC

## 2022-02-28 NOTE — Progress Notes (Signed)
Subjective:  Patient ID: Danielle Berger, female    DOB: 1955-10-27  Age: 66 y.o. MRN: [id number]  CC: Hypertension   HPI Danielle Berger is a 66 y.o. year old female with a history of  tobacco abuse, hypertension, hyperlipidemia, hypothyroidism, insomnia who comes into the clinic for a follow-up visit.   Interval History: Endorses adherence with her antihypertensive and her statin.  Blood pressure is elevated today despite taking her antihypertensive and was also slightly elevated at her last visit.  Repeat blood pressure returned at 121/74. She is doing well on levothyroxine. She states she no longer needs hydroxyzine for anxiety, no longer needs meloxicam or the muscle relaxant.  She is on trazodone for insomnia. Does not exercise regularly but plans to join a gym.  Smoking 3 Cigarettes/ day and she is not ready to quit as her husband smokes as well and this is difficult for her. Past Medical History:  Diagnosis Date   Chest pain 07/17/2015   Chronic renal disease    AS A CHILD   Esophageal reflux    PT. DENIES 02/15/19   Headache    HLD (hyperlipidemia)    Hypertension    Hypothyroidism    Tobacco abuse 07/17/2015    Past Surgical History:  Procedure Laterality Date   Brain Tumor removed      when patient was in the 6th grade   KIDNEY SURGERY     when patient was int he 8th grade    Family History  Adopted: Yes    Social History   Socioeconomic History   Marital status: Married    Spouse name: Not on file   Number of children: 1   Years of education: Not on file   Highest education level: 12th grade  Occupational History   Not on file  Tobacco Use   Smoking status: Every Day    Packs/day: 0.50    Types: Cigarettes   Smokeless tobacco: Never  Vaping Use   Vaping Use: Never used  Substance and Sexual Activity   Alcohol use: Yes    Alcohol/week: 2.0 standard drinks of alcohol    Types: 1 Glasses of wine, 1 Shots of liquor per week     Comment: occ   Drug use: No   Sexual activity: Yes    Birth control/protection: None  Other Topics Concern   Not on file  Social History Narrative   Not on file   Social Determinants of Health   Financial Resource Strain: Low Risk  (10/26/2020)   Overall Financial Resource Strain (CARDIA)    Difficulty of Paying Living Expenses: Not hard at all  Food Insecurity: No Food Insecurity (10/26/2020)   Hunger Vital Sign    Worried About Running Out of Food in the Last Year: Never true    Ran Out of Food in the Last Year: Never true  Transportation Needs: No Transportation Needs (10/26/2020)   PRAPARE - Hydrologist (Medical): No    Lack of Transportation (Non-Medical): No  Physical Activity: Inactive (10/26/2020)   Exercise Vital Sign    Days of Exercise per Week: 0 days    Minutes of Exercise per Session: 0 min  Stress: No Stress Concern Present (10/26/2020)   [place]    Feeling of Stress : Not at all  Social Connections: [place] (10/26/2020)   Social Connection and Isolation Panel [NHANES]    Frequency of Communication with  Friends and Family: More than three times a week    Frequency of Social Gatherings with Friends and Family: Once a week    Attends Religious Services: More than 4 times per year    Active Member of [employer] or Organizations: Yes    Attends Music therapist: More than 4 times per year    Marital Status: Married    Allergies  Allergen Reactions   Codeine Nausea And Vomiting   Penicillins Rash    Outpatient Medications Prior to Visit  Medication Sig Dispense Refill   levothyroxine (SYNTHROID) 88 MCG tablet TAKE 1 TABLET BY MOUTH DAILY  BEFORE BREAKFAST 100 tablet 0   lidocaine (LIDODERM) 5 % Place 1 patch onto the skin daily. Remove & Discard patch within 12 hours or as directed by MD 30 patch 3   meloxicam (MOBIC) 7.5 MG tablet Take 1 tablet  (7.5 mg total) by mouth daily. 30 tablet 1   Omega-3 Fatty Acids (FISH OIL PO) Take by mouth.     phenol (CHLORASEPTIC) 1.4 % LIQD Use as directed 1 spray in the mouth or throat as needed for throat irritation / pain. 118 mL 0   atorvastatin (LIPITOR) 80 MG tablet TAKE 1 TABLET BY MOUTH DAILY 100 tablet 0   carvedilol (COREG) 12.5 MG tablet TAKE 1 TABLET BY MOUTH  TWICE DAILY WITH A MEAL 180 tablet 3   cyclobenzaprine (FLEXERIL) 10 MG tablet Take 1 tablet (10 mg total) by mouth 2 (two) times daily as needed for muscle spasms. 60 tablet 6   gabapentin (NEURONTIN) 300 MG capsule Take 1 capsule (300 mg total) by mouth 2 (two) times daily. 180 capsule 3   lisinopril-hydrochlorothiazide (ZESTORETIC) 10-12.5 MG tablet Take 1 tablet by mouth daily. 90 tablet 3   traZODone (DESYREL) 50 MG tablet Take 1 tablet (50 mg total) by mouth at bedtime as needed for sleep. 90 tablet 1   aspirin EC 81 MG EC tablet Take 1 tablet (81 mg total) by mouth daily. (Patient not taking: Reported on 01/09/2021)     benzonatate (TESSALON) 100 MG capsule Take 1 capsule (100 mg total) by mouth every 8 (eight) hours as needed for cough. (Patient not taking: Reported on 02/28/2022) 21 capsule 0   fluticasone (FLONASE) 50 MCG/ACT nasal spray Place 1 spray into both nostrils daily for 3 days. 16 g 0   hydrOXYzine (ATARAX/VISTARIL) 25 MG tablet Take 1 tablet (25 mg total) by mouth 3 (three) times daily as needed for anxiety. (Patient not taking: Reported on 01/09/2021) 60 tablet 1   No facility-administered medications prior to visit.     ROS Review of Systems  Constitutional:  Negative for activity change and appetite change.  HENT:  Negative for sinus pressure and sore throat.   Respiratory:  Negative for chest tightness, shortness of breath and wheezing.   Cardiovascular:  Negative for chest pain and palpitations.  Gastrointestinal:  Negative for abdominal distention, abdominal pain and constipation.  Genitourinary: Negative.    Musculoskeletal: Negative.   Psychiatric/Behavioral:  Negative for behavioral problems and dysphoric mood.     Objective:  BP 121/74   Pulse 63   Temp 97.9 F (36.6 C) (Oral)   Ht _0  (1.575 m)   Wt 159 lb 3.2 oz (72.2 kg)   SpO2 98%   BMI 29.12 kg/m      02/28/2022    9:21 AM 02/28/2022    8:41 AM 12/03/2021    9:49 AM  BP/Weight  Systolic BP  878 676 720  Diastolic BP 74 84 71  Wt. (Lbs)  159.2   BMI  29.12 kg/m2       Physical Exam Constitutional:      Appearance: She is well-developed.  Cardiovascular:     Rate and Rhythm: Normal rate.     Heart sounds: Normal heart sounds. No murmur heard. Pulmonary:     Effort: Pulmonary effort is normal.     Breath sounds: Normal breath sounds. No wheezing or rales.  Chest:     Chest wall: No tenderness.  Abdominal:     General: Bowel sounds are normal. There is no distension.     Palpations: Abdomen is soft. There is no mass.     Tenderness: There is no abdominal tenderness.  Musculoskeletal:        General: Normal range of motion.     Right lower leg: No edema.     Left lower leg: No edema.  Neurological:     Mental Status: She is alert and oriented to person, place, and time.  Psychiatric:        Mood and Affect: Mood normal.        Latest Ref Rng & Units 07/09/2021    9:19 AM 01/09/2021    9:06 AM 04/10/2020   10:24 AM  CMP  Glucose 70 - 99 mg/dL 110  104  102   BUN 8 - 27 mg/dL _0 Creatinine 0.57 - 1.00 mg/dL 1.46  1.47  1.43   Sodium 134 - 144 mmol/L 143  139  140   Potassium 3.5 - 5.2 mmol/L 4.8  5.5  4.7   Chloride 96 - 106 mmol/L 101  99  101   CO2 20 - 29 mmol/L _1 Calcium 8.7 - 10.3 mg/dL 9.6  9.8  9.5   Total Protein 6.0 - 8.5 g/dL 6.6  6.9  6.8   Total Bilirubin 0.0 - 1.2 mg/dL 0.3  0.3  0.3   Alkaline Phos 44 - 121 IU/L 99  102  97   AST 0 - 40 IU/L _2 ALT 0 - 32 IU/L _3 Lipid Panel     Component Value Date/Time   CHOL 193 07/09/2021 0919    TRIG 159 (H) 07/09/2021 0919   HDL 48 07/09/2021 0919   CHOLHDL 4.8 (H) 01/09/2021 0906   CHOLHDL 3.7 05/07/2016 0946   VLDL 34 (H) 05/07/2016 0946   LDLCALC 117 (H) 07/09/2021 0919    CBC    Component Value Date/Time   WBC 5.1 07/09/2021 0919   WBC 7.6 07/17/2015 1011   RBC 4.36 07/09/2021 0919   RBC 5.08 07/17/2015 1011   HGB 12.8 07/09/2021 0919   HCT 39.6 07/09/2021 0919   PLT 249 07/09/2021 0919   MCV 91 07/09/2021 0919   MCH 29.4 07/09/2021 0919   MCH 28.3 07/17/2015 1011   MCHC 32.3 07/09/2021 0919   MCHC 32.0 07/17/2015 1011   RDW 12.6 07/09/2021 0919   LYMPHSABS 1.6 07/09/2021 0919   MONOABS 0.5 07/17/2015 1011   EOSABS 0.0 07/09/2021 0919   BASOSABS 0.0 07/09/2021 0919    Lab Results  Component Value Date   HGBA1C 5.8 (H) 12/14/2018    Assessment & Plan:  1. Essential hypertension Controlled Continue current regimen Counseled on blood pressure goal of less than 130/80, low-sodium, DASH diet, medication compliance, 150  minutes of moderate intensity exercise per week. Discussed medication compliance, adverse effects. - CMP14+EGFR - LP+Non-HDL Cholesterol - carvedilol (COREG) 12.5 MG tablet; TAKE 1 TABLET BY MOUTH  TWICE DAILY WITH A MEAL  Dispense: 180 tablet; Refill: 3 - lisinopril-hydrochlorothiazide (ZESTORETIC) 10-12.5 MG tablet; Take 1 tablet by mouth daily.  Dispense: 90 tablet; Refill: 3  2. Encounter for screening mammogram for malignant neoplasm of breast - MM 3D SCREEN BREAST BILATERAL; Future  3. Prediabetes Labs reveal prediabetes with an A1c of 5.8.  Working on a low carbohydrate diet, exercise, weight loss is recommended in order to prevent progression to type 2 diabetes mellitus.  - Hemoglobin A1c  4. Other specified hypothyroidism Controlled We will check thyroid labs and adjust regimen accordingly - T4, free - TSH - T3  5. Mixed hyperlipidemia LDL is slightly above goal We will check lipid panel Low-cholesterol diet -  atorvastatin (LIPITOR) 80 MG tablet; Take 1 tablet (80 mg total) by mouth daily.  Dispense: 90 tablet; Refill: 3  6. Hip pain Pain is absent at the moment but controlled on gabapentin - gabapentin (NEURONTIN) 300 MG capsule; Take 1 capsule (300 mg total) by mouth 2 (two) times daily.  Dispense: 180 capsule; Refill: 3    Meds ordered this encounter  Medications   atorvastatin (LIPITOR) 80 MG tablet    Sig: Take 1 tablet (80 mg total) by mouth daily.    Dispense:  90 tablet    Refill:  3   carvedilol (COREG) 12.5 MG tablet    Sig: TAKE 1 TABLET BY MOUTH  TWICE DAILY WITH A MEAL    Dispense:  180 tablet    Refill:  3   gabapentin (NEURONTIN) 300 MG capsule    Sig: Take 1 capsule (300 mg total) by mouth 2 (two) times daily.    Dispense:  180 capsule    Refill:  3   traZODone (DESYREL) 50 MG tablet    Sig: Take 1 tablet (50 mg total) by mouth at bedtime as needed for sleep.    Dispense:  90 tablet    Refill:  3   lisinopril-hydrochlorothiazide (ZESTORETIC) 10-12.5 MG tablet    Sig: Take 1 tablet by mouth daily.    Dispense:  90 tablet    Refill:  3    Requesting 1 year supply    Follow-up: Return in about 6 months (around 08/29/2022) for Chronic medical conditions.       Charlott Rakes, MD, FAAFP. [facility] [place], [place]   02/28/2022, 2:22 PM

## 2022-02-28 NOTE — Patient Instructions (Signed)

## 2022-03-01 LAB — CMP14+EGFR
ALT: 19 [IU]/L (ref 0–32)
AST: 20 [IU]/L (ref 0–40)
Albumin/Globulin Ratio: 2.1 (ref 1.2–2.2)
Albumin: 4.6 g/dL (ref 3.9–4.9)
Alkaline Phosphatase: 104 [IU]/L (ref 44–121)
BUN/Creatinine Ratio: 16 (ref 12–28)
BUN: 23 mg/dL (ref 8–27)
Bilirubin Total: 0.3 mg/dL (ref 0.0–1.2)
CO2: 26 mmol/L (ref 20–29)
Calcium: 9.6 mg/dL (ref 8.7–10.3)
Chloride: 102 mmol/L (ref 96–106)
Creatinine, Ser: 1.41 mg/dL — ABNORMAL HIGH (ref 0.57–1.00)
Globulin, Total: 2.2 g/dL (ref 1.5–4.5)
Glucose: 109 mg/dL — ABNORMAL HIGH (ref 70–99)
Potassium: 4.7 mmol/L (ref 3.5–5.2)
Sodium: 143 mmol/L (ref 134–144)
Total Protein: 6.8 g/dL (ref 6.0–8.5)
eGFR: 41 mL/min/{1.73_m2} — ABNORMAL LOW

## 2022-03-01 LAB — HEMOGLOBIN A1C
Est. average glucose Bld gHb Est-mCnc: 128 mg/dL
Hgb A1c MFr Bld: 6.1 % — ABNORMAL HIGH (ref 4.8–5.6)

## 2022-03-01 LAB — T4, FREE: Free T4: 1.66 ng/dL (ref 0.82–1.77)

## 2022-03-01 LAB — T3: T3, Total: 84 ng/dL (ref 71–180)

## 2022-03-01 LAB — TSH: TSH: 1.28 u[IU]/mL (ref 0.450–4.500)

## 2022-03-01 LAB — LP+NON-HDL CHOLESTEROL
Cholesterol, Total: 189 mg/dL (ref 100–199)
HDL: 46 mg/dL
LDL Chol Calc (NIH): 116 mg/dL — ABNORMAL HIGH (ref 0–99)
Total Non-HDL-Chol (LDL+VLDL): 143 mg/dL — ABNORMAL HIGH (ref 0–129)
Triglycerides: 152 mg/dL — ABNORMAL HIGH (ref 0–149)
VLDL Cholesterol Cal: 27 mg/dL (ref 5–40)

## 2022-03-03 ENCOUNTER — Other Ambulatory Visit: Payer: Self-pay | Admitting: Family Medicine

## 2022-03-03 DIAGNOSIS — E038 Other specified hypothyroidism: Secondary | ICD-10-CM

## 2022-03-03 MED ORDER — LEVOTHYROXINE SODIUM 88 MCG PO TABS: 88.0000 ug | ORAL_TABLET | Freq: Every day | ORAL | 1 refills | Status: DC

## 2022-03-04 ENCOUNTER — Telehealth: Payer: Self-pay

## 2022-03-04 NOTE — Telephone Encounter (Signed)
-----   Message from Charlott Rakes, MD sent at 03/03/2022 12:56 PM EST ----- Labs reveal prediabetes with an A1c of 6.1.  Working on a low carbohydrate diet, exercise, weight loss is recommended in order to prevent progression to type 2 diabetes mellitus.  Cholesterol is stable compared to previous labs.  Kidney function is slightly abnormal but stable compared to previous labs.

## 2022-03-04 NOTE — Telephone Encounter (Signed)
Pt was called and is aware of results, DOB was confirmed.  ?

## 2022-03-24 ENCOUNTER — Ambulatory Visit: Payer: Medicare Other | Attending: Family Medicine

## 2022-03-24 DIAGNOSIS — Z0000 Encounter for general adult medical examination without abnormal findings: Secondary | ICD-10-CM | POA: Diagnosis not present

## 2022-03-24 NOTE — Progress Notes (Signed)
Subjective:   Danielle Berger is a 66 y.o. female who presents for Medicare Annual (Subsequent) preventive examination.  Review of Systems     I connected with Danielle Berger on 03/24/2022 at 3:30 pm by telephone and verified that I am speaking with the correct person using two identifiers. I discussed the limitations, risks, security and privacy concerns of performing an evaluation and management service by telephone and the availability of in person appointments. I also discussed with the patient that there may be a patient responsible charge related to this service. The patient expressed understanding and agreed to proceed.   Patient location: Home My Location: [place] on the telephone call: Myself and Patinet     Cardiac Risk Factors include: none     Objective:    There were no vitals filed for this visit. There is no height or weight on file to calculate BMI.     03/24/2022    3:37 PM 10/26/2020    9:04 AM 10/11/2019   10:22 AM 10/11/2019   10:16 AM 02/22/2018   11:08 AM 11/11/2016    8:57 AM 08/08/2016    8:56 AM  Advanced Directives  Does Patient Have a Medical Advance Directive? No No No No No No No  Would patient like information on creating a medical advance directive? Yes (ED - Information included in AVS) No - Patient declined   No - Patient declined  No - Patient declined    Current Medications (verified) Outpatient Encounter Medications as of 03/24/2022  Medication Sig   atorvastatin (LIPITOR) 80 MG tablet Take 1 tablet (80 mg total) by mouth daily.   carvedilol (COREG) 12.5 MG tablet TAKE 1 TABLET BY MOUTH  TWICE DAILY WITH A MEAL   gabapentin (NEURONTIN) 300 MG capsule Take 1 capsule (300 mg total) by mouth 2 (two) times daily.   levothyroxine (SYNTHROID) 88 MCG tablet Take 1 tablet (88 mcg total) by mouth daily before breakfast.   lidocaine (LIDODERM) 5 % Place 1 patch onto the skin daily. Remove & Discard patch within 12  hours or as directed by MD   lisinopril-hydrochlorothiazide (ZESTORETIC) 10-12.5 MG tablet Take 1 tablet by mouth daily.   meloxicam (MOBIC) 7.5 MG tablet Take 1 tablet (7.5 mg total) by mouth daily.   Omega-3 Fatty Acids (FISH OIL PO) Take by mouth.   phenol (CHLORASEPTIC) 1.4 % LIQD Use as directed 1 spray in the mouth or throat as needed for throat irritation / pain.   traZODone (DESYREL) 50 MG tablet Take 1 tablet (50 mg total) by mouth at bedtime as needed for sleep.   aspirin EC 81 MG EC tablet Take 1 tablet (81 mg total) by mouth daily. (Patient not taking: Reported on 01/09/2021)   benzonatate (TESSALON) 100 MG capsule Take 1 capsule (100 mg total) by mouth every 8 (eight) hours as needed for cough. (Patient not taking: Reported on 02/28/2022)   No facility-administered encounter medications on file as of 03/24/2022.    Allergies (verified) Codeine and Penicillins   History: Past Medical History:  Diagnosis Date   Chest pain 07/17/2015   Chronic renal disease    AS A CHILD   Esophageal reflux    PT. DENIES 02/15/19   Headache    HLD (hyperlipidemia)    Hypertension    Hypothyroidism    Tobacco abuse 07/17/2015   Past Surgical History:  Procedure Laterality Date   Brain Tumor removed      when patient was  in the 6th grade   KIDNEY SURGERY     when patient was int he 8th grade   Family History  Adopted: Yes   Social History   Socioeconomic History   Marital status: Married    Spouse name: Not on file   Number of children: 1   Years of education: Not on file   Highest education level: 12th grade  Occupational History   Not on file  Tobacco Use   Smoking status: Every Day    Packs/day: 0.50    Types: Cigarettes   Smokeless tobacco: Never  Vaping Use   Vaping Use: Never used  Substance and Sexual Activity   Alcohol use: Yes    Alcohol/week: 2.0 standard drinks of alcohol    Types: 1 Glasses of wine, 1 Shots of liquor per week    Comment: occ   Drug use: No    Sexual activity: Yes    Birth control/protection: None  Other Topics Concern   Not on file  Social History Narrative   Not on file   Social Determinants of Health   Financial Resource Strain: Low Risk  (03/24/2022)   Overall Financial Resource Strain (CARDIA)    Difficulty of Paying Living Expenses: Not hard at all  Food Insecurity: No Food Insecurity (03/24/2022)   Hunger Vital Sign    Worried About Running Out of Food in the Last Year: Never true    Ran Out of Food in the Last Year: Never true  Transportation Needs: No Transportation Needs (03/24/2022)   PRAPARE - Hydrologist (Medical): No    Lack of Transportation (Non-Medical): No  Physical Activity: Inactive (03/24/2022)   Exercise Vital Sign    Days of Exercise per Week: 0 days    Minutes of Exercise per Session: 0 min  Stress: No Stress Concern Present (03/24/2022)   [place]    Feeling of Stress : Not at all  Social Connections: Moderately Isolated (03/24/2022)   Social Connection and Isolation Panel [NHANES]    Frequency of Communication with Friends and Family: More than three times a week    Frequency of Social Gatherings with Friends and Family: More than three times a week    Attends Religious Services: Never    Marine scientist or Organizations: No    Attends Music therapist: Never    Marital Status: Married    Tobacco Counseling Ready to quit: Yes Counseling given: No   Clinical Intake:  Pre-visit preparation completed: No  Pain : No/denies pain     Diabetes: No  How often do you need to have someone help you when you read instructions, pamphlets, or other written materials from your doctor or pharmacy?: 1 - Never  Diabetic?No  Interpreter Needed?: No      Activities of Daily Living    03/24/2022    3:38 PM  In your present state of health, do you have any difficulty  performing the following activities:  Hearing? 0  Vision? 0  Difficulty concentrating or making decisions? 0  Walking or climbing stairs? 0  Dressing or bathing? 0  Doing errands, shopping? 0  Preparing Food and eating ? N  Using the Toilet? N  In the past six months, have you accidently leaked urine? N  Do you have problems with loss of bowel control? N  Managing your Medications? N  Managing your Finances? N  Housekeeping or managing  your Housekeeping? N    Patient Care Team: Charlott Rakes, MD as PCP - General (Family Medicine)  Indicate any recent Medical Services you may have received from other than [facility] providers in the past year (date may be approximate).     Assessment:   This is a routine wellness examination for Danielle Berger.  Hearing/Vision screen No results found.  Dietary issues and exercise activities discussed: Current Exercise Habits: The patient does not participate in regular exercise at present, Exercise limited by: None identified   Goals Addressed   None    Depression Screen    03/24/2022    3:38 PM 03/24/2022    3:37 PM 02/28/2022    8:51 AM 07/09/2021    8:39 AM 01/09/2021    8:40 AM 10/26/2020    8:56 AM 04/10/2020   10:01 AM  PHQ 2/9 Scores  PHQ - 2 Score 0 0 0 0 0 0 0  PHQ- 9 Score   1  0  0    Fall Risk    03/24/2022    3:37 PM 02/28/2022    8:40 AM 07/09/2021    8:39 AM 10/26/2020    9:05 AM 04/10/2020    9:32 AM  [place] in the past year? 0 0 0 0 0  Number falls in past yr: 0 0 0 0 0  Injury with Fall? 0 0 0 0 0  Risk for fall due to : No Fall Risks No Fall Risks No Fall Risks Impaired mobility   Follow up    Education provided     FALL RISK PREVENTION PERTAINING TO THE HOME:  Any stairs in or around the home? No  If so, are there any without handrails? No  Home free of loose throw rugs in walkways, pet beds, electrical cords, etc? Yes  Adequate lighting in your home to reduce risk of falls? Yes   ASSISTIVE DEVICES  UTILIZED TO PREVENT FALLS:  Life alert? No  Use of a cane, walker or w/c? No  Grab bars in the bathroom? Yes  Shower chair or bench in shower? No  Elevated toilet seat or a handicapped toilet? No   TIMED UP AND GO:  Was the test performed? No .  Length of time to ambulate 10 feet: n/a sec.   Gait slow and steady without use of assistive device  Cognitive Function:    03/24/2022    3:38 PM 10/26/2020    9:09 AM 10/11/2019   10:38 AM  MMSE - Mini Mental State Exam  Orientation to time '5 5 5  '$ Orientation to Place '5 5 5  '$ Registration '3 3 3  '$ Attention/ Calculation 5 5 0  Recall '3 3 2  '$ Language- name 2 objects '2 2 2  '$ Language- repeat '1 1 1  '$ Language- follow 3 step command '3 3 1  '$ Language- read & follow direction '1 1 1  '$ Write a sentence '1 1 1  '$ Copy design '1 1 1  '$ Total score '30 30 22        '$ 03/24/2022    3:38 PM  6CIT Screen  What Year? 0 points  What month? 0 points  What time? 0 points  Count back from 20 0 points  Repeat phrase 0 points    Immunizations Immunization History  Administered Date(s) Administered   Influenza,inj,Quad PF,6+ Mos 02/19/2017, 12/14/2018, 01/09/2021, 02/10/2022   Influenza-Unspecified 02/04/2020   PFIZER(Purple Top)SARS-COV-2 Vaccination 07/01/2019, 07/22/2019, 02/25/2020   Pneumococcal Conjugate-13 10/11/2019   Pneumococcal Polysaccharide-23  07/09/2021   Tdap 06/10/2017   Zoster Recombinat (Shingrix) 04/10/2020, 06/11/2020    TDAP status: Up to date  Flu Vaccine status: Up to date  Pneumococcal vaccine status: Up to date  Covid-19 vaccine status: Completed vaccines  Qualifies for Shingles Vaccine? Yes   Zostavax completed Yes   Shingrix Completed?: Yes  Screening Tests Health Maintenance  Topic Date Due   Medicare Annual Wellness (AWV)  10/26/2021   COVID-19 Vaccine (4 - 2023-24 season) 12/13/2021   MAMMOGRAM  01/04/2022   DTaP/Tdap/Td (2 - Td or Tdap) 06/11/2027   COLONOSCOPY (Pts 45-23yr Insurance coverage will need to  be confirmed)  02/27/2029   Pneumonia Vaccine 67 Years old  Completed   INFLUENZA VACCINE  Completed   DEXA SCAN  Completed   Hepatitis C Screening  Completed   Zoster Vaccines- Shingrix  Completed   HPV VACCINES  Aged Out    Health Maintenance  Health Maintenance Due  Topic Date Due   Medicare Annual Wellness (AWV)  10/26/2021   COVID-19 Vaccine (4 - 2023-24 season) 12/13/2021   MAMMOGRAM  01/04/2022    Colorectal cancer screening: Type of screening: Colonoscopy. Completed 02/28/2019. Repeat every 10 years  Mammogram status: Completed 01/05/2020. Repeat every year  Bone Density status: Completed 01/05/2020. Results reflect: Bone density results: NORMAL. Repeat every 2 years.  Lung Cancer Screening: (Low Dose CT Chest recommended if Age 67-80years, 30 pack-year currently smoking OR have quit w/in 15years.) does qualify.   Lung Cancer Screening Referral: no  Additional Screening:  Hepatitis C Screening: does qualify; Completed 01/16/2016  Vision Screening: Recommended annual ophthalmology exams for early detection of glaucoma and other disorders of the eye. Is the patient up to date with their annual eye exam?  Yes  Who is the provider or what is the name of the office in which the patient attends annual eye exams? [facility] pt is not established with a provider, would they like to be referred to a provider to establish care? No .   Dental Screening: Recommended annual dental exams for proper oral hygiene  Community Resource Referral / Chronic Care Management: CRR required this visit?  No   CCM required this visit?  No      Plan:     I have personally reviewed and noted the following in the patient's chart:   Medical and social history Use of alcohol, tobacco or illicit drugs  Current medications and supplements including opioid prescriptions. Patient is not currently taking opioid prescriptions. Functional ability and status Nutritional status Physical  activity Advanced directives List of other physicians Hospitalizations, surgeries, and ER visits in previous 12 months Vitals Screenings to include cognitive, depression, and falls Referrals and appointments  In addition, I have reviewed and discussed with patient certain preventive protocols, quality metrics, and best practice recommendations. A written personalized care plan for preventive services as well as general preventive health recommendations were provided to patient.     AGomez Cleverly [place]ermosa  03/24/2022   Nurse Notes: I spent 30 minutes on this telephone encounter AVS mailed to patinet

## 2022-03-24 NOTE — Patient Instructions (Signed)
Danielle Berger , Thank you for taking time to come for your Medicare Wellness Visit. I appreciate your ongoing commitment to your health goals. Please review the following plan we discussed and let me know if I can assist you in the future.   These are the goals we discussed:  Goals      Quit Smoking        This is a list of the screening recommended for you and due dates:  Health Maintenance  Topic Date Due   COVID-19 Vaccine (4 - 2023-24 season) 12/13/2021   Mammogram  01/04/2022   Medicare Annual Wellness Visit  03/25/2023   DTaP/Tdap/Td vaccine (2 - Td or Tdap) 06/11/2027   Colon Cancer Screening  02/27/2029   Pneumonia Vaccine  Completed   Flu Shot  Completed   DEXA scan (bone density measurement)  Completed   Hepatitis C Screening: USPSTF Recommendation to screen - Ages 71-79 yo.  Completed   Zoster (Shingles) Vaccine  Completed   HPV Vaccine  Aged Out  Health Maintenance After Age 5 After age 53, you are at a higher risk for certain long-term diseases and infections as well as injuries from falls. Falls are a major cause of broken bones and head injuries in people who are older than age 85. Getting regular preventive care can help to keep you healthy and well. Preventive care includes getting regular testing and making lifestyle changes as recommended by your health care provider. Talk with your health care provider about: Which screenings and tests you should have. A screening is a test that checks for a disease when you have no symptoms. A diet and exercise plan that is right for you. What should I know about screenings and tests to prevent falls? Screening and testing are the best ways to find a health problem early. Early diagnosis and treatment give you the best chance of managing medical conditions that are common after age 31. Certain conditions and lifestyle choices may make you more likely to have a fall. Your health care provider may recommend: Regular vision checks.  Poor vision and conditions such as cataracts can make you more likely to have a fall. If you wear glasses, make sure to get your prescription updated if your vision changes. Medicine review. Work with your health care provider to regularly review all of the medicines you are taking, including over-the-counter medicines. Ask your health care provider about any side effects that may make you more likely to have a fall. Tell your health care provider if any medicines that you take make you feel dizzy or sleepy. Strength and balance checks. Your health care provider may recommend certain tests to check your strength and balance while standing, walking, or changing positions. Foot health exam. Foot pain and numbness, as well as not wearing proper footwear, can make you more likely to have a fall. Screenings, including: Osteoporosis screening. Osteoporosis is a condition that causes the bones to get weaker and break more easily. Blood pressure screening. Blood pressure changes and medicines to control blood pressure can make you feel dizzy. Depression screening. You may be more likely to have a fall if you have a fear of falling, feel depressed, or feel unable to do activities that you used to do. Alcohol use screening. Using too much alcohol can affect your balance and may make you more likely to have a fall. Follow these instructions at home: Lifestyle Do not drink alcohol if: Your health care provider tells you not to drink.  If you drink alcohol: Limit how much you have to: 0-1 drink a day for women. 0-2 drinks a day for men. Know how much alcohol is in your drink. In the [place] one drink equals one 12 oz bottle of beer (355 mL), one 5 oz glass of wine (148 mL), or one 1 oz glass of hard liquor (44 mL). Do not use any products that contain nicotine or tobacco. These products include cigarettes, chewing tobacco, and vaping devices, such as e-cigarettes. If you need help quitting, ask your health care  provider. Activity  Follow a regular exercise program to stay fit. This will help you maintain your balance. Ask your health care provider what types of exercise are appropriate for you. If you need a cane or walker, use it as recommended by your health care provider. Wear supportive shoes that have nonskid soles. Safety  Remove any tripping hazards, such as rugs, cords, and clutter. Install safety equipment such as grab bars in bathrooms and safety rails on stairs. Keep rooms and walkways well-lit. General instructions Talk with your health care provider about your risks for falling. Tell your health care provider if: You fall. Be sure to tell your health care provider about all falls, even ones that seem minor. You feel dizzy, tiredness (fatigue), or off-balance. Take over-the-counter and prescription medicines only as told by your health care provider. These include supplements. Eat a healthy diet and maintain a healthy weight. A healthy diet includes low-fat dairy products, low-fat (lean) meats, and fiber from whole grains, beans, and lots of fruits and vegetables. Stay current with your vaccines. Schedule regular health, dental, and eye exams. Summary Having a healthy lifestyle and getting preventive care can help to protect your health and wellness after age 74. Screening and testing are the best way to find a health problem early and help you avoid having a fall. Early diagnosis and treatment give you the best chance for managing medical conditions that are more common for people who are older than age 38. Falls are a major cause of broken bones and head injuries in people who are older than age 74. Take precautions to prevent a fall at home. Work with your health care provider to learn what changes you can make to improve your health and wellness and to prevent falls. This information is not intended to replace advice given to you by your health care provider. Make sure you discuss  any questions you have with your health care provider. Document Revised: 08/20/2020 Document Reviewed: 08/20/2020 Elsevier Patient Education  [place].

## 2022-05-06 ENCOUNTER — Ambulatory Visit: Payer: Medicare Other

## 2022-05-11 ENCOUNTER — Other Ambulatory Visit: Payer: Self-pay | Admitting: Family Medicine

## 2022-05-11 DIAGNOSIS — E038 Other specified hypothyroidism: Secondary | ICD-10-CM

## 2022-05-12 NOTE — Telephone Encounter (Signed)
Rx was sent to pharmacy on 03/03/22 #90/1  Requested Prescriptions  Pending Prescriptions Disp Refills   levothyroxine (SYNTHROID) 88 MCG tablet [Pharmacy Med Name: Levothyroxine Sodium 88 MCG Oral Tablet] 100 tablet 2    Sig: TAKE 1 TABLET BY MOUTH DAILY  BEFORE BREAKFAST     Endocrinology:  Hypothyroid Agents Passed - 05/11/2022  8:11 AM      Passed - TSH in normal range and within 360 days    TSH  Date Value Ref Range Status  02/28/2022 1.280 0.450 - 4.500 uIU/mL Final         Passed - Valid encounter within last 12 months    Recent Outpatient Visits           2 months ago Essential hypertension   [place], Charlane Ferretti, MD   10 months ago Hip pain   [place], Enobong, MD   1 year ago Essential hypertension   [place], Enobong, MD   1 year ago Hip pain   [place], Enobong, MD   1 year ago Need for zoster vaccination   [place], RPH-CPP       Future Appointments             In 3 months Charlott Rakes, MD [place]

## 2022-07-29 ENCOUNTER — Other Ambulatory Visit: Payer: Self-pay | Admitting: Family Medicine

## 2022-07-29 DIAGNOSIS — E038 Other specified hypothyroidism: Secondary | ICD-10-CM

## 2022-09-01 ENCOUNTER — Ambulatory Visit: Payer: Medicare Other | Attending: Family Medicine | Admitting: Family Medicine

## 2022-09-01 ENCOUNTER — Encounter: Payer: Self-pay | Admitting: Family Medicine

## 2022-09-01 VITALS — BP 131/71 | HR 57 | Temp 97.6°F | Ht 62.0 in | Wt 155.6 lb

## 2022-09-01 DIAGNOSIS — I129 Hypertensive chronic kidney disease with stage 1 through stage 4 chronic kidney disease, or unspecified chronic kidney disease: Secondary | ICD-10-CM | POA: Diagnosis not present

## 2022-09-01 DIAGNOSIS — E782 Mixed hyperlipidemia: Secondary | ICD-10-CM

## 2022-09-01 DIAGNOSIS — E1122 Type 2 diabetes mellitus with diabetic chronic kidney disease: Secondary | ICD-10-CM

## 2022-09-01 DIAGNOSIS — E038 Other specified hypothyroidism: Secondary | ICD-10-CM | POA: Diagnosis not present

## 2022-09-01 DIAGNOSIS — F17210 Nicotine dependence, cigarettes, uncomplicated: Secondary | ICD-10-CM

## 2022-09-01 DIAGNOSIS — N1832 Chronic kidney disease, stage 3b: Secondary | ICD-10-CM

## 2022-09-01 DIAGNOSIS — R2689 Other abnormalities of gait and mobility: Secondary | ICD-10-CM

## 2022-09-01 DIAGNOSIS — G4709 Other insomnia: Secondary | ICD-10-CM

## 2022-09-01 MED ORDER — CARVEDILOL 12.5 MG PO TABS: ORAL_TABLET | ORAL | 3 refills | Status: DC

## 2022-09-01 MED ORDER — ATORVASTATIN CALCIUM 80 MG PO TABS: 80.0000 mg | ORAL_TABLET | Freq: Every day | ORAL | 3 refills | Status: DC

## 2022-09-01 MED ORDER — TRAZODONE HCL 50 MG PO TABS: 50.0000 mg | ORAL_TABLET | Freq: Every evening | ORAL | 3 refills | Status: DC | PRN

## 2022-09-01 MED ORDER — LISINOPRIL-HYDROCHLOROTHIAZIDE 10-12.5 MG PO TABS: 1.0000 | ORAL_TABLET | Freq: Every day | ORAL | 3 refills | Status: DC

## 2022-09-01 NOTE — Progress Notes (Signed)
Subjective:  Patient ID: Danielle Berger, female    DOB: [birth year]  Age: 67 y.o. MRN: [id number]  CC: Hypertension   HPI Danielle Berger is a 67 y.o. year old female with a history of obacco abuse, hypertension, hyperlipidemia, hypothyroidism, stage III CKD.  Insomnia who comes into the clinic for a follow-up visit.  Interval History:  She has noticed feeling off balance once in a while sporadically. She would just be walking and notices she tilts to once side but then she catches herself. It just started recently. States it is not bothersome and states she does not think it is that bad but she just wanted to mention. The room does not spin and she does not get dizzy.  Has presence of sinus symptoms.   Her hip pain is better and she states she does not need Gabapentin anymore. She was taught hip exercises which has been beneficial.  Endorses adherence with her antihypertensive and also with levothyroxine. She is closely followed by [facility] for management of her stage III CKD. Past Medical History:  Diagnosis Date   Chest pain 07/17/2015   Chronic renal disease    AS A CHILD   Esophageal reflux    PT. DENIES 02/15/19   Headache    HLD (hyperlipidemia)    Hypertension    Hypothyroidism    Tobacco abuse 07/17/2015    Past Surgical History:  Procedure Laterality Date   Brain Tumor removed      when patient was in the 6th grade   KIDNEY SURGERY     when patient was int he 8th grade    Family History  Adopted: Yes    Social History   Socioeconomic History   Marital status: Married    Spouse name: Not on file   Number of children: 1   Years of education: Not on file   Highest education level: 12th grade  Occupational History   Not on file  Tobacco Use   Smoking status: Every Day    Packs/day: .5    Types: Cigarettes   Smokeless tobacco: Never  Vaping Use   Vaping Use: Never used  Substance and Sexual Activity   Alcohol use: Yes     Alcohol/week: 2.0 standard drinks of alcohol    Types: 1 Glasses of wine, 1 Shots of liquor per week    Comment: occ   Drug use: No   Sexual activity: Yes    Birth control/protection: None  Other Topics Concern   Not on file  Social History Narrative   Not on file   Social Determinants of Health   Financial Resource Strain: Low Risk  (03/24/2022)   Overall Financial Resource Strain (CARDIA)    Difficulty of Paying Living Expenses: Not hard at all  Food Insecurity: No Food Insecurity (03/24/2022)   Hunger Vital Sign    Worried About Running Out of Food in the Last Year: Never true    Ran Out of Food in the Last Year: Never true  Transportation Needs: No Transportation Needs (03/24/2022)   PRAPARE - Administrator, Civil Service (Medical): No    Lack of Transportation (Non-Medical): No  Physical Activity: Inactive (03/24/2022)   Exercise Vital Sign    Days of Exercise per Week: 0 days    Minutes of Exercise per Session: 0 min  Stress: No Stress Concern Present (03/24/2022)   [employer] of Occupational Health - Occupational Stress Questionnaire    Feeling of Stress :  Not at all  Social Connections: Moderately Isolated (03/24/2022)   Social Connection and Isolation Panel [NHANES]    Frequency of Communication with Friends and Family: More than three times a week    Frequency of Social Gatherings with Friends and Family: More than three times a week    Attends Religious Services: Never    Database administrator or Organizations: No    Attends Banker Meetings: Never    Marital Status: Married    Allergies  Allergen Reactions   Codeine Nausea And Vomiting   Penicillins Rash    Outpatient Medications Prior to Visit  Medication Sig Dispense Refill   aspirin EC 81 MG EC tablet Take 1 tablet (81 mg total) by mouth daily.     levothyroxine (SYNTHROID) 88 MCG tablet TAKE 1 TABLET BY MOUTH DAILY  BEFORE BREAKFAST 90 tablet 0   lidocaine  (LIDODERM) 5 % Place 1 patch onto the skin daily. Remove & Discard patch within 12 hours or as directed by MD 30 patch 3   meloxicam (MOBIC) 7.5 MG tablet Take 1 tablet (7.5 mg total) by mouth daily. 30 tablet 1   Omega-3 Fatty Acids (FISH OIL PO) Take by mouth.     atorvastatin (LIPITOR) 80 MG tablet Take 1 tablet (80 mg total) by mouth daily. 90 tablet 3   carvedilol (COREG) 12.5 MG tablet TAKE 1 TABLET BY MOUTH  TWICE DAILY WITH A MEAL 180 tablet 3   lisinopril-hydrochlorothiazide (ZESTORETIC) 10-12.5 MG tablet Take 1 tablet by mouth daily. 90 tablet 3   traZODone (DESYREL) 50 MG tablet Take 1 tablet (50 mg total) by mouth at bedtime as needed for sleep. 90 tablet 3   gabapentin (NEURONTIN) 300 MG capsule Take 1 capsule (300 mg total) by mouth 2 (two) times daily. (Patient not taking: Reported on 09/01/2022) 180 capsule 3   benzonatate (TESSALON) 100 MG capsule Take 1 capsule (100 mg total) by mouth every 8 (eight) hours as needed for cough. (Patient not taking: Reported on 02/28/2022) 21 capsule 0   phenol (CHLORASEPTIC) 1.4 % LIQD Use as directed 1 spray in the mouth or throat as needed for throat irritation / pain. (Patient not taking: Reported on 09/01/2022) 118 mL 0   No facility-administered medications prior to visit.     ROS Review of Systems  Constitutional:  Negative for activity change and appetite change.  HENT:  Negative for sinus pressure and sore throat.   Respiratory:  Negative for chest tightness, shortness of breath and wheezing.   Cardiovascular:  Negative for chest pain and palpitations.  Gastrointestinal:  Negative for abdominal distention, abdominal pain and constipation.  Genitourinary: Negative.   Musculoskeletal:  Positive for gait problem.  Psychiatric/Behavioral:  Negative for behavioral problems and dysphoric mood.     Objective:  BP 131/71   Pulse (!) 57   Temp 97.6 F (36.4 C) (Oral)   Ht 5\' 2"  (1.575 m)   Wt 155 lb 9.6 oz (70.6 kg)   SpO2 98%   BMI  28.46 kg/m      09/01/2022    8:42 AM 02/28/2022    9:21 AM 02/28/2022    8:41 AM  BP/Weight  Systolic BP 131 121 150  Diastolic BP 71 74 84  Wt. (Lbs) 155.6  159.2  BMI 28.46 kg/m2  29.12 kg/m2      Physical Exam Constitutional:      Appearance: She is well-developed.  HENT:     Right Ear: There is impacted  cerumen.     Left Ear: There is no impacted cerumen.     Mouth/Throat:     Mouth: Mucous membranes are moist.  Cardiovascular:     Rate and Rhythm: Bradycardia present.     Heart sounds: Normal heart sounds. No murmur heard. Pulmonary:     Effort: Pulmonary effort is normal.     Breath sounds: Normal breath sounds. No wheezing or rales.  Chest:     Chest wall: No tenderness.  Abdominal:     General: Bowel sounds are normal. There is no distension.     Palpations: Abdomen is soft. There is no mass.     Tenderness: There is no abdominal tenderness.  Musculoskeletal:        General: Normal range of motion.     Right lower leg: No edema.     Left lower leg: No edema.  Neurological:     Mental Status: She is alert and oriented to person, place, and time.     Motor: No weakness.     Coordination: Coordination normal.     Gait: Gait normal.     Deep Tendon Reflexes: Reflexes normal.  Psychiatric:        Mood and Affect: Mood normal.        Latest Ref Rng & Units 02/28/2022    9:30 AM 07/09/2021    9:19 AM 01/09/2021    9:06 AM  CMP  Glucose 70 - 99 mg/dL 409  811  914   BUN 8 - 27 mg/dL 23  22  25    Creatinine 0.57 - 1.00 mg/dL 7.82  9.56  2.13   Sodium 134 - 144 mmol/L 143  143  139   Potassium 3.5 - 5.2 mmol/L 4.7  4.8  5.5   Chloride 96 - 106 mmol/L 102  101  99   CO2 20 - 29 mmol/L 26  26  25    Calcium 8.7 - 10.3 mg/dL 9.6  9.6  9.8   Total Protein 6.0 - 8.5 g/dL 6.8  6.6  6.9   Total Bilirubin 0.0 - 1.2 mg/dL 0.3  0.3  0.3   Alkaline Phos 44 - 121 IU/L 104  99  102   AST 0 - 40 IU/L 20  22  23    ALT 0 - 32 IU/L 19  19  20      Lipid Panel      Component Value Date/Time   CHOL 189 02/28/2022 0930   TRIG 152 (H) 02/28/2022 0930   HDL 46 02/28/2022 0930   CHOLHDL 4.8 (H) 01/09/2021 0906   CHOLHDL 3.7 05/07/2016 0946   VLDL 34 (H) 05/07/2016 0946   LDLCALC 116 (H) 02/28/2022 0930    CBC    Component Value Date/Time   WBC 5.1 07/09/2021 0919   WBC 7.6 07/17/2015 1011   RBC 4.36 07/09/2021 0919   RBC 5.08 07/17/2015 1011   HGB 12.8 07/09/2021 0919   HCT 39.6 07/09/2021 0919   PLT 249 07/09/2021 0919   MCV 91 07/09/2021 0919   MCH 29.4 07/09/2021 0919   MCH 28.3 07/17/2015 1011   MCHC 32.3 07/09/2021 0919   MCHC 32.0 07/17/2015 1011   RDW 12.6 07/09/2021 0919   LYMPHSABS 1.6 07/09/2021 0919   MONOABS 0.5 07/17/2015 1011   EOSABS 0.0 07/09/2021 0919   BASOSABS 0.0 07/09/2021 0919    Lab Results  Component Value Date   HGBA1C 6.1 (H) 02/28/2022    Lab Results  Component Value Date  TSH 1.280 02/28/2022    Assessment & Plan:  1. Mixed hyperlipidemia LDL of 116 slightly above goal of less than 100 Continue with statin Will check lipid panel Low-cholesterol diet - LP+Non-HDL Cholesterol - atorvastatin (LIPITOR) 80 MG tablet; Take 1 tablet (80 mg total) by mouth daily.  Dispense: 90 tablet; Refill: 3  2. 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 Discussed medication compliance, adverse effects. - CMP14+EGFR - CBC with Differential/Platelet - carvedilol (COREG) 12.5 MG tablet; TAKE 1 TABLET BY MOUTH  TWICE DAILY WITH A MEAL  Dispense: 180 tablet; Refill: 3 - lisinopril-hydrochlorothiazide (ZESTORETIC) 10-12.5 MG tablet; Take 1 tablet by mouth daily.   Dispense: 90 tablet; Refill: 3  3. Other specified hypothyroidism Controlled - T4, free - TSH - T3  4. Balance problem Neuroexam is reassuring with no signs of cerebellar dysfunction Discussed possible etiologies including sinus symptoms, vertigo If symptoms persist consider CT head  5. Other insomnia Controlled - traZODone (DESYREL) 50 MG tablet; Take 1 tablet (50 mg total) by mouth at bedtime as needed for sleep.  Dispense: 90 tablet; Refill: 3   Health Care Maintenance: mammogram  ordered but she canceled and plans to reschedule Meds ordered this encounter  Medications   atorvastatin (LIPITOR) 80 MG tablet    Sig: Take 1 tablet (80 mg total) by mouth daily.    Dispense:  90 tablet    Refill:  3   carvedilol (COREG) 12.5 MG tablet    Sig: TAKE 1 TABLET BY MOUTH  TWICE DAILY WITH A MEAL    Dispense:  180 tablet    Refill:  3   lisinopril-hydrochlorothiazide (ZESTORETIC) 10-12.5 MG tablet    Sig: Take 1 tablet by mouth daily.    Dispense:  90 tablet    Refill:  3    Requesting 1 year supply   traZODone (DESYREL) 50 MG tablet    Sig: Take 1 tablet (50 mg total) by mouth at bedtime as needed for sleep.    Dispense:  90 tablet    Refill:  3    Follow-up: Return in about 6 months (around 03/04/2023).       Hoy Register, MD, FAAFP. [facility] [place], [place] [phone]   09/01/2022, 9:08 AM

## 2022-09-01 NOTE — Patient Instructions (Signed)
Ataxia Ataxia is a condition that causes you to be unsteady on your feet when you walk or stand. It can also cause poor coordination of body movements and trouble standing up straight (upright). Ataxia can show up later in life (acquired ataxia), during your 20s or 30s, or into your 60s or later. It also may be present early in life (nonacquired ataxia). There are two main types of nonacquired ataxia: Congenital. This type is present at birth. Hereditary. This type is passed from parent to child. The most common type is Friedreich ataxia. What are the causes?  This condition may be caused by a problem with the part of the brain that deals with coordination and stability (cerebellum). It may form when a condition, such as a stroke or head injury, damages your cerebellum. Acquired ataxia may also be caused by: Neurodegenerative changes. These are changes to your nervous system. Systemic disorders. These are disorders that may affect your whole body. A lot of exposure to: Certain medicines. These include phenytoin and lithium. Solvents. These are cleaning fluids, such as paint thinner, nail polish remover, carpet cleaner, and degreasers. Certain conditions, such as: Alcohol use disorder. Celiac disease. Hypothyroidism. A lack (deficiency) of vitamin E, vitamin B12, or thiamine. An abnormal growth of cells in the brain (brain tumor). Multiple sclerosis. Cerebral palsy. Paraneoplastic syndromes. These are rare disorders. They are caused by the way the body's defense system (immune system) responds to cancer. Viral infections. The congenital and hereditary forms of this condition are caused by problems that are present in genes before birth. What are the signs or symptoms? Signs and symptoms may vary and depend on the cause of the condition. They may include: Being unsteady. Walking with your legs wide apart to keep your balance. Body movements that are not well coordinated. Trouble keeping  an upright posture. Trouble writing. Shaking that you cannot control (tremors). Sudden muscle tightening (spasms). Other symptoms may include: Eye movements that you cannot control. Changes in speech, vision, or both. Trouble swallowing. Tiredness (fatigue). How is this diagnosed? This condition may be diagnosed based on: Your medical history. Your family medical history. A physical exam. You may also have tests, such as: CT scan. MRI. Spinal tap. This is when a needle is used to take a sample of the fluid around your brain and spinal cord. It is also called a lumbar puncture. Genetic testing. Blood tests. How is this treated? Treatment for this condition depends on what is causing it. If the cause is a brain tumor, you may need surgery. Treatment also focuses on making your quality of life better. This may involve: Physical therapy. This helps you learn ways to improve coordination and move around more carefully. Occupational therapy. This can help you with doing daily tasks, such as bathing and feeding yourself. Using assistive devices. These are devices to help you move around, eat, or communicate. They include walkers, modified eating utensils, and communication aids. Speech therapy. This helps you learn ways to communicate and improve swallowing. Follow these instructions at home: Preventing falls Lie down right away if you become very unsteady, dizzy, or nauseous, or if you feel like you are going to faint. Do not get up until all of those feelings pass. Keep your home well-lit. Use night-lights as needed. Remove tripping hazards. These may include throw rugs, cords, and clutter. Install grab bars by the toilet and in the bathtub and shower. Use assistive devices, such as a cane, walker, or wheelchair, as needed to keep your   balance. General instructions Take over-the-counter and prescription medicines only as told by your health care provider. Do not drink alcohol. Ask  your health care provider what activities are safe for you. Keep all follow-up appointments to make sure all your needs are being met and to catch any new problems early. Where to find support National Ataxia Foundation: ataxia.org Contact a health care provider if: You have new symptoms. Your symptoms get worse. Get help right away if: Your unsteadiness gets worse all of a sudden. You have weakness or numbness on one side of your body. You feel confused. You have trouble speaking. You have: A severe headache. Chest pain. An irregular heartbeat. A very fast pulse. Pain in your abdomen. These symptoms may be an emergency. Get help right away. Call 911. Do not wait to see if the symptoms will go away. Do not drive yourself to the hospital. This information is not intended to replace advice given to you by your health care provider. Make sure you discuss any questions you have with your health care provider. Document Revised: 10/14/2021 Document Reviewed: 10/14/2021 Elsevier Patient Education  2023 Elsevier Inc.  

## 2022-09-01 NOTE — Progress Notes (Signed)
Off balance at times.

## 2022-09-02 ENCOUNTER — Other Ambulatory Visit: Payer: Self-pay | Admitting: Family Medicine

## 2022-09-02 DIAGNOSIS — E038 Other specified hypothyroidism: Secondary | ICD-10-CM

## 2022-09-02 LAB — CMP14+EGFR
ALT: 20 IU/L (ref 0–32)
AST: 22 IU/L (ref 0–40)
Albumin/Globulin Ratio: 2 (ref 1.2–2.2)
Albumin: 4.4 g/dL (ref 3.9–4.9)
Alkaline Phosphatase: 107 IU/L (ref 44–121)
BUN/Creatinine Ratio: 15 (ref 12–28)
BUN: 21 mg/dL (ref 8–27)
Bilirubin Total: 0.4 mg/dL (ref 0.0–1.2)
CO2: 23 mmol/L (ref 20–29)
Calcium: 9.3 mg/dL (ref 8.7–10.3)
Chloride: 104 mmol/L (ref 96–106)
Creatinine, Ser: 1.39 mg/dL — ABNORMAL HIGH (ref 0.57–1.00)
Globulin, Total: 2.2 g/dL (ref 1.5–4.5)
Glucose: 100 mg/dL — ABNORMAL HIGH (ref 70–99)
Potassium: 4.6 mmol/L (ref 3.5–5.2)
Sodium: 142 mmol/L (ref 134–144)
Total Protein: 6.6 g/dL (ref 6.0–8.5)
eGFR: 41 mL/min/{1.73_m2} — ABNORMAL LOW (ref >59)

## 2022-09-02 LAB — CBC WITH DIFFERENTIAL/PLATELET
Basophils Absolute: 0.1 10*3/uL (ref 0.0–0.2)
Basos: 1 %
EOS (ABSOLUTE): 0 10*3/uL (ref 0.0–0.4)
Eos: 1 %
Hematocrit: 39.9 % (ref 34.0–46.6)
Hemoglobin: 13 g/dL (ref 11.1–15.9)
Immature Grans (Abs): 0 10*3/uL (ref 0.0–0.1)
Immature Granulocytes: 0 %
Lymphocytes Absolute: 1.4 10*3/uL (ref 0.7–3.1)
Lymphs: 27 %
MCH: 29.6 pg (ref 26.6–33.0)
MCHC: 32.6 g/dL (ref 31.5–35.7)
MCV: 91 fL (ref 79–97)
Monocytes Absolute: 0.4 10*3/uL (ref 0.1–0.9)
Monocytes: 8 %
Neutrophils Absolute: 3.3 10*3/uL (ref 1.4–7.0)
Neutrophils: 63 %
Platelets: 239 10*3/uL (ref 150–450)
RBC: 4.39 x10E6/uL (ref 3.77–5.28)
RDW: 12.4 % (ref 11.7–15.4)
WBC: 5.2 10*3/uL (ref 3.4–10.8)

## 2022-09-02 LAB — LP+NON-HDL CHOLESTEROL
Cholesterol, Total: 173 mg/dL (ref 100–199)
HDL: 45 mg/dL (ref >39)
LDL Chol Calc (NIH): 100 mg/dL — ABNORMAL HIGH (ref 0–99)
Total Non-HDL-Chol (LDL+VLDL): 128 mg/dL (ref 0–129)
Triglycerides: 159 mg/dL — ABNORMAL HIGH (ref 0–149)
VLDL Cholesterol Cal: 28 mg/dL (ref 5–40)

## 2022-09-02 LAB — T3: T3, Total: 108 ng/dL (ref 71–180)

## 2022-09-02 LAB — TSH: TSH: 0.054 u[IU]/mL — ABNORMAL LOW (ref 0.450–4.500)

## 2022-09-02 LAB — T4, FREE: Free T4: 2.23 ng/dL — ABNORMAL HIGH (ref 0.82–1.77)

## 2022-09-02 MED ORDER — LEVOTHYROXINE SODIUM 75 MCG PO TABS: 75.0000 ug | ORAL_TABLET | Freq: Every day | ORAL | 1 refills | Status: DC

## 2022-09-05 ENCOUNTER — Telehealth: Payer: Self-pay | Admitting: Family Medicine

## 2022-09-05 NOTE — Telephone Encounter (Signed)
Pt given lab results per notes of Dr Alvis Lemmings on 09/05/22. Pt verbalized understanding.Lab appt ordered.

## 2022-09-05 NOTE — Telephone Encounter (Signed)
Pt requesting a call back for lab results. Please advise

## 2022-09-29 IMAGING — CR DG PELVIS 1-2V
1 series · 1 of 1 positions shown · non-contrast
Comparison: None.

CLINICAL DATA: Left hip pain.  No known injury.

EXAM:
PELVIS - 1-2 VIEW

[t pelvis a.p.]
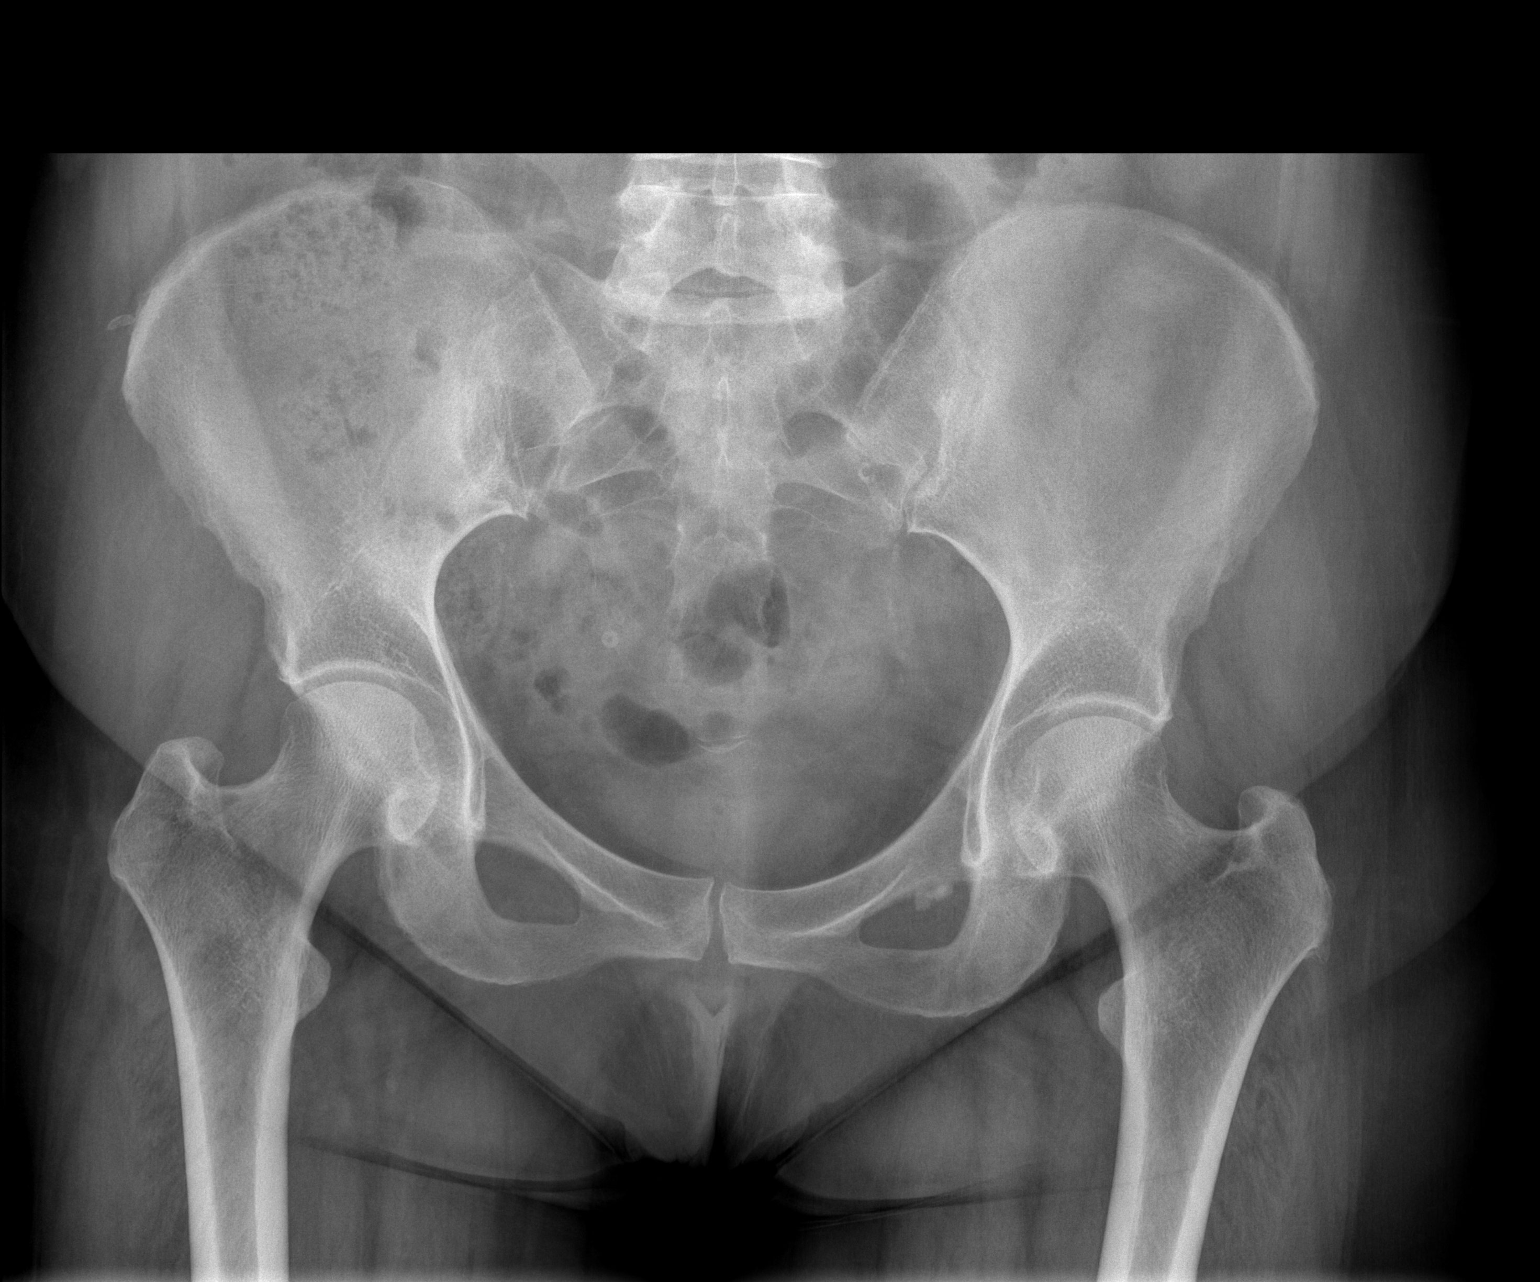

[1 of 1 positions shown; findings below may reference images not displayed]

FINDINGS: There is no evidence of pelvic fracture or diastasis. No pelvic bone
lesions are seen.
IMPRESSION: Negative.

## 2022-10-17 ENCOUNTER — Ambulatory Visit: Payer: Medicare Other | Attending: Family Medicine

## 2022-10-17 DIAGNOSIS — E038 Other specified hypothyroidism: Secondary | ICD-10-CM

## 2022-10-18 LAB — T3: T3, Total: 91 ng/dL (ref 71–180)

## 2022-10-18 LAB — T4, FREE: Free T4: 1.4 ng/dL (ref 0.82–1.77)

## 2022-10-18 LAB — TSH: TSH: 2.17 u[IU]/mL (ref 0.450–4.500)

## 2022-11-06 ENCOUNTER — Other Ambulatory Visit: Payer: Self-pay | Admitting: Family Medicine

## 2022-11-06 DIAGNOSIS — E038 Other specified hypothyroidism: Secondary | ICD-10-CM

## 2022-11-07 NOTE — Telephone Encounter (Signed)
Requested Prescriptions  Pending Prescriptions Disp Refills   levothyroxine (SYNTHROID) 75 MCG tablet [Pharmacy Med Name: Levothyroxine Sodium 75 MCG Oral Tablet] 100 tablet 1    Sig: TAKE 1 TABLET BY MOUTH DAILY  BEFORE BREAKFAST     Endocrinology:  Hypothyroid Agents Passed - 11/06/2022 11:00 PM      Passed - TSH in normal range and within 360 days    TSH  Date Value Ref Range Status  10/17/2022 2.170 0.450 - 4.500 uIU/mL Final         Passed - Valid encounter within last 12 months    Recent Outpatient Visits           2 months ago Other specified hypothyroidism   [place] [facility] [place], Odette Horns, MD   8 months ago Essential hypertension   [place] [facility] Hoy Register, MD   1 year ago Hip pain   [place] [facility] Hoy Register, MD   1 year ago Essential hypertension   [place] [facility] Hoy Register, MD   2 years ago Hip pain   [place] [facility] Hoy Register, MD       Future Appointments             In 3 months Hoy Register, MD [facility]

## 2022-11-16 ENCOUNTER — Other Ambulatory Visit: Payer: Self-pay | Admitting: Family Medicine

## 2022-11-16 DIAGNOSIS — M25559 Pain in unspecified hip: Secondary | ICD-10-CM

## 2022-11-17 NOTE — Telephone Encounter (Signed)
Requested Prescriptions  Pending Prescriptions Disp Refills   gabapentin (NEURONTIN) 300 MG capsule [Pharmacy Med Name: GABAPENTIN CAP 300MG  (NEUR)] 200 capsule 0    Sig: TAKE 1 CAPSULE BY MOUTH TWICE  DAILY     Neurology: Anticonvulsants - gabapentin Failed - 11/16/2022 10:09 PM      Failed - Cr in normal range and within 360 days    Creat  Date Value Ref Range Status  05/07/2016 1.11 (H) 0.50 - 0.99 mg/dL Final    Comment:      For patients > or = 68 years of age: The upper reference limit for Creatinine is approximately 13% higher for people identified as African-American.      Creatinine, Ser  Date Value Ref Range Status  09/01/2022 1.39 (H) 0.57 - 1.00 mg/dL Final         Passed - Completed PHQ-2 or PHQ-9 in the last 360 days      Passed - Valid encounter within last 12 months    Recent Outpatient Visits           2 months ago Other specified hypothyroidism   [place] [facility] [place], Odette Horns, MD   8 months ago Essential hypertension   [place] [facility] Hoy Register, MD   1 year ago Hip pain   [place] [facility] Hoy Register, MD   1 year ago Essential hypertension   [place] [facility] Hoy Register, MD   2 years ago Hip pain   [place] [facility] Hoy Register, MD       Future Appointments             In 3 months Hoy Register, MD [facility]

## 2022-12-15 NOTE — Progress Notes (Unsigned)
Subjective:   Danielle Berger is a 67 y.o. female who presents for Medicare Annual (Subsequent) preventive examination.  Visit Complete: {VISITMETHOD:[phone]}  Patient Medicare AWV questionnaire was completed by the patient on ***; I have confirmed that all information answered by patient is correct and no changes since this date.  Vital Signs: {telehealth vitals:30100}  Review of Systems    ***       Objective:    There were no vitals filed for this visit. There is no height or weight on file to calculate BMI.     03/24/2022    3:37 PM 10/26/2020    9:04 AM 10/11/2019   10:22 AM 10/11/2019   10:16 AM 02/22/2018   11:08 AM 11/11/2016    8:57 AM 08/08/2016    8:56 AM  Advanced Directives  Does Patient Have a Medical Advance Directive? No No No No No No No  Would patient like information on creating a medical advance directive? Yes (ED - Information included in AVS) No - Patient declined   No - Patient declined  No - Patient declined    Current Medications (verified) Outpatient Encounter Medications as of 12/16/2022  Medication Sig   aspirin EC 81 MG EC tablet Take 1 tablet (81 mg total) by mouth daily.   atorvastatin (LIPITOR) 80 MG tablet Take 1 tablet (80 mg total) by mouth daily.   carvedilol (COREG) 12.5 MG tablet TAKE 1 TABLET BY MOUTH  TWICE DAILY WITH A MEAL   gabapentin (NEURONTIN) 300 MG capsule TAKE 1 CAPSULE BY MOUTH TWICE  DAILY   levothyroxine (SYNTHROID) 75 MCG tablet TAKE 1 TABLET BY MOUTH DAILY  BEFORE BREAKFAST   lidocaine (LIDODERM) 5 % Place 1 patch onto the skin daily. Remove & Discard patch within 12 hours or as directed by MD   lisinopril-hydrochlorothiazide (ZESTORETIC) 10-12.5 MG tablet Take 1 tablet by mouth daily.   meloxicam (MOBIC) 7.5 MG tablet Take 1 tablet (7.5 mg total) by mouth daily.   Omega-3 Fatty Acids (FISH OIL PO) Take by mouth.   traZODone (DESYREL) 50 MG tablet Take 1 tablet (50 mg total) by mouth at bedtime as needed for  sleep.   No facility-administered encounter medications on file as of 12/16/2022.    Allergies (verified) Codeine and Penicillins   History: Past Medical History:  Diagnosis Date   Chest pain 07/17/2015   Chronic renal disease    AS A CHILD   Esophageal reflux    PT. DENIES 02/15/19   Headache    HLD (hyperlipidemia)    Hypertension    Hypothyroidism    Tobacco abuse 07/17/2015   Past Surgical History:  Procedure Laterality Date   Brain Tumor removed      when patient was in the 6th grade   KIDNEY SURGERY     when patient was int he 8th grade   Family History  Adopted: Yes   Social History   Socioeconomic History   Marital status: Married    Spouse name: Not on file   Number of children: 1   Years of education: Not on file   Highest education level: 12th grade  Occupational History   Not on file  Tobacco Use   Smoking status: Every Day    Current packs/day: 0.50    Types: Cigarettes   Smokeless tobacco: Never  Vaping Use   Vaping status: Never Used  Substance and Sexual Activity   Alcohol use: Yes    Alcohol/week: 2.0 standard drinks of alcohol  Types: 1 Glasses of wine, 1 Shots of liquor per week    Comment: occ   Drug use: No   Sexual activity: Yes    Birth control/protection: None  Other Topics Concern   Not on file  Social History Narrative   Not on file   Social Determinants of Health   Financial Resource Strain: Low Risk  (03/24/2022)   Overall Financial Resource Strain (CARDIA)    Difficulty of Paying Living Expenses: Not hard at all  Food Insecurity: No Food Insecurity (03/24/2022)   Hunger Vital Sign    Worried About Running Out of Food in the Last Year: Never true    Ran Out of Food in the Last Year: Never true  Transportation Needs: No Transportation Needs (03/24/2022)   PRAPARE - Administrator, Civil Service (Medical): No    Lack of Transportation (Non-Medical): No  Physical Activity: Inactive (03/24/2022)   Exercise Vital  Sign    Days of Exercise per Week: 0 days    Minutes of Exercise per Session: 0 min  Stress: No Stress Concern Present (03/24/2022)   [employer] of Occupational Health - Occupational Stress Questionnaire    Feeling of Stress : Not at all  Social Connections: Moderately Isolated (03/24/2022)   Social Connection and Isolation Panel [NHANES]    Frequency of Communication with Friends and Family: More than three times a week    Frequency of Social Gatherings with Friends and Family: More than three times a week    Attends Religious Services: Never    Database administrator or Organizations: No    Attends Banker Meetings: Never    Marital Status: Married    Tobacco Counseling Ready to quit: Not Answered Counseling given: Not Answered   Clinical Intake:                        Activities of Daily Living    03/24/2022    3:38 PM  In your present state of health, do you have any difficulty performing the following activities:  Hearing? 0  Vision? 0  Difficulty concentrating or making decisions? 0  Walking or climbing stairs? 0  Dressing or bathing? 0  Doing errands, shopping? 0  Preparing Food and eating ? N  Using the Toilet? N  In the past six months, have you accidently leaked urine? N  Do you have problems with loss of bowel control? N  Managing your Medications? N  Managing your Finances? N  Housekeeping or managing your Housekeeping? N    Patient Care Team: Hoy Register, MD as PCP - General (Family Medicine)  Indicate any recent Medical Services you may have received from other than [facility] providers in the past year (date may be approximate).     Assessment:   This is a routine wellness examination for [place].  Hearing/Vision screen No results found.  Dietary issues and exercise activities discussed:     Goals Addressed   None    Depression Screen    09/01/2022    8:44 AM 03/24/2022    3:38 PM 03/24/2022    3:37 PM  02/28/2022    8:51 AM 07/09/2021    8:39 AM 01/09/2021    8:40 AM 10/26/2020    8:56 AM  PHQ 2/9 Scores  PHQ - 2 Score 0 0 0 0 0 0 0  PHQ- 9 Score 0   1  0     Fall Risk  09/01/2022    8:44 AM 03/24/2022    3:37 PM 02/28/2022    8:40 AM 07/09/2021    8:39 AM 10/26/2020    9:05 AM  Fall Risk   Falls in the past year? 0 0 0 0 0  Number falls in past yr: 0 0 0 0 0  Injury with Fall? 0 0 0 0 0  Risk for fall due to : No Fall Risks No Fall Risks No Fall Risks No Fall Risks Impaired mobility  Follow up     Education provided    MEDICARE RISK AT HOME:    TIMED UP AND GO:  Was the test performed?  No    Cognitive Function:    03/24/2022    3:38 PM 10/26/2020    9:09 AM 10/11/2019   10:38 AM  MMSE - Mini Mental State Exam  Orientation to time 5 5 5   Orientation to Place 5 5 5   Registration 3 3 3   Attention/ Calculation 5 5 0  Recall 3 3 2   Language- name 2 objects 2 2 2   Language- repeat 1 1 1   Language- follow 3 step command 3 3 1   Language- read & follow direction 1 1 1   Write a sentence 1 1 1   Copy design 1 1 1   Total score 30 30 22         03/24/2022    3:38 PM  6CIT Screen  What Year? 0 points  What month? 0 points  What time? 0 points  Count back from 20 0 points  Repeat phrase 0 points    Immunizations Immunization History  Administered Date(s) Administered   Influenza,inj,Quad PF,6+ Mos 02/19/2017, 12/14/2018, 01/09/2021, 02/10/2022   Influenza-Unspecified 02/04/2020   PFIZER(Purple Top)SARS-COV-2 Vaccination 07/01/2019, 07/22/2019, 02/25/2020   Pneumococcal Conjugate-13 10/11/2019   Pneumococcal Polysaccharide-23 07/09/2021   Tdap 06/10/2017   Zoster Recombinant(Shingrix) 04/10/2020, 06/11/2020    TDAP status: Up to date  Flu Vaccine status: Due, Education has been provided regarding the importance of this vaccine. Advised may receive this vaccine at local pharmacy or Health Dept. Aware to provide a copy of the vaccination record if obtained from  local pharmacy or Health Dept. Verbalized acceptance and understanding.  Pneumococcal vaccine status: Up to date  Covid-19 vaccine status: Information provided on how to obtain vaccines.   Qualifies for Shingles Vaccine? Yes   Zostavax completed No   Shingrix Completed?: Yes  Screening Tests Health Maintenance  Topic Date Due   Diabetic kidney evaluation - Urine ACR  Never done   MAMMOGRAM  01/04/2022   INFLUENZA VACCINE  11/13/2022   COVID-19 Vaccine (4 - 2023-24 season) 12/14/2022   Medicare Annual Wellness (AWV)  03/25/2023   Diabetic kidney evaluation - eGFR measurement  09/01/2023   DTaP/Tdap/Td (2 - Td or Tdap) 06/11/2027   Colonoscopy  02/27/2029   Pneumonia Vaccine 89+ Years old  Completed   DEXA SCAN  Completed   Hepatitis C Screening  Completed   Zoster Vaccines- Shingrix  Completed   HPV VACCINES  Aged Out    Health Maintenance  Health Maintenance Due  Topic Date Due   Diabetic kidney evaluation - Urine ACR  Never done   MAMMOGRAM  01/04/2022   INFLUENZA VACCINE  11/13/2022   COVID-19 Vaccine (4 - 2023-24 season) 12/14/2022    Colorectal cancer screening: Type of screening: Colonoscopy. Completed 02/28/19. Repeat every 10 years  {Mammogram status:[id number]}  Bone Density status: Completed 01/05/20. Results reflect: Bone density results: OSTEOPENIA. Repeat every 2  years.  Lung Cancer Screening: (Low Dose CT Chest recommended if Age 55-80 years, 20 pack-year currently smoking OR have quit w/in 15years.) does not qualify.   Lung Cancer Screening Referral: n/a  Additional Screening:  Hepatitis C Screening: does qualify; Completed 01/16/16  Vision Screening: Recommended annual ophthalmology exams for early detection of glaucoma and other disorders of the eye. Is the patient up to date with their annual eye exam?  No  Who is the provider or what is the name of the office in which the patient attends annual eye exams? *** If pt is not established with a  provider, would they like to be referred to a provider to establish care? {YES/NO:21197}.   Dental Screening: Recommended annual dental exams for proper oral hygiene  Community Resource Referral / Chronic Care Management: CRR required this visit?  {YES/NO:21197}  CCM required this visit?  {CCM Required choices:[phone]}     Plan:     I have personally reviewed and noted the following in the patient's chart:   Medical and social history Use of alcohol, tobacco or illicit drugs  Current medications and supplements including opioid prescriptions. {Opioid [phone]} Functional ability and status Nutritional status Physical activity Advanced directives List of other physicians Hospitalizations, surgeries, and ER visits in previous 12 months Vitals Screenings to include cognitive, depression, and falls Referrals and appointments  In addition, I have reviewed and discussed with patient certain preventive protocols, quality metrics, and best practice recommendations. A written personalized care plan for preventive services as well as general preventive health recommendations were provided to patient.     Kandis Fantasia [place], [place]   4/0/9811   After Visit Summary: {CHL AMB AWV After Visit Summary:[phone]}  Nurse Notes: ***

## 2022-12-15 NOTE — Patient Instructions (Incomplete)
Danielle Berger , Thank you for taking time to come for your Medicare Wellness Visit. I appreciate your ongoing commitment to your health goals. Please review the following plan we discussed and let me know if I can assist you in the future.   Referrals/Orders/Follow-Ups/Clinician Recommendations: Aim for 30 minutes of exercise or brisk walking, 6-8 glasses of water, and 5 servings of fruits and vegetables each day.  You have an order for:  []   2D Mammogram  [x]   3D Mammogram  []   Bone Density     Please call for appointment:   [facility] [address] [place], [place] [address] [phone]  Make sure to wear two-piece clothing.  No lotions, powders, or deodorants the day of the appointment. Make sure to bring picture ID and insurance card.  Bring list of medications you are currently taking including any supplements.   Schedule your [place]a screening mammogram through MyChart!   Log into your MyChart account.  Go to 'Visit' (or 'Appointments' if on mobile App) --> Schedule an Appointment  Under 'Select a Reason for Visit' choose the Mammogram Screening option.  Complete the pre-visit questions and select the time and place that best fits your schedule.    This is a list of the screening recommended for you and due dates:  Health Maintenance  Topic Date Due   Yearly kidney health urinalysis for diabetes  Never done   Mammogram  01/04/2022   Flu Shot  11/13/2022   COVID-19 Vaccine (4 - 2023-24 season) 12/14/2022   Yearly kidney function blood test for diabetes  09/01/2023   Medicare Annual Wellness Visit  12/16/2023   DTaP/Tdap/Td vaccine (2 - Td or Tdap) 06/11/2027   Colon Cancer Screening  02/27/2029   Pneumonia Vaccine  Completed   DEXA scan (bone density measurement)  Completed   Hepatitis C Screening  Completed   Zoster (Shingles) Vaccine  Completed   HPV Vaccine  Aged Out    Advanced directives: (ACP Link)Information on Advanced  Care Planning can be found at [facility] Advance Health Care Directives Advance Health Care Directives ([email or web address]   Next Medicare Annual Wellness Visit scheduled for next year: Yes

## 2022-12-16 ENCOUNTER — Ambulatory Visit: Payer: Medicare Other | Attending: Family Medicine

## 2022-12-16 VITALS — Ht 62.0 in | Wt 155.0 lb

## 2022-12-16 DIAGNOSIS — Z0000 Encounter for general adult medical examination without abnormal findings: Secondary | ICD-10-CM

## 2023-01-27 ENCOUNTER — Other Ambulatory Visit: Payer: Self-pay | Admitting: Family Medicine

## 2023-01-27 DIAGNOSIS — M25559 Pain in unspecified hip: Secondary | ICD-10-CM

## 2023-02-16 ENCOUNTER — Telehealth: Payer: Self-pay

## 2023-02-16 NOTE — Telephone Encounter (Signed)
Copied from CRM [id number]. Topic: General - Other >> Feb 16, 2023 11:47 AM Franchot Heidelberg wrote: Reason for CRM: Thomasenia Sales from [facility] called requesting a manufacturer change on the levothyroxine. Please advise   Best contact: [phone]  Reference: [id number]

## 2023-02-17 NOTE — Telephone Encounter (Signed)
Routing to PCP for review.

## 2023-02-17 NOTE — Telephone Encounter (Signed)
JP form [facility] called about geti=ting manufacturer change from levothyroxine to lupin. I let her know of 48-72 hr turnaround

## 2023-02-17 NOTE — Telephone Encounter (Signed)
Franky Macho, can you please look into this?  If this is just a basic manufacturer change okay to give verbal to proceed and she can be scheduled for repeat thyroid panel.  Thank you.

## 2023-03-04 ENCOUNTER — Ambulatory Visit: Payer: Medicare Other | Attending: Family Medicine | Admitting: Family Medicine

## 2023-03-04 ENCOUNTER — Encounter: Payer: Self-pay | Admitting: Family Medicine

## 2023-03-04 VITALS — BP 118/70 | HR 61 | Ht 62.0 in | Wt 154.4 lb

## 2023-03-04 DIAGNOSIS — E1122 Type 2 diabetes mellitus with diabetic chronic kidney disease: Secondary | ICD-10-CM | POA: Diagnosis not present

## 2023-03-04 DIAGNOSIS — I129 Hypertensive chronic kidney disease with stage 1 through stage 4 chronic kidney disease, or unspecified chronic kidney disease: Secondary | ICD-10-CM | POA: Diagnosis not present

## 2023-03-04 DIAGNOSIS — G4709 Other insomnia: Secondary | ICD-10-CM | POA: Diagnosis not present

## 2023-03-04 DIAGNOSIS — M25559 Pain in unspecified hip: Secondary | ICD-10-CM

## 2023-03-04 DIAGNOSIS — R7303 Prediabetes: Secondary | ICD-10-CM | POA: Diagnosis not present

## 2023-03-04 DIAGNOSIS — N1832 Chronic kidney disease, stage 3b: Secondary | ICD-10-CM

## 2023-03-04 DIAGNOSIS — Z7984 Long term (current) use of oral hypoglycemic drugs: Secondary | ICD-10-CM | POA: Diagnosis not present

## 2023-03-04 DIAGNOSIS — E038 Other specified hypothyroidism: Secondary | ICD-10-CM

## 2023-03-04 DIAGNOSIS — E782 Mixed hyperlipidemia: Secondary | ICD-10-CM

## 2023-03-04 DIAGNOSIS — Z1231 Encounter for screening mammogram for malignant neoplasm of breast: Secondary | ICD-10-CM | POA: Diagnosis not present

## 2023-03-04 DIAGNOSIS — F17210 Nicotine dependence, cigarettes, uncomplicated: Secondary | ICD-10-CM

## 2023-03-04 MED ORDER — ATORVASTATIN CALCIUM 80 MG PO TABS: 80.0000 mg | ORAL_TABLET | Freq: Every day | ORAL | 3 refills | Status: DC

## 2023-03-04 MED ORDER — CARVEDILOL 12.5 MG PO TABS: ORAL_TABLET | ORAL | 3 refills | Status: DC

## 2023-03-04 MED ORDER — GABAPENTIN 300 MG PO CAPS: 300.0000 mg | ORAL_CAPSULE | Freq: Two times a day (BID) | ORAL | 2 refills | Status: DC

## 2023-03-04 MED ORDER — TRAZODONE HCL 50 MG PO TABS: 50.0000 mg | ORAL_TABLET | Freq: Every evening | ORAL | 3 refills | Status: DC | PRN

## 2023-03-04 MED ORDER — LISINOPRIL-HYDROCHLOROTHIAZIDE 10-12.5 MG PO TABS: 1.0000 | ORAL_TABLET | Freq: Every day | ORAL | 3 refills | Status: DC

## 2023-03-04 MED ORDER — BUPROPION HCL ER (XL) 150 MG PO TB24: 150.0000 mg | ORAL_TABLET | Freq: Every day | ORAL | 1 refills | Status: AC

## 2023-03-04 NOTE — Progress Notes (Signed)
Subjective:  Patient ID: Danielle Berger, female    DOB: [birth year]  Age: 67 y.o. MRN: [id number]  CC: Medical Management of Chronic Issues   HPI Lori Ambrosini is a 67 y.o. year old female with a history of Tobacco abuse (>49 pack year), hypertension, hyperlipidemia, hypothyroidism, stage III CKD.  Insomnia who comes into the clinic for a follow-up visit.   Interval History: Discussed the use of AI scribe software for clinical note transcription with the patient, who gave verbal consent to proceed.  She presents for a routine follow-up. She reports adherence to her thyroid medication and has no new concerns. Her last TSH was normal.  She takes her medication in the morning on an empty stomach, followed by her other medications an hour later. She also drinks coffee in the morning.  The patient was previously identified as prediabetic with an A1c of 6.1 last year. She also has a history of insomnia, for which she takes trazodone as needed, not every night. She continues to smoke about half a pack of cigarettes a day but smoked greater than that previously to a total of 49-pack-year history and has agreed to try Wellbutrin to help decrease her craving for cigarettes.  The patient received her flu shot in October and is due for a mammogram, which has been difficult to schedule due to caring for her husband and working three days a week.        Past Medical History:  Diagnosis Date   Chest pain 07/17/2015   Chronic renal disease    AS A CHILD   Esophageal reflux    PT. DENIES 02/15/19   Headache    HLD (hyperlipidemia)    Hypertension    Hypothyroidism    Tobacco abuse 07/17/2015    Past Surgical History:  Procedure Laterality Date   Brain Tumor removed      when patient was in the 6th grade   KIDNEY SURGERY     when patient was int he 8th grade    Family History  Adopted: Yes    Social History   Socioeconomic History   Marital status: Married    Spouse  name: Not on file   Number of children: 1   Years of education: Not on file   Highest education level: 12th grade  Occupational History   Not on file  Tobacco Use   Smoking status: Every Day    Current packs/day: 0.50    Types: Cigarettes   Smokeless tobacco: Never  Vaping Use   Vaping status: Never Used  Substance and Sexual Activity   Alcohol use: Yes    Alcohol/week: 2.0 standard drinks of alcohol    Types: 1 Glasses of wine, 1 Shots of liquor per week    Comment: occ   Drug use: No   Sexual activity: Yes    Birth control/protection: None  Other Topics Concern   Not on file  Social History Narrative   Not on file   Social Determinants of Health   Financial Resource Strain: Low Risk  (12/16/2022)   Overall Financial Resource Strain (CARDIA)    Difficulty of Paying Living Expenses: Not hard at all  Food Insecurity: No Food Insecurity (12/16/2022)   Hunger Vital Sign    Worried About Running Out of Food in the Last Year: Never true    Ran Out of Food in the Last Year: Never true  Transportation Needs: No Transportation Needs (12/16/2022)   PRAPARE - Transportation  Lack of Transportation (Medical): No    Lack of Transportation (Non-Medical): No  Physical Activity: Inactive (12/16/2022)   Exercise Vital Sign    Days of Exercise per Week: 0 days    Minutes of Exercise per Session: 0 min  Stress: No Stress Concern Present (12/16/2022)   [employer] of Occupational Health - Occupational Stress Questionnaire    Feeling of Stress : Not at all  Social Connections: Moderately Isolated (12/16/2022)   Social Connection and Isolation Panel [NHANES]    Frequency of Communication with Friends and Family: More than three times a week    Frequency of Social Gatherings with Friends and Family: Three times a week    Attends Religious Services: Never    Active Member of Clubs or Organizations: No    Attends Banker Meetings: Never    Marital Status: Married    Allergies   Allergen Reactions   Codeine Nausea And Vomiting   Penicillins Rash    Outpatient Medications Prior to Visit  Medication Sig Dispense Refill   levothyroxine (SYNTHROID) 75 MCG tablet TAKE 1 TABLET BY MOUTH DAILY  BEFORE BREAKFAST 100 tablet 1   lidocaine (LIDODERM) 5 % Place 1 patch onto the skin daily. Remove & Discard patch within 12 hours or as directed by MD 30 patch 3   meloxicam (MOBIC) 7.5 MG tablet Take 1 tablet (7.5 mg total) by mouth daily. 30 tablet 1   Omega-3 Fatty Acids (FISH OIL PO) Take by mouth.     atorvastatin (LIPITOR) 80 MG tablet Take 1 tablet (80 mg total) by mouth daily. 90 tablet 3   carvedilol (COREG) 12.5 MG tablet TAKE 1 TABLET BY MOUTH  TWICE DAILY WITH A MEAL 180 tablet 3   gabapentin (NEURONTIN) 300 MG capsule TAKE 1 CAPSULE BY MOUTH TWICE  DAILY 200 capsule 2   lisinopril-hydrochlorothiazide (ZESTORETIC) 10-12.5 MG tablet Take 1 tablet by mouth daily. 90 tablet 3   traZODone (DESYREL) 50 MG tablet Take 1 tablet (50 mg total) by mouth at bedtime as needed for sleep. 90 tablet 3   aspirin EC 81 MG EC tablet Take 1 tablet (81 mg total) by mouth daily. (Patient not taking: Reported on 03/04/2023)     No facility-administered medications prior to visit.     ROS Review of Systems  Constitutional:  Negative for activity change and appetite change.  HENT:  Negative for sinus pressure and sore throat.   Respiratory:  Negative for chest tightness, shortness of breath and wheezing.   Cardiovascular:  Negative for chest pain and palpitations.  Gastrointestinal:  Negative for abdominal distention, abdominal pain and constipation.  Genitourinary: Negative.   Musculoskeletal: Negative.   Psychiatric/Behavioral:  Negative for behavioral problems and dysphoric mood.     Objective:  BP 118/70   Pulse 61   Ht 5\' 2"  (1.575 m)   Wt 154 lb 6.4 oz (70 kg)   SpO2 97%   BMI 28.24 kg/m      03/04/2023    8:56 AM 12/16/2022    8:52 AM 09/01/2022    8:42 AM   BP/Weight  Systolic BP 118 -- 131  Diastolic BP 70 -- 71  Wt. (Lbs) 154.4 155 155.6  BMI 28.24 kg/m2 28.35 kg/m2 28.46 kg/m2      Physical Exam Constitutional:      Appearance: She is well-developed.  Cardiovascular:     Rate and Rhythm: Normal rate.     Heart sounds: Normal heart sounds. No murmur heard. Pulmonary:  Effort: Pulmonary effort is normal.     Breath sounds: Normal breath sounds. No wheezing or rales.  Chest:     Chest wall: No tenderness.  Abdominal:     General: Bowel sounds are normal. There is no distension.     Palpations: Abdomen is soft. There is no mass.     Tenderness: There is no abdominal tenderness.  Musculoskeletal:        General: Normal range of motion.     Right lower leg: No edema.     Left lower leg: No edema.  Neurological:     Mental Status: She is alert and oriented to person, place, and time.  Psychiatric:        Mood and Affect: Mood normal.        Latest Ref Rng & Units 09/01/2022    9:14 AM 02/28/2022    9:30 AM 07/09/2021    9:19 AM  CMP  Glucose 70 - 99 mg/dL 081  448  185   BUN 8 - 27 mg/dL 21  23  22    Creatinine 0.57 - 1.00 mg/dL 6.31  4.97  0.26   Sodium 134 - 144 mmol/L 142  143  143   Potassium 3.5 - 5.2 mmol/L 4.6  4.7  4.8   Chloride 96 - 106 mmol/L 104  102  101   CO2 20 - 29 mmol/L 23  26  26    Calcium 8.7 - 10.3 mg/dL 9.3  9.6  9.6   Total Protein 6.0 - 8.5 g/dL 6.6  6.8  6.6   Total Bilirubin 0.0 - 1.2 mg/dL 0.4  0.3  0.3   Alkaline Phos 44 - 121 IU/L 107  104  99   AST 0 - 40 IU/L 22  20  22    ALT 0 - 32 IU/L 20  19  19      Lipid Panel     Component Value Date/Time   CHOL 173 09/01/2022 0914   TRIG 159 (H) 09/01/2022 0914   HDL 45 09/01/2022 0914   CHOLHDL 4.8 (H) 01/09/2021 0906   CHOLHDL 3.7 05/07/2016 0946   VLDL 34 (H) 05/07/2016 0946   LDLCALC 100 (H) 09/01/2022 0914    CBC    Component Value Date/Time   WBC 5.2 09/01/2022 0914   WBC 7.6 07/17/2015 1011   RBC 4.39 09/01/2022 0914    RBC 5.08 07/17/2015 1011   HGB 13.0 09/01/2022 0914   HCT 39.9 09/01/2022 0914   PLT 239 09/01/2022 0914   MCV 91 09/01/2022 0914   MCH 29.6 09/01/2022 0914   MCH 28.3 07/17/2015 1011   MCHC 32.6 09/01/2022 0914   MCHC 32.0 07/17/2015 1011   RDW 12.4 09/01/2022 0914   LYMPHSABS 1.4 09/01/2022 0914   MONOABS 0.5 07/17/2015 1011   EOSABS 0.0 09/01/2022 0914   BASOSABS 0.1 09/01/2022 0914    Lab Results  Component Value Date   HGBA1C 6.1 (H) 02/28/2022    Lab Results  Component Value Date   TSH 2.170 10/17/2022    Assessment & Plan:      Hypothyroidism Stable on current dose of levothyroxine. Last TSH was normal in July. Discussed the importance of consistent dosing and potential interactions with food and other medications. -Check TSH today to ensure continued stability on current dose.  Prediabetes Last checked one year ago was 6.1.  No new symptoms reported. -Check HbA1c today to monitor for progression to diabetes. -Continue lifestyle modification  Hypertension Blood pressure well controlled  on current regimen of carvedilol, lisinopril, and hydrochlorothiazide. -Continue current medications. -Counseled on blood pressure goal of less than 130/80, low-sodium, DASH diet, medication compliance, 150 minutes of moderate intensity exercise per week. Discussed medication compliance, adverse effects.   Hyperlipidemia Last cholesterol check in May was normal. Patient is on Lipitor. -No need to repeat cholesterol check today. Continue Lipitor. -Low-cholesterol diet  Insomnia Trazodone used as needed. Patient does not take it every night. -Continue current regimen.  Tobacco Use/49-pack-year history of smoking Patient reports smoking half a pack a day. Expressed interest in quitting. -Prescribe Wellbutrin for smoking cessation.  General Health Maintenance -Flu shot received in October. -Order mammogram and provide contact information for scheduling.           Meds ordered this encounter  Medications   buPROPion (WELLBUTRIN XL) 150 MG 24 hr tablet    Sig: Take 1 tablet (150 mg total) by mouth daily. For smoking cessation    Dispense:  90 tablet    Refill:  1   atorvastatin (LIPITOR) 80 MG tablet    Sig: Take 1 tablet (80 mg total) by mouth daily.    Dispense:  90 tablet    Refill:  3   carvedilol (COREG) 12.5 MG tablet    Sig: TAKE 1 TABLET BY MOUTH  TWICE DAILY WITH A MEAL    Dispense:  180 tablet    Refill:  3   gabapentin (NEURONTIN) 300 MG capsule    Sig: Take 1 capsule (300 mg total) by mouth 2 (two) times daily.    Dispense:  200 capsule    Refill:  2    Please send a replace/new response with 100-Day Supply if appropriate to maximize member benefit. Requesting 1 year supply.   lisinopril-hydrochlorothiazide (ZESTORETIC) 10-12.5 MG tablet    Sig: Take 1 tablet by mouth daily.    Dispense:  90 tablet    Refill:  3    Requesting 1 year supply   traZODone (DESYREL) 50 MG tablet    Sig: Take 1 tablet (50 mg total) by mouth at bedtime as needed for sleep.    Dispense:  90 tablet    Refill:  3    Follow-up: Return in about 6 months (around 09/01/2023) for Chronic medical conditions.       Hoy Register, MD, FAAFP. [facility] [place], [place] [phone]   03/04/2023, 10:14 AM

## 2023-03-04 NOTE — Patient Instructions (Signed)
VISIT SUMMARY:  Today, we reviewed your ongoing health conditions and made sure your treatments are on track. We discussed your thyroid levels, prediabetes, blood pressure, cholesterol, insomnia, and smoking habits. We also talked about your general health maintenance, including your flu shot and the need for a mammogram.  YOUR PLAN:  -HYPOTHYROIDISM: Hypothyroidism is a condition where your thyroid gland does not produce enough thyroid hormone. Your current dose of levothyroxine seems to be working well, and we will check your TSH levels today to ensure they remain stable.  -PREDIABETES: Prediabetes means your blood sugar levels are higher than normal but not high enough to be classified as diabetes. We will check your HbA1c levels today to monitor for any changes.  -HYPERTENSION: Hypertension, or high blood pressure, is well controlled with your current medications. Continue taking carvedilol, lisinopril, and hydrochlorothiazide as prescribed.  -HYPERLIPIDEMIA: Hyperlipidemia is having high levels of fats in your blood. Your last cholesterol check was normal, so you should continue taking Lipitor as prescribed.  -INSOMNIA: Insomnia is difficulty falling or staying asleep. You are managing this with trazodone as needed, and you should continue with this regimen.  -TOBACCO USE: You are currently smoking half a pack of cigarettes a day and have expressed interest in quitting. We will prescribe Wellbutrin to help you reduce your cravings.  I also ordered a lung CT to screen for lung cancer due to history of smoking.  -GENERAL HEALTH MAINTENANCE: You received your flu shot in October. You are due for a mammogram, and we will provide you with the contact information to schedule this important screening.  INSTRUCTIONS:  Please follow up with the lab today to check your TSH and HbA1c levels. Continue taking your current medications as prescribed. Schedule your mammogram using the contact information  provided. If you have any questions or concerns, please do not hesitate to contact our office.

## 2023-03-05 ENCOUNTER — Other Ambulatory Visit: Payer: Self-pay | Admitting: Family Medicine

## 2023-03-05 DIAGNOSIS — E038 Other specified hypothyroidism: Secondary | ICD-10-CM

## 2023-03-05 LAB — BASIC METABOLIC PANEL
BUN/Creatinine Ratio: 18 (ref 12–28)
BUN: 28 mg/dL — ABNORMAL HIGH (ref 8–27)
CO2: 23 mmol/L (ref 20–29)
Calcium: 9.5 mg/dL (ref 8.7–10.3)
Chloride: 101 mmol/L (ref 96–106)
Creatinine, Ser: 1.6 mg/dL — ABNORMAL HIGH (ref 0.57–1.00)
Glucose: 102 mg/dL — ABNORMAL HIGH (ref 70–99)
Potassium: 5.1 mmol/L (ref 3.5–5.2)
Sodium: 139 mmol/L (ref 134–144)
eGFR: 35 mL/min/{1.73_m2} — ABNORMAL LOW (ref >59)

## 2023-03-05 LAB — HEMOGLOBIN A1C
Est. average glucose Bld gHb Est-mCnc: 131 mg/dL
Hgb A1c MFr Bld: 6.2 % — ABNORMAL HIGH (ref 4.8–5.6)

## 2023-03-05 LAB — T3: T3, Total: 81 ng/dL (ref 71–180)

## 2023-03-05 LAB — T4, FREE: Free T4: 1.47 ng/dL (ref 0.82–1.77)

## 2023-03-05 LAB — TSH: TSH: 3.98 u[IU]/mL (ref 0.450–4.500)

## 2023-03-05 MED ORDER — LEVOTHYROXINE SODIUM 75 MCG PO TABS: 75.0000 ug | ORAL_TABLET | Freq: Every day | ORAL | 1 refills | Status: DC

## 2023-03-17 ENCOUNTER — Telehealth: Payer: Self-pay

## 2023-03-17 NOTE — Telephone Encounter (Signed)
Pt given lab results per notes of Dr. Alvis Lemmings on 03/17/23. Pt verbalized understanding.

## 2023-03-19 ENCOUNTER — Ambulatory Visit
Admission: RE | Admit: 2023-03-19 | Discharge: 2023-03-19 | Disposition: A | Discharge status: Home / Self Care | Payer: Medicare Other | Source: Ambulatory Visit | Attending: Family Medicine | Admitting: Family Medicine

## 2023-03-19 DIAGNOSIS — Z87891 Personal history of nicotine dependence: Secondary | ICD-10-CM | POA: Diagnosis not present

## 2023-03-19 DIAGNOSIS — F17210 Nicotine dependence, cigarettes, uncomplicated: Secondary | ICD-10-CM

## 2023-03-26 ENCOUNTER — Telehealth: Payer: Self-pay | Admitting: Family Medicine

## 2023-03-26 NOTE — Telephone Encounter (Signed)
Noted  

## 2023-03-26 NOTE — Telephone Encounter (Signed)
Diane the nurse called stated she visited patient today for her [employer] Screening and patient has moderate bilateral PAD.

## 2023-04-03 ENCOUNTER — Telehealth: Payer: Self-pay

## 2023-04-03 DIAGNOSIS — R911 Solitary pulmonary nodule: Secondary | ICD-10-CM

## 2023-04-03 NOTE — Telephone Encounter (Signed)
I called her but was unable to reach her. Please inform her I referred her to Pulmonary due to a suspicious lung nodule.

## 2023-04-03 NOTE — Addendum Note (Signed)
Addended by: Hoy Register on: 04/03/2023 06:18 PM   Modules accepted: Orders

## 2023-04-03 NOTE — Telephone Encounter (Signed)
Diane from [facility] calling to verify that the results from the CT Chest lung cancer screening results were visible in the patient's chart. Confirmed that the results are available and will notify PCP that they are ready for review.

## 2023-04-06 ENCOUNTER — Ambulatory Visit: Payer: Self-pay | Admitting: *Deleted

## 2023-04-06 NOTE — Telephone Encounter (Signed)
Spoke with patient . Advised that  [facility] @ [address] [place] [address] Ph# [phone]  Have been trying to contact her. Patient voiced that she will give them a call.

## 2023-04-06 NOTE — Telephone Encounter (Signed)
  Chief Complaint: Given the message from Dr. Alvis Lemmings dated 04/03/2023 at 6:18 PM Symptoms: N/A Frequency: N/A Pertinent Negatives: Patient denies N/A Disposition: [] ED /[] Urgent Care (no appt availability in office) / [] Appointment(In office/virtual)/ []  [facility]/ [x] Home Care/ [] Refused Recommended Disposition /[]  Mobile Bus/ []  Follow-up with PCP Additional Notes: Pt called in and was given the message from Dr. Alvis Lemmings regarding the pulmonary referral.

## 2023-04-06 NOTE — Telephone Encounter (Signed)
Reason for Disposition  [1] Follow-up call to recent contact AND [2] information only call, no triage required  Answer Assessment - Initial Assessment Questions 1. REASON FOR CALL or QUESTION: "What is your reason for calling today?" or "How can I best help you?" or "What question do you have that I can help answer?"     Pt given message per notes of Dr. Alvis Lemmings on 04/03/2023 at 6:18 PM.   Pt verbalized understanding.  Protocols used: Information Only Call - No Triage-A-AH

## 2023-04-14 ENCOUNTER — Ambulatory Visit
Admission: RE | Admit: 2023-04-14 | Discharge: 2023-04-14 | Disposition: A | Discharge status: Home / Self Care | Payer: Medicare Other | Source: Ambulatory Visit | Attending: Family Medicine | Admitting: Family Medicine

## 2023-04-14 DIAGNOSIS — Z1231 Encounter for screening mammogram for malignant neoplasm of breast: Secondary | ICD-10-CM

## 2023-04-21 ENCOUNTER — Telehealth: Payer: Self-pay | Admitting: Family Medicine

## 2023-04-21 NOTE — Telephone Encounter (Signed)
 Copied from CRM [id number]. Topic: General - Other >> Apr 20, 2023  2:32 PM Ja-Kwan M wrote: Reason for CRM: Pt called for imaging results from 04/14/23. Pt requests call back to go over the results. Cb# [phone]

## 2023-04-21 NOTE — Telephone Encounter (Signed)
 Pt was called and informed of Mammogram results.

## 2023-04-23 ENCOUNTER — Telehealth: Payer: Self-pay | Admitting: Pulmonary Disease

## 2023-04-23 NOTE — Telephone Encounter (Signed)
 PT's Dr. Baxter Flattery rep calling to see if we can work her in sooner. He feels the nodule is cancerous. Right now her appt is 2/6 @ 10:00. CT can be viewed in Epic. Please advise.    Dr. Baxter Flattery [phone]

## 2023-04-24 ENCOUNTER — Telehealth: Payer: Self-pay

## 2023-04-24 NOTE — Telephone Encounter (Signed)
 Copied from CRM [id number]. Topic: Referral - Status >> Apr 23, 2023  4:48 PM Macon Large wrote: Reason for CRM: Pt called to report that she is ok with going to [facility] to the lung doctor. Pt requests that the referral be sent to the [facility] location.

## 2023-04-24 NOTE — Telephone Encounter (Signed)
 Danielle Berger has 04/29/23 nodule opening for 9 am

## 2023-04-27 NOTE — Telephone Encounter (Signed)
 FYI

## 2023-04-29 ENCOUNTER — Encounter: Payer: Self-pay | Admitting: Pulmonary Disease

## 2023-04-29 ENCOUNTER — Ambulatory Visit: Payer: Medicare Other | Admitting: Pulmonary Disease

## 2023-04-29 VITALS — BP 138/82 | HR 54 | Ht 62.0 in | Wt 161.0 lb

## 2023-04-29 DIAGNOSIS — R911 Solitary pulmonary nodule: Secondary | ICD-10-CM

## 2023-04-29 DIAGNOSIS — Z720 Tobacco use: Secondary | ICD-10-CM

## 2023-04-29 NOTE — Telephone Encounter (Signed)
 Noted.

## 2023-04-29 NOTE — Progress Notes (Signed)
 Synopsis: Referred in January 2025 for pulmonary nodule by Joaquin Mulberry, MD  Subjective:   PATIENT ID: Danielle Berger GENDER: female DOB: 1955-05-19, MRN: [id number]  Chief Complaint  Patient presents with   Consult    Ct 12/5     This is a 68 year old female longstanding history of tobacco abuse, hypertension, hypothyroidism.  Patient had lung cancer screening CT completed by primary care provider which found a new left lower lobe 6 mm pulmonary nodule read as a lung RADS 4A referred here for evaluation of pulmonary nodule.    Past Medical History:  Diagnosis Date   Chest pain 07/17/2015   Chronic renal disease    AS A CHILD   Esophageal reflux    PT. DENIES 02/15/19   Headache    HLD (hyperlipidemia)    Hypertension    Hypothyroidism    Tobacco abuse 07/17/2015     Family History  Adopted: Yes     Past Surgical History:  Procedure Laterality Date   Brain Tumor removed      when patient was in the 6th grade   KIDNEY SURGERY     when patient was int he 8th grade    Social History   Socioeconomic History   Marital status: Married    Spouse name: Not on file   Number of children: 1   Years of education: Not on file   Highest education level: 12th grade  Occupational History   Not on file  Tobacco Use   Smoking status: Every Day    Current packs/day: 0.50    Types: Cigarettes   Smokeless tobacco: Never  Vaping Use   Vaping status: Never Used  Substance and Sexual Activity   Alcohol use: Yes    Alcohol/week: 2.0 standard drinks of alcohol    Types: 1 Glasses of wine, 1 Shots of liquor per week    Comment: occ   Drug use: No   Sexual activity: Yes    Birth control/protection: None  Other Topics Concern   Not on file  Social History Narrative   Not on file   Social Drivers of Health   Financial Resource Strain: Low Risk  (12/16/2022)   Overall Financial Resource Strain (CARDIA)    Difficulty of Paying Living Expenses: Not hard at all  Food  Insecurity: No Food Insecurity (12/16/2022)   Hunger Vital Sign    Worried About Running Out of Food in the Last Year: Never true    Ran Out of Food in the Last Year: Never true  Transportation Needs: No Transportation Needs (12/16/2022)   PRAPARE - Administrator, Civil Service (Medical): No    Lack of Transportation (Non-Medical): No  Physical Activity: Inactive (12/16/2022)   Exercise Vital Sign    Days of Exercise per Week: 0 days    Minutes of Exercise per Session: 0 min  Stress: No Stress Concern Present (12/16/2022)   [employer] of Occupational Health - Occupational Stress Questionnaire    Feeling of Stress : Not at all  Social Connections: Moderately Isolated (12/16/2022)   Social Connection and Isolation Panel [NHANES]    Frequency of Communication with Friends and Family: More than three times a week    Frequency of Social Gatherings with Friends and Family: Three times a week    Attends Religious Services: Never    Active Member of Clubs or Organizations: No    Attends Banker Meetings: Never    Marital Status: Married  Intimate Partner Violence: Not At Risk (12/16/2022)   Humiliation, Afraid, Rape, and Kick questionnaire    Fear of Current or Ex-Partner: No    Emotionally Abused: No    Physically Abused: No    Sexually Abused: No     Allergies  Allergen Reactions   Codeine Nausea And Vomiting   Penicillins Rash     Outpatient Medications Prior to Visit  Medication Sig Dispense Refill   atorvastatin  (LIPITOR) 80 MG tablet Take 1 tablet (80 mg total) by mouth daily. 90 tablet 3   buPROPion  (WELLBUTRIN  XL) 150 MG 24 hr tablet Take 1 tablet (150 mg total) by mouth daily. For smoking cessation 90 tablet 1   carvedilol  (COREG ) 12.5 MG tablet TAKE 1 TABLET BY MOUTH  TWICE DAILY WITH A MEAL 180 tablet 3   levothyroxine  (SYNTHROID ) 75 MCG tablet Take 1 tablet (75 mcg total) by mouth daily before breakfast. 100 tablet 1   Omega-3 Fatty Acids (FISH OIL  PO) Take by mouth.     traZODone  (DESYREL ) 50 MG tablet Take 1 tablet (50 mg total) by mouth at bedtime as needed for sleep. 90 tablet 3   aspirin  EC 81 MG EC tablet Take 1 tablet (81 mg total) by mouth daily. (Patient not taking: Reported on 04/29/2023)     gabapentin  (NEURONTIN ) 300 MG capsule Take 1 capsule (300 mg total) by mouth 2 (two) times daily. (Patient not taking: Reported on 04/29/2023) 200 capsule 2   lidocaine  (LIDODERM ) 5 % Place 1 patch onto the skin daily. Remove & Discard patch within 12 hours or as directed by MD (Patient not taking: Reported on 04/29/2023) 30 patch 3   lisinopril -hydrochlorothiazide  (ZESTORETIC ) 10-12.5 MG tablet Take 1 tablet by mouth daily. (Patient not taking: Reported on 04/29/2023) 90 tablet 3   meloxicam  (MOBIC ) 7.5 MG tablet Take 1 tablet (7.5 mg total) by mouth daily. (Patient not taking: Reported on 04/29/2023) 30 tablet 1   No facility-administered medications prior to visit.    Review of Systems  Constitutional:  Negative for chills, fever, malaise/fatigue and weight loss.  HENT:  Negative for hearing loss, sore throat and tinnitus.   Eyes:  Negative for blurred vision and double vision.  Respiratory:  Positive for cough and shortness of breath. Negative for hemoptysis, sputum production, wheezing and stridor.   Cardiovascular:  Negative for chest pain, palpitations, orthopnea, leg swelling and PND.  Gastrointestinal:  Negative for abdominal pain, constipation, diarrhea, heartburn, nausea and vomiting.  Genitourinary:  Negative for dysuria, hematuria and urgency.  Musculoskeletal:  Negative for joint pain and myalgias.  Skin:  Negative for itching and rash.  Neurological:  Negative for dizziness, tingling, weakness and headaches.  Endo/Heme/Allergies:  Negative for environmental allergies. Does not bruise/bleed easily.  Psychiatric/Behavioral:  Negative for depression. The patient is not nervous/anxious and does not have insomnia.   All other systems  reviewed and are negative.    Objective:  Physical Exam Vitals reviewed.  Constitutional:      General: She is not in acute distress.    Appearance: She is well-developed.  HENT:     Head: Normocephalic and atraumatic.  Eyes:     General: No scleral icterus.    Conjunctiva/sclera: Conjunctivae normal.     Pupils: Pupils are equal, round, and reactive to light.  Neck:     Vascular: No JVD.     Trachea: No tracheal deviation.  Cardiovascular:     Rate and Rhythm: Normal rate and regular rhythm.  Heart sounds: Normal heart sounds. No murmur heard. Pulmonary:     Effort: Pulmonary effort is normal. No tachypnea, accessory muscle usage or respiratory distress.     Breath sounds: No stridor. No wheezing, rhonchi or rales.  Abdominal:     General: There is no distension.     Palpations: Abdomen is soft.     Tenderness: There is no abdominal tenderness.  Musculoskeletal:        General: No tenderness.     Cervical back: Neck supple.  Lymphadenopathy:     Cervical: No cervical adenopathy.  Skin:    General: Skin is warm and dry.     Capillary Refill: Capillary refill takes less than 2 seconds.     Findings: No rash.  Neurological:     Mental Status: She is alert and oriented to person, place, and time.  Psychiatric:        Behavior: Behavior normal.      Vitals:   04/29/23 0848  BP: 138/82  Pulse: (!) 54  SpO2: 99%  Weight: 161 lb (73 kg)  Height: 5\' 2"  (1.575 m)   99% on RA BMI Readings from Last 3 Encounters:  04/29/23 29.45 kg/m  03/04/23 28.24 kg/m  12/16/22 28.35 kg/m   Wt Readings from Last 3 Encounters:  04/29/23 161 lb (73 kg)  03/04/23 154 lb 6.4 oz (70 kg)  12/16/22 155 lb (70.3 kg)     CBC    Component Value Date/Time   WBC 5.2 09/01/2022 0914   WBC 7.6 07/17/2015 1011   RBC 4.39 09/01/2022 0914   RBC 5.08 07/17/2015 1011   HGB 13.0 09/01/2022 0914   HCT 39.9 09/01/2022 0914   PLT 239 09/01/2022 0914   MCV 91 09/01/2022 0914   MCH  29.6 09/01/2022 0914   MCH 28.3 07/17/2015 1011   MCHC 32.6 09/01/2022 0914   MCHC 32.0 07/17/2015 1011   RDW 12.4 09/01/2022 0914   LYMPHSABS 1.4 09/01/2022 0914   MONOABS 0.5 07/17/2015 1011   EOSABS 0.0 09/01/2022 0914   BASOSABS 0.1 09/01/2022 0914     Chest Imaging: Lung cancer screening CT December 2025: Patient with a left lower lobe 6 mm pulmonary nodule. The patient's images have been independently reviewed by me.    Pulmonary Functions Testing Results:     No data to display          FeNO:   Pathology:   Echocardiogram:   Heart Catheterization:     Assessment & Plan:     ICD-10-CM   1. Lung nodule  R91.1 CT Chest Wo Contrast    2. Tobacco abuse  Z72.0       Discussion:  68 year old female longstanding history of tobacco abuse new pulmonary nodule, lung RADS 4A.  Plan: Discussed importance of smoking cessation today in the office. Patient has a new nodule which needs short-term CT follow-up as she is high risk for malignancy. Plan for repeat noncontrast CT scan of the chest in 3 months. Follow-up after CT scan of the chest complete in April 2025.     Current Outpatient Medications:    atorvastatin  (LIPITOR) 80 MG tablet, Take 1 tablet (80 mg total) by mouth daily., Disp: 90 tablet, Rfl: 3   buPROPion  (WELLBUTRIN  XL) 150 MG 24 hr tablet, Take 1 tablet (150 mg total) by mouth daily. For smoking cessation, Disp: 90 tablet, Rfl: 1   carvedilol  (COREG ) 12.5 MG tablet, TAKE 1 TABLET BY MOUTH  TWICE DAILY WITH A MEAL, Disp:  180 tablet, Rfl: 3   levothyroxine  (SYNTHROID ) 75 MCG tablet, Take 1 tablet (75 mcg total) by mouth daily before breakfast., Disp: 100 tablet, Rfl: 1   Omega-3 Fatty Acids (FISH OIL PO), Take by mouth., Disp: , Rfl:    traZODone  (DESYREL ) 50 MG tablet, Take 1 tablet (50 mg total) by mouth at bedtime as needed for sleep., Disp: 90 tablet, Rfl: 3   aspirin  EC 81 MG EC tablet, Take 1 tablet (81 mg total) by mouth daily. (Patient not  taking: Reported on 04/29/2023), Disp: , Rfl:    gabapentin  (NEURONTIN ) 300 MG capsule, Take 1 capsule (300 mg total) by mouth 2 (two) times daily. (Patient not taking: Reported on 04/29/2023), Disp: 200 capsule, Rfl: 2   lidocaine  (LIDODERM ) 5 %, Place 1 patch onto the skin daily. Remove & Discard patch within 12 hours or as directed by MD (Patient not taking: Reported on 04/29/2023), Disp: 30 patch, Rfl: 3   lisinopril -hydrochlorothiazide  (ZESTORETIC ) 10-12.5 MG tablet, Take 1 tablet by mouth daily. (Patient not taking: Reported on 04/29/2023), Disp: 90 tablet, Rfl: 3   meloxicam  (MOBIC ) 7.5 MG tablet, Take 1 tablet (7.5 mg total) by mouth daily. (Patient not taking: Reported on 04/29/2023), Disp: 30 tablet, Rfl: 1   Skyelar Halliday L Bettymae Yott, DO [facility] 04/29/2023 10:02 AM

## 2023-04-29 NOTE — Patient Instructions (Signed)
 Thank you for visiting Dr. Thelda Finney at [facility]. Today we recommend the following:  Orders Placed This Encounter  Procedures   CT Chest Wo Contrast   Return in about 3 months (around 07/28/2023).

## 2023-05-07 ENCOUNTER — Encounter (HOSPITAL_BASED_OUTPATIENT_CLINIC_OR_DEPARTMENT_OTHER): Payer: Self-pay

## 2023-05-21 ENCOUNTER — Institutional Professional Consult (permissible substitution): Payer: Medicare Other | Admitting: Pulmonary Disease

## 2023-05-26 ENCOUNTER — Telehealth: Payer: Self-pay | Admitting: *Deleted

## 2023-05-26 DIAGNOSIS — R911 Solitary pulmonary nodule: Secondary | ICD-10-CM

## 2023-05-26 NOTE — Telephone Encounter (Signed)
We have not tried to reach the patient looks like it was the radiology dept who tried to contact the patient.  Since she is due in 07/2023 we call a month ahead of time we also will need a new order since Dr Tonia Brooms is no longer with [place].

## 2023-05-26 NOTE — Telephone Encounter (Signed)
Would like CT done in [facility].

## 2023-05-26 NOTE — Telephone Encounter (Signed)
Looks like we have been trying to reach PT to set up CT. Please call @ [phone]

## 2023-05-27 NOTE — Telephone Encounter (Signed)
Pt is aware of below message and voiced her understanding.   CT has been ordered under [facility].

## 2023-06-25 ENCOUNTER — Telehealth: Payer: Self-pay | Admitting: *Deleted

## 2023-06-25 NOTE — Telephone Encounter (Signed)
 PT has been scheduled. Will give them a call to inform them.

## 2023-06-25 NOTE — Telephone Encounter (Signed)
 Concerned about no CT set up yet. Adv we will not forget her. She'd like to go to [facility].

## 2023-07-15 ENCOUNTER — Ambulatory Visit
Admission: RE | Admit: 2023-07-15 | Discharge: 2023-07-15 | Disposition: A | Discharge status: Home / Self Care | Source: Ambulatory Visit | Attending: Acute Care | Admitting: Acute Care

## 2023-07-15 DIAGNOSIS — R911 Solitary pulmonary nodule: Secondary | ICD-10-CM

## 2023-07-15 DIAGNOSIS — R918 Other nonspecific abnormal finding of lung field: Secondary | ICD-10-CM | POA: Diagnosis not present

## 2023-07-24 ENCOUNTER — Ambulatory Visit: Payer: Medicare Other | Admitting: Acute Care

## 2023-07-24 ENCOUNTER — Encounter: Payer: Self-pay | Admitting: Acute Care

## 2023-07-24 VITALS — BP 153/71 | HR 57 | Ht 62.0 in | Wt 157.2 lb

## 2023-07-24 DIAGNOSIS — F17210 Nicotine dependence, cigarettes, uncomplicated: Secondary | ICD-10-CM

## 2023-07-24 DIAGNOSIS — T17500A Unspecified foreign body in bronchus causing asphyxiation, initial encounter: Secondary | ICD-10-CM

## 2023-07-24 DIAGNOSIS — F17200 Nicotine dependence, unspecified, uncomplicated: Secondary | ICD-10-CM

## 2023-07-24 DIAGNOSIS — R911 Solitary pulmonary nodule: Secondary | ICD-10-CM

## 2023-07-24 NOTE — Patient Instructions (Addendum)
 It is good to see you today. Your follow up CT Chest shows a stable nodule or a mucus impaction. This is good news. We will continue to watch this closely with a 6 month follow up Low Dose CT chest through the Lung Cancer Screening Program. You will get a call to get this scheduled closer to the time.  Please work om quitting smoking. You can receive free nicotine replacement therapy (patches, gum, or mints) by calling 1-800-QUIT NOW. Please call so we can get you on the path to becoming a non-smoker. I know it is hard, but you can do this!  Hypnosis for smoking cessation  [employer] [phone]  Acupuncture for smoking cessation  [employer] [phone]    Please contact office for sooner follow up if symptoms do not improve or worsen or seek emergency care

## 2023-07-24 NOTE — Progress Notes (Signed)
 History of Present Illness Danielle Berger is a 68 y.o. female current everyday smoker  Referred in January 2025 for pulmonary nodule by Danielle Register, MD.  Synopsis 68 year old female longstanding history of tobacco abuse, hypertension, hypothyroidism.  Patient had lung cancer screening CT completed by primary care provider which found a new left lower lobe 6 mm pulmonary nodule read as a lung RADS 4A referred here for evaluation of pulmonary nodule. She was last seen by Dr. Tonia Berger April 29, 2023.  At that time he reviewed her most recent CT chest which showed a new nodule requiring short-term follow-up. Repeat CT scan was ordered for April 2025.  Patient presents today to review results of most recent short-term follow-up CT chest and to determine next best steps in plan of care.  Patient is continuing to smoke, she finds it difficult to quit because her husband is also smoking in the home.    07/24/2023 Pt. Presents for follow up.  She states she has been doing well from a pulmonary perspective.  She has had no weight loss or hemoptysis.  We have reviewed her most recent CT scan done July 15, 2023.  This CT shows a probable nodular mucoid impaction in the left lower lobe that is stable when compared to the March 19, 2023 scan.  Recommendation is for follow-up low-dose CT in 6 months to continue to monitor this area closely.  Both the patient and her husband are in agreement with this plan.   Test Results: 07/15/2023 CT Chest without Contrast Probable nodular mucoid impaction in the left lower lobe, stable from 03/19/2023. Recommend follow-up low-dose lung cancer screening CT in 6 months as malignancy cannot be excluded.  Aortic atherosclerosis (ICD10-I70.0). Coronary artery calcification. 3.  Emphysema (ICD10-J43.9).  LDCT 03/18/2024 New 6.0 mm left lower lobe nodule. Lung-RADS 4A, suspicious. Follow up low-dose chest CT without contrast in 3 months (please use the  following order, "CT CHEST LCS NODULE FOLLOW-UP W/O CM") is recommended. Alternatively, PET may be considered when there is a solid component 8mm or larger.  2. Aortic atherosclerosis (ICD10-I70.0). Coronary artery calcification. 3.  Emphysema (ICD10-J43.9).         Latest Ref Rng & Units 09/01/2022    9:14 AM 07/09/2021    9:19 AM 10/11/2019   12:08 PM  CBC  WBC 3.4 - 10.8 x10E3/uL 5.2  5.1  6.9   Hemoglobin 11.1 - 15.9 g/dL 16.1  09.6  04.5   Hematocrit 34.0 - 46.6 % 39.9  39.6  38.0   Platelets 150 - 450 x10E3/uL 239  249  300        Latest Ref Rng & Units 03/04/2023    9:27 AM 09/01/2022    9:14 AM 02/28/2022    9:30 AM  BMP  Glucose 70 - 99 mg/dL 409  811  914   BUN 8 - 27 mg/dL 28  21  23    Creatinine 0.57 - 1.00 mg/dL 7.82  9.56  2.13   BUN/Creat Ratio 12 - 28 18  15  16    Sodium 134 - 144 mmol/L 139  142  143   Potassium 3.5 - 5.2 mmol/L 5.1  4.6  4.7   Chloride 96 - 106 mmol/L 101  104  102   CO2 20 - 29 mmol/L 23  23  26    Calcium 8.7 - 10.3 mg/dL 9.5  9.3  9.6     BNP No results found for: "BNP"  ProBNP No results found for: "  PROBNP"  PFT No results found for: "FEV1PRE", "FEV1POST", "FVCPRE", "FVCPOST", "TLC", "DLCOUNC", "PREFEV1FVCRT", "PSTFEV1FVCRT"  CT CHEST WO CONTRAST Result Date: 07/22/2023 CLINICAL DATA:  Lung nodule. EXAM: CT CHEST WITHOUT CONTRAST TECHNIQUE: Multidetector CT imaging of the chest was performed following the standard protocol without IV contrast. RADIATION DOSE REDUCTION: This exam was performed according to the departmental dose-optimization program which includes automated exposure control, adjustment of the mA and/or kV according to patient size and/or use of iterative reconstruction technique. COMPARISON:  03/19/2023, 10/21/2019. FINDINGS: Cardiovascular: Atherosclerotic calcification of the aorta, aortic valve and coronary arteries. Heart size normal. No pericardial effusion. Mediastinum/Nodes: No pathologically enlarged mediastinal  or axillary lymph nodes. Hilar regions are difficult to definitively evaluate without IV contrast. Esophagus is grossly unremarkable. Lungs/Pleura: Centrilobular emphysema. Minimal scattered mucoid impaction. Smoking related respiratory bronchiolitis. Focal nodular mucoid impaction in the left lower lobe (8/78), unchanged from 03/19/2023 but new from 10/21/2019. No pleural fluid. No pleural fluid. Airway is unremarkable. Upper Abdomen: Visualized portions of the liver, gallbladder, adrenal glands, kidneys, spleen, pancreas, stomach and bowel are grossly unremarkable. No upper abdominal adenopathy. Musculoskeletal: Degenerative changes in the spine. IMPRESSION: 1. Probable nodular mucoid impaction in the left lower lobe, stable from 03/19/2023. Recommend follow-up low-dose lung cancer screening CT in 6 months as malignancy cannot be excluded. 2. Aortic atherosclerosis (ICD10-I70.0). Coronary artery calcification. 3.  Emphysema (ICD10-J43.9). Electronically Signed   By: Danielle Berger M.D.   On: 07/22/2023 11:14     Past medical hx Past Medical History:  Diagnosis Date   Chest pain 07/17/2015   Chronic renal disease    AS A CHILD   Esophageal reflux    PT. DENIES 02/15/19   Headache    HLD (hyperlipidemia)    Hypertension    Hypothyroidism    Tobacco abuse 07/17/2015     Social History   Tobacco Use   Smoking status: Every Day    Current packs/day: 0.50    Types: Cigarettes   Smokeless tobacco: Never  Vaping Use   Vaping status: Never Used  Substance Use Topics   Alcohol use: Yes    Alcohol/week: 2.0 standard drinks of alcohol    Types: 1 Glasses of wine, 1 Shots of liquor per week    Comment: occ   Drug use: No    Danielle Berger reports that she has been smoking cigarettes. She has never used smokeless tobacco. She reports current alcohol use of about 2.0 standard drinks of alcohol per week. She reports that she does not use drugs.  Tobacco Cessation: Ready to quit: Not  Answered Counseling given: Not Answered Current everyday smoker with a pack year smoking history of 20+ pack years. Patient has been counseled for 3 to 4 minutes at today's visit regarding smoking cessation.  We discussed that she has some behavior elements to her smoking addiction that may be amendable to hypnosis.  I have provided her with multiple options for smoking cessation.  I have asked her to call the office if she needs further support.  Past surgical hx, Family hx, Social hx all reviewed.  Current Outpatient Medications on File Prior to Visit  Medication Sig   atorvastatin (LIPITOR) 80 MG tablet Take 1 tablet (80 mg total) by mouth daily.   buPROPion (WELLBUTRIN XL) 150 MG 24 hr tablet Take 1 tablet (150 mg total) by mouth daily. For smoking cessation   carvedilol (COREG) 12.5 MG tablet TAKE 1 TABLET BY MOUTH  TWICE DAILY WITH A MEAL   gabapentin (NEURONTIN) 300  MG capsule Take 1 capsule (300 mg total) by mouth 2 (two) times daily.   levothyroxine (SYNTHROID) 75 MCG tablet Take 1 tablet (75 mcg total) by mouth daily before breakfast.   lidocaine (LIDODERM) 5 % Place 1 patch onto the skin daily. Remove & Discard patch within 12 hours or as directed by MD   lisinopril-hydrochlorothiazide (ZESTORETIC) 10-12.5 MG tablet Take 1 tablet by mouth daily.   meloxicam (MOBIC) 7.5 MG tablet Take 1 tablet (7.5 mg total) by mouth daily.   Omega-3 Fatty Acids (FISH OIL PO) Take by mouth.   traZODone (DESYREL) 50 MG tablet Take 1 tablet (50 mg total) by mouth at bedtime as needed for sleep.   aspirin EC 81 MG EC tablet Take 1 tablet (81 mg total) by mouth daily. (Patient not taking: Reported on 07/24/2023)   No current facility-administered medications on file prior to visit.     Allergies  Allergen Reactions   Codeine Nausea And Vomiting   Penicillins Rash    Review Of Systems:  Constitutional:   No  weight loss, night sweats,  Fevers, chills, fatigue, or  lassitude.  HEENT:   No  headaches,  Difficulty swallowing,  Tooth/dental problems, or  Sore throat,                No sneezing, itching, ear ache, nasal congestion, post nasal drip,   CV:  No chest pain,  Orthopnea, PND, swelling in lower extremities, anasarca, dizziness, palpitations, syncope.   GI  No heartburn, indigestion, abdominal pain, nausea, vomiting, diarrhea, change in bowel habits, loss of appetite, bloody stools.   Resp: No shortness of breath with exertion or at rest.  No excess mucus, no productive cough,  No non-productive cough,  No coughing up of blood.  No change in color of mucus.  No wheezing.  No chest wall deformity  Skin: no rash or lesions.  GU: no dysuria, change in color of urine, no urgency or frequency.  No flank pain, no hematuria   MS:  No joint pain or swelling.  No decreased range of motion.  No back pain.  Psych:  No change in mood or affect. No depression or anxiety.  No memory loss.   Vital Signs BP (!) 153/71 (BP Location: Left Arm, Patient Position: Sitting, Cuff Size: Normal)   Pulse (!) 57   Ht 5\' 2"  (1.575 m)   Wt 157 lb 3.2 oz (71.3 kg)   SpO2 98%   BMI 28.75 kg/m    Physical Exam:  General- No distress,  A&Ox3, cooperative and appropriate ENT: No sinus tenderness, TM clear, pale nasal mucosa, no oral exudate,no post nasal drip, no LAN, no JVD Cardiac: S1, S2, regular rate and rhythm, no murmur Chest: No wheeze/ rales/ dullness; no accessory muscle use, no nasal flaring, no sternal retractions Abd.: Soft Non-tender, nondistended, bowel sounds positive,Body mass index is 28.75 kg/m.  Ext: No clubbing cyanosis, edema, no obvious deformities Neuro:  normal strength, moving all extremities x 4, alert and oriented x 3 Skin: Warm dry and intact , no rashes, warm and dry, no obvious lesions Psych: normal mood and behavior   Assessment/Plan Follow-up for abnormal low-dose screening CT. Nodule of concern appears to be possible mucoid impaction and is stable since  December 2024 scan Current everyday smoker with a 20+ pack year smoking history Plan Your follow up CT Chest shows a stable nodule or a mucus impaction. This is good news. We will continue to watch this closely with a  6 month follow up Low Dose CT chest through the Lung Cancer Screening Program. You will get a call to get this scheduled closer to the time.  Please work om quitting smoking. You can receive free nicotine replacement therapy (patches, gum, or mints) by calling 1-800-QUIT NOW. Please call so we can get you on the path to becoming a non-smoker. I know it is hard, but you can do this!  Hypnosis for smoking cessation  [employer] [phone]  Acupuncture for smoking cessation  [employer] [phone]    Please contact office for sooner follow up if symptoms do not improve or worsen or seek emergency care    I spent 20 minutes dedicated to the care of this patient on the date of this encounter to include pre-visit review of records, face-to-face time with the patient discussing conditions above, post visit ordering of testing, clinical documentation with the electronic health record, making appropriate referrals as documented, and communicating necessary information to the patient's healthcare team.   Bevelyn Ngo, NP 07/24/2023  9:14 AM

## 2023-09-03 ENCOUNTER — Ambulatory Visit: Payer: Medicare Other | Attending: Family Medicine | Admitting: Family Medicine

## 2023-09-03 ENCOUNTER — Encounter: Payer: Self-pay | Admitting: Family Medicine

## 2023-09-03 VITALS — BP 136/79 | HR 54 | Ht 62.0 in | Wt 155.8 lb

## 2023-09-03 DIAGNOSIS — E782 Mixed hyperlipidemia: Secondary | ICD-10-CM

## 2023-09-03 DIAGNOSIS — N183 Chronic kidney disease, stage 3 unspecified: Secondary | ICD-10-CM

## 2023-09-03 DIAGNOSIS — E038 Other specified hypothyroidism: Secondary | ICD-10-CM

## 2023-09-03 DIAGNOSIS — E1122 Type 2 diabetes mellitus with diabetic chronic kidney disease: Secondary | ICD-10-CM | POA: Diagnosis not present

## 2023-09-03 DIAGNOSIS — G4709 Other insomnia: Secondary | ICD-10-CM | POA: Diagnosis not present

## 2023-09-03 DIAGNOSIS — F17210 Nicotine dependence, cigarettes, uncomplicated: Secondary | ICD-10-CM

## 2023-09-03 DIAGNOSIS — R7303 Prediabetes: Secondary | ICD-10-CM

## 2023-09-03 DIAGNOSIS — I129 Hypertensive chronic kidney disease with stage 1 through stage 4 chronic kidney disease, or unspecified chronic kidney disease: Secondary | ICD-10-CM | POA: Diagnosis not present

## 2023-09-03 DIAGNOSIS — N1832 Chronic kidney disease, stage 3b: Secondary | ICD-10-CM | POA: Diagnosis not present

## 2023-09-03 LAB — POCT GLYCOSYLATED HEMOGLOBIN (HGB A1C): HbA1c, POC (controlled diabetic range): 5.8 % (ref 0.0–7.0)

## 2023-09-03 MED ORDER — TRAZODONE HCL 100 MG PO TABS: 100.0000 mg | ORAL_TABLET | Freq: Every evening | ORAL | 3 refills | Status: DC | PRN

## 2023-09-03 NOTE — Patient Instructions (Signed)
 VISIT SUMMARY:  Today, you had a routine follow-up visit. We discussed your ongoing health conditions, including prediabetes, hypertension, hypothyroidism, hyperlipidemia, insomnia, and tobacco use. We reviewed your current treatments and made some adjustments to better manage your health.  YOUR PLAN:  -PREDIABETES: Prediabetes means your blood sugar levels are higher than normal but not high enough to be classified as diabetes. Your A1c level has improved to 5.8, which is an early stage of prediabetes. We emphasized the importance of diet and exercise to manage and potentially reverse this condition. We ordered an A1c test for today.  -HYPERTENSION: Hypertension, or high blood pressure, is well-controlled with your current medications. We recommend continuing your current antihypertensive medications.  -HYPOTHYROIDISM: Hypothyroidism is a condition where your thyroid  does not produce enough hormones. Your thyroid  function requires monitoring due to your use of levothyroxine . We ordered thyroid  function tests and recommend continuing your current dose of 75 mcg daily.  -HYPERLIPIDEMIA: Hyperlipidemia means you have high levels of fats (lipids) in your blood. Your cholesterol levels were excellent as of May last year, but reassessment is needed. We ordered a lipid panel to check your current levels.  -INSOMNIA: Insomnia is difficulty falling or staying asleep. You occasionally use trazodone  100 mg to help with sleep. We have increased your trazodone  prescription to a 90-day supply to ensure you have enough medication.  -TOBACCO USE DISORDER: Tobacco use disorder is the dependence on tobacco products. We provided resources for smoking cessation and emphasized its importance for your overall health. We encourage you to continue using these resources.  INSTRUCTIONS:  Please complete the A1c test, thyroid  function tests, and lipid panel as ordered. Continue taking your current medications as  prescribed. Follow up with your pulmonologist as scheduled in six months. If you have any questions or concerns, please contact our office.

## 2023-09-03 NOTE — Progress Notes (Signed)
 Subjective:  Patient ID: Danielle Berger, female    DOB: [birth year]  Age: 68 y.o. MRN: [id number]  CC: Medical Management of Chronic Issues (Medication )     Discussed the use of AI scribe software for clinical note transcription with the patient, who gave verbal consent to proceed.  History of Present Illness Danielle Berger is a 68 year old female with a history of Tobacco abuse (>49 pack year), hypertension, hyperlipidemia, hypothyroidism, stage III CKD.  Insomnia who presents for a routine follow-up visit.  She is under the care of a pulmonologist for a pulmonary nodule with a follow-up scheduled in six months.  Per notes nodule was thought to be secondary to mucous.  She has resources for smoking cessation. A recent Quantiflow test indicates decreased circulation without claudication, dyspnea, or edema.  She manages prediabetes with dietary modifications, avoiding high carbohydrate foods, though she finds it challenging during winter. She takes levothyroxine  for hypothyroidism. For sleep, she occasionally uses trazodone  100 mg and would like an increase in her prescription which is currently 50 mg 200 mg. She works part-time as a Engineer, structural and stays active with home and yard work. Her morning routine includes coffee, sometimes with cream and sugar, and she uses sweet and low as a sweetener.    Past Medical History:  Diagnosis Date   Chest pain 07/17/2015   Chronic renal disease    AS A CHILD   Esophageal reflux    PT. DENIES 02/15/19   Headache    HLD (hyperlipidemia)    Hypertension    Hypothyroidism    Tobacco abuse 07/17/2015    Past Surgical History:  Procedure Laterality Date   Brain Tumor removed      when patient was in the 6th grade   KIDNEY SURGERY     when patient was int he 8th grade    Family History  Adopted: Yes    Social History   Socioeconomic History   Marital status: Married    Spouse name: Not on file   Number of children: 1    Years of education: Not on file   Highest education level: 12th grade  Occupational History   Not on file  Tobacco Use   Smoking status: Every Day    Current packs/day: 0.50    Types: Cigarettes   Smokeless tobacco: Never  Vaping Use   Vaping status: Never Used  Substance and Sexual Activity   Alcohol use: Yes    Alcohol/week: 2.0 standard drinks of alcohol    Types: 1 Glasses of wine, 1 Shots of liquor per week    Comment: occ   Drug use: No   Sexual activity: Yes    Birth control/protection: None  Other Topics Concern   Not on file  Social History Narrative   Not on file   Social Drivers of Health   Financial Resource Strain: Low Risk  (12/16/2022)   Overall Financial Resource Strain (CARDIA)    Difficulty of Paying Living Expenses: Not hard at all  Food Insecurity: No Food Insecurity (12/16/2022)   Hunger Vital Sign    Worried About Running Out of Food in the Last Year: Never true    Ran Out of Food in the Last Year: Never true  Transportation Needs: No Transportation Needs (12/16/2022)   PRAPARE - Administrator, Civil Service (Medical): No    Lack of Transportation (Non-Medical): No  Physical Activity: Inactive (12/16/2022)   Exercise Vital Sign  Days of Exercise per Week: 0 days    Minutes of Exercise per Session: 0 min  Stress: No Stress Concern Present (12/16/2022)   [employer] of Occupational Health - Occupational Stress Questionnaire    Feeling of Stress : Not at all  Social Connections: Moderately Isolated (12/16/2022)   Social Connection and Isolation Panel [NHANES]    Frequency of Communication with Friends and Family: More than three times a week    Frequency of Social Gatherings with Friends and Family: Three times a week    Attends Religious Services: Never    Active Member of Clubs or Organizations: No    Attends Banker Meetings: Never    Marital Status: Married    Allergies  Allergen Reactions   Codeine Nausea And  Vomiting   Penicillins Rash    Outpatient Medications Prior to Visit  Medication Sig Dispense Refill   atorvastatin  (LIPITOR) 80 MG tablet Take 1 tablet (80 mg total) by mouth daily. 90 tablet 3   buPROPion  (WELLBUTRIN  XL) 150 MG 24 hr tablet Take 1 tablet (150 mg total) by mouth daily. For smoking cessation 90 tablet 1   carvedilol  (COREG ) 12.5 MG tablet TAKE 1 TABLET BY MOUTH  TWICE DAILY WITH A MEAL 180 tablet 3   gabapentin  (NEURONTIN ) 300 MG capsule Take 1 capsule (300 mg total) by mouth 2 (two) times daily. 200 capsule 2   levothyroxine  (SYNTHROID ) 75 MCG tablet Take 1 tablet (75 mcg total) by mouth daily before breakfast. 100 tablet 1   lidocaine  (LIDODERM ) 5 % Place 1 patch onto the skin daily. Remove & Discard patch within 12 hours or as directed by MD 30 patch 3   lisinopril -hydrochlorothiazide  (ZESTORETIC ) 10-12.5 MG tablet Take 1 tablet by mouth daily. 90 tablet 3   meloxicam  (MOBIC ) 7.5 MG tablet Take 1 tablet (7.5 mg total) by mouth daily. 30 tablet 1   Omega-3 Fatty Acids (FISH OIL PO) Take by mouth.     traZODone  (DESYREL ) 50 MG tablet Take 1 tablet (50 mg total) by mouth at bedtime as needed for sleep. 90 tablet 3   aspirin  EC 81 MG EC tablet Take 1 tablet (81 mg total) by mouth daily. (Patient not taking: Reported on 09/03/2023)     No facility-administered medications prior to visit.     ROS Review of Systems  Constitutional:  Negative for activity change and appetite change.  HENT:  Negative for sinus pressure and sore throat.   Respiratory:  Negative for chest tightness, shortness of breath and wheezing.   Cardiovascular:  Negative for chest pain and palpitations.  Gastrointestinal:  Negative for abdominal distention, abdominal pain and constipation.  Genitourinary: Negative.   Musculoskeletal: Negative.   Psychiatric/Behavioral:  Positive for sleep disturbance. Negative for behavioral problems and dysphoric mood.     Objective:  BP 136/79   Pulse (!) 54   Ht  5\' 2"  (1.575 m)   Wt 155 lb 12.8 oz (70.7 kg)   SpO2 98%   BMI 28.50 kg/m      09/03/2023    8:39 AM 07/24/2023    9:00 AM 04/29/2023    8:48 AM  BP/Weight  Systolic BP 136 153 138  Diastolic BP 79 71 82  Wt. (Lbs) 155.8 157.2 161  BMI 28.5 kg/m2 28.75 kg/m2 29.45 kg/m2      Physical Exam Constitutional:      Appearance: She is well-developed.  Cardiovascular:     Rate and Rhythm: Normal rate.  Heart sounds: Normal heart sounds. No murmur heard. Pulmonary:     Effort: Pulmonary effort is normal.     Breath sounds: Normal breath sounds. No wheezing or rales.  Chest:     Chest wall: No tenderness.  Abdominal:     General: Bowel sounds are normal. There is no distension.     Palpations: Abdomen is soft. There is no mass.     Tenderness: There is no abdominal tenderness.  Musculoskeletal:        General: Normal range of motion.     Right lower leg: No edema.     Left lower leg: No edema.  Neurological:     Mental Status: She is alert and oriented to person, place, and time.  Psychiatric:        Mood and Affect: Mood normal.        Latest Ref Rng & Units 03/04/2023    9:27 AM 09/01/2022    9:14 AM 02/28/2022    9:30 AM  CMP  Glucose 70 - 99 mg/dL 562  130  865   BUN 8 - 27 mg/dL 28  21  23    Creatinine 0.57 - 1.00 mg/dL 7.84  6.96  2.95   Sodium 134 - 144 mmol/L 139  142  143   Potassium 3.5 - 5.2 mmol/L 5.1  4.6  4.7   Chloride 96 - 106 mmol/L 101  104  102   CO2 20 - 29 mmol/L 23  23  26    Calcium  8.7 - 10.3 mg/dL 9.5  9.3  9.6   Total Protein 6.0 - 8.5 g/dL  6.6  6.8   Total Bilirubin 0.0 - 1.2 mg/dL  0.4  0.3   Alkaline Phos 44 - 121 IU/L  107  104   AST 0 - 40 IU/L  22  20   ALT 0 - 32 IU/L  20  19     Lipid Panel     Component Value Date/Time   CHOL 173 09/01/2022 0914   TRIG 159 (H) 09/01/2022 0914   HDL 45 09/01/2022 0914   CHOLHDL 4.8 (H) 01/09/2021 0906   CHOLHDL 3.7 05/07/2016 0946   VLDL 34 (H) 05/07/2016 0946   LDLCALC 100 (H)  09/01/2022 0914    CBC    Component Value Date/Time   WBC 5.2 09/01/2022 0914   WBC 7.6 07/17/2015 1011   RBC 4.39 09/01/2022 0914   RBC 5.08 07/17/2015 1011   HGB 13.0 09/01/2022 0914   HCT 39.9 09/01/2022 0914   PLT 239 09/01/2022 0914   MCV 91 09/01/2022 0914   MCH 29.6 09/01/2022 0914   MCH 28.3 07/17/2015 1011   MCHC 32.6 09/01/2022 0914   MCHC 32.0 07/17/2015 1011   RDW 12.4 09/01/2022 0914   LYMPHSABS 1.4 09/01/2022 0914   MONOABS 0.5 07/17/2015 1011   EOSABS 0.0 09/01/2022 0914   BASOSABS 0.1 09/01/2022 0914    Lab Results  Component Value Date   HGBA1C 5.8 09/03/2023    Lab Results  Component Value Date   TSH 3.980 03/04/2023      1. Prediabetes (Primary) Labs reveal prediabetes with an A1c of 5.8.  An A1c of 6.5 is supportive of a diagnosis of type 2 diabetes mellitus.  Working on a low carbohydrate diet, exercise, weight loss is recommended in order to prevent progression to type 2 diabetes mellitus. - POCT glycosylated hemoglobin (Hb A1C)  2. Other insomnia Uncontrolled Increase trazodone  from 50 mg to 100 mg -  traZODone  (DESYREL ) 100 MG tablet; Take 1 tablet (100 mg total) by mouth at bedtime as needed for sleep.  Dispense: 90 tablet; Refill: 3  3. Other specified hypothyroidism Controlled with normal TSH in 02/2023 Will send off thyroid  panel and adjust regimen accordingly - T4, free - TSH - T3  4. Mixed hyperlipidemia Controlled Low-cholesterol diet Continue statin - LP+Non-HDL Cholesterol  5. Benign hypertension with chronic kidney disease, stage III (HCC) Controlled Counseled on blood pressure goal of less than 130/80, low-sodium, DASH diet, medication compliance, 150 minutes of moderate intensity exercise per week. Discussed medication compliance, adverse effects. - CMP14+EGFR - CBC with Differential/Platelet  6. Smoking greater than 20 pack years Up-to-date on lung cancer screening Continue to follow-up with pulmonary   Meds  ordered this encounter  Medications   traZODone  (DESYREL ) 100 MG tablet    Sig: Take 1 tablet (100 mg total) by mouth at bedtime as needed for sleep.    Dispense:  90 tablet    Refill:  3    Dose increase    Follow-up: Return in about 6 months (around 03/05/2024) for Chronic medical conditions.       Joaquin Mulberry, MD, FAAFP. [facility] [place], [place] [phone]   09/03/2023, 1:20 PM

## 2023-09-04 ENCOUNTER — Ambulatory Visit: Payer: Self-pay | Admitting: Family Medicine

## 2023-09-04 DIAGNOSIS — E038 Other specified hypothyroidism: Secondary | ICD-10-CM

## 2023-09-04 LAB — CBC WITH DIFFERENTIAL/PLATELET
Basophils Absolute: 0 10*3/uL (ref 0.0–0.2)
Basos: 1 %
EOS (ABSOLUTE): 0.1 10*3/uL (ref 0.0–0.4)
Eos: 1 %
Hematocrit: 40.3 % (ref 34.0–46.6)
Hemoglobin: 12.9 g/dL (ref 11.1–15.9)
Immature Grans (Abs): 0 10*3/uL (ref 0.0–0.1)
Immature Granulocytes: 0 %
Lymphocytes Absolute: 1.5 10*3/uL (ref 0.7–3.1)
Lymphs: 27 %
MCH: 30.1 pg (ref 26.6–33.0)
MCHC: 32 g/dL (ref 31.5–35.7)
MCV: 94 fL (ref 79–97)
Monocytes Absolute: 0.4 10*3/uL (ref 0.1–0.9)
Monocytes: 8 %
Neutrophils Absolute: 3.5 10*3/uL (ref 1.4–7.0)
Neutrophils: 63 %
Platelets: 245 10*3/uL (ref 150–450)
RBC: 4.28 x10E6/uL (ref 3.77–5.28)
RDW: 12.5 % (ref 11.7–15.4)
WBC: 5.6 10*3/uL (ref 3.4–10.8)

## 2023-09-04 LAB — T4, FREE: Free T4: 1.48 ng/dL (ref 0.82–1.77)

## 2023-09-04 LAB — CMP14+EGFR
ALT: 15 IU/L (ref 0–32)
AST: 20 IU/L (ref 0–40)
Albumin: 4.7 g/dL (ref 3.9–4.9)
Alkaline Phosphatase: 102 IU/L (ref 44–121)
BUN/Creatinine Ratio: 18 (ref 12–28)
BUN: 26 mg/dL (ref 8–27)
Bilirubin Total: 0.4 mg/dL (ref 0.0–1.2)
CO2: 25 mmol/L (ref 20–29)
Calcium: 9.6 mg/dL (ref 8.7–10.3)
Chloride: 100 mmol/L (ref 96–106)
Creatinine, Ser: 1.46 mg/dL — ABNORMAL HIGH (ref 0.57–1.00)
Globulin, Total: 2.1 g/dL (ref 1.5–4.5)
Glucose: 103 mg/dL — ABNORMAL HIGH (ref 70–99)
Potassium: 4.9 mmol/L (ref 3.5–5.2)
Sodium: 139 mmol/L (ref 134–144)
Total Protein: 6.8 g/dL (ref 6.0–8.5)
eGFR: 39 mL/min/{1.73_m2} — ABNORMAL LOW (ref >59)

## 2023-09-04 LAB — TSH: TSH: 2.13 u[IU]/mL (ref 0.450–4.500)

## 2023-09-04 LAB — LP+NON-HDL CHOLESTEROL
Cholesterol, Total: 199 mg/dL (ref 100–199)
HDL: 44 mg/dL (ref >39)
LDL Chol Calc (NIH): 116 mg/dL — ABNORMAL HIGH (ref 0–99)
Total Non-HDL-Chol (LDL+VLDL): 155 mg/dL — ABNORMAL HIGH (ref 0–129)
Triglycerides: 226 mg/dL — ABNORMAL HIGH (ref 0–149)
VLDL Cholesterol Cal: 39 mg/dL (ref 5–40)

## 2023-09-04 LAB — T3: T3, Total: 89 ng/dL (ref 71–180)

## 2023-09-04 MED ORDER — LEVOTHYROXINE SODIUM 75 MCG PO TABS: 75.0000 ug | ORAL_TABLET | Freq: Every day | ORAL | 1 refills | Status: DC

## 2023-09-04 MED ORDER — ROSUVASTATIN CALCIUM 20 MG PO TABS: 20.0000 mg | ORAL_TABLET | Freq: Every day | ORAL | 3 refills | Status: DC

## 2023-09-25 ENCOUNTER — Ambulatory Visit: Payer: Self-pay | Admitting: Pulmonary Disease

## 2023-11-06 ENCOUNTER — Other Ambulatory Visit: Payer: Self-pay | Admitting: Family Medicine

## 2023-11-06 DIAGNOSIS — E1122 Type 2 diabetes mellitus with diabetic chronic kidney disease: Secondary | ICD-10-CM

## 2023-12-03 DIAGNOSIS — I129 Hypertensive chronic kidney disease with stage 1 through stage 4 chronic kidney disease, or unspecified chronic kidney disease: Secondary | ICD-10-CM | POA: Diagnosis not present

## 2023-12-03 DIAGNOSIS — Z720 Tobacco use: Secondary | ICD-10-CM | POA: Diagnosis not present

## 2023-12-03 DIAGNOSIS — N1832 Chronic kidney disease, stage 3b: Secondary | ICD-10-CM | POA: Diagnosis not present

## 2023-12-03 DIAGNOSIS — E785 Hyperlipidemia, unspecified: Secondary | ICD-10-CM | POA: Diagnosis not present

## 2023-12-22 ENCOUNTER — Ambulatory Visit: Payer: Medicare Other | Attending: Family Medicine

## 2023-12-22 VITALS — Ht 62.0 in | Wt 157.0 lb

## 2023-12-22 DIAGNOSIS — Z0000 Encounter for general adult medical examination without abnormal findings: Secondary | ICD-10-CM | POA: Diagnosis not present

## 2023-12-22 NOTE — Progress Notes (Signed)
 Because this visit was a virtual/telehealth visit,  certain criteria was not obtained, such a blood pressure, CBG if applicable, and timed get up and go. Any medications not marked as taking were not mentioned during the medication reconciliation part of the visit. Any vitals not documented were not able to be obtained due to this being a telehealth visit or patient was unable to self-report a recent blood pressure reading due to a lack of equipment at home via telehealth. Vitals that have been documented are verbally provided by the patient.   Subjective:   Danielle Berger is a 68 y.o. who presents for a Medicare Wellness preventive visit.  As a reminder, Annual Wellness Visits don't include a physical exam, and some assessments may be limited, especially if this visit is performed virtually. We may recommend an in-person follow-up visit with your provider if needed.  Visit Complete: Virtual I connected with  Danielle Berger on 12/22/23 by a audio enabled telemedicine application and verified that I am speaking with the correct person using two identifiers.  Patient Location: Home  Provider Location: Office/Clinic  I discussed the limitations of evaluation and management by telemedicine. The patient expressed understanding and agreed to proceed.  Vital Signs: Because this visit was a virtual/telehealth visit, some criteria may be missing or patient reported. Any vitals not documented were not able to be obtained and vitals that have been documented are patient reported.  VideoDeclined- This patient declined Librarian, academic. Therefore the visit was completed with audio only.  Persons Participating in Visit: Patient.  AWV Questionnaire: No: Patient Medicare AWV questionnaire was not completed prior to this visit.  Cardiac Risk Factors include: advanced age (>33men, >72 women);hypertension;dyslipidemia;smoking/ tobacco exposure      Objective:    Today's Vitals   12/22/23 0920  Weight: 157 lb (71.2 kg)  Height: 5' 2 (1.575 m)  PainSc: 0-No pain   Body mass index is 28.72 kg/m.     12/22/2023    9:22 AM 12/16/2022    8:58 AM 03/24/2022    3:37 PM 10/26/2020    9:04 AM 10/11/2019   10:22 AM 10/11/2019   10:16 AM 02/22/2018   11:08 AM  Advanced Directives  Does Patient Have a Medical Advance Directive? No No No No No No No   Would patient like information on creating a medical advance directive? No - Patient declined Yes (MAU/Ambulatory/Procedural Areas - Information given) Yes (ED - Information included in AVS) No - Patient declined   No - Patient declined      Data saved with a previous flowsheet row definition    Current Medications (verified) Outpatient Encounter Medications as of 12/22/2023  Medication Sig   aspirin  EC 81 MG EC tablet Take 1 tablet (81 mg total) by mouth daily. (Patient not taking: Reported on 09/03/2023)   buPROPion  (WELLBUTRIN  XL) 150 MG 24 hr tablet Take 1 tablet (150 mg total) by mouth daily. For smoking cessation   carvedilol  (COREG ) 12.5 MG tablet TAKE 1 TABLET BY MOUTH TWICE  DAILY WITH A MEAL   gabapentin  (NEURONTIN ) 300 MG capsule Take 1 capsule (300 mg total) by mouth 2 (two) times daily.   levothyroxine  (SYNTHROID ) 75 MCG tablet Take 1 tablet (75 mcg total) by mouth daily before breakfast.   lidocaine  (LIDODERM ) 5 % Place 1 patch onto the skin daily. Remove & Discard patch within 12 hours or as directed by MD   lisinopril -hydrochlorothiazide  (ZESTORETIC ) 10-12.5 MG tablet TAKE 1 TABLET BY  MOUTH DAILY   meloxicam  (MOBIC ) 7.5 MG tablet Take 1 tablet (7.5 mg total) by mouth daily.   Omega-3 Fatty Acids (FISH OIL PO) Take by mouth.   rosuvastatin  (CRESTOR ) 20 MG tablet Take 1 tablet (20 mg total) by mouth daily.   traZODone  (DESYREL ) 100 MG tablet Take 1 tablet (100 mg total) by mouth at bedtime as needed for sleep.   No facility-administered encounter medications on file as of  12/22/2023.    Allergies (verified) Codeine and Penicillins   History: Past Medical History:  Diagnosis Date   Chest pain 07/17/2015   Chronic renal disease    AS A CHILD   Esophageal reflux    PT. DENIES 02/15/19   Headache    HLD (hyperlipidemia)    Hypertension    Hypothyroidism    Tobacco abuse 07/17/2015   Past Surgical History:  Procedure Laterality Date   Brain Tumor removed      when patient was in the 6th grade   KIDNEY SURGERY     when patient was int he 8th grade   Family History  Adopted: Yes   Social History   Socioeconomic History   Marital status: Married    Spouse name: Not on file   Number of children: 1   Years of education: Not on file   Highest education level: 12th grade  Occupational History   Not on file  Tobacco Use   Smoking status: Every Day    Current packs/day: 0.50    Types: Cigarettes   Smokeless tobacco: Never  Vaping Use   Vaping status: Never Used  Substance and Sexual Activity   Alcohol use: Yes    Alcohol/week: 2.0 standard drinks of alcohol    Types: 1 Glasses of wine, 1 Shots of liquor per week    Comment: occ   Drug use: No   Sexual activity: Yes    Birth control/protection: None  Other Topics Concern   Not on file  Social History Narrative   Not on file   Social Drivers of Health   Financial Resource Strain: Low Risk  (12/22/2023)   Overall Financial Resource Strain (CARDIA)    Difficulty of Paying Living Expenses: Not hard at all  Food Insecurity: No Food Insecurity (12/22/2023)   Hunger Vital Sign    Worried About Running Out of Food in the Last Year: Never true    Ran Out of Food in the Last Year: Never true  Transportation Needs: No Transportation Needs (12/22/2023)   PRAPARE - Administrator, Civil Service (Medical): No    Lack of Transportation (Non-Medical): No  Physical Activity: Sufficiently Active (12/22/2023)   Exercise Vital Sign    Days of Exercise per Week: 5 days    Minutes of Exercise per  Session: 30 min  Stress: No Stress Concern Present (12/22/2023)   [employer] of Occupational Health - Occupational Stress Questionnaire    Feeling of Stress: Not at all  Social Connections: Moderately Isolated (12/22/2023)   Social Connection and Isolation Panel    Frequency of Communication with Friends and Family: More than three times a week    Frequency of Social Gatherings with Friends and Family: Three times a week    Attends Religious Services: Never    Active Member of Clubs or Organizations: No    Attends Banker Meetings: Never    Marital Status: Married    Tobacco Counseling Ready to quit: Not Answered Counseling given: Not Answered  Clinical Intake:  Pre-visit preparation completed: Yes  Pain : No/denies pain Pain Score: 0-No pain     BMI - recorded: 28.72 Nutritional Status: BMI 25 -29 Overweight Nutritional Risks: None Diabetes: No  Lab Results  Component Value Date   HGBA1C 5.8 09/03/2023   HGBA1C 6.2 (H) 03/04/2023   HGBA1C 6.1 (H) 02/28/2022     How often do you need to have someone help you when you read instructions, pamphlets, or other written materials from your doctor or pharmacy?: 1 - Never  Interpreter Needed?: No  Information entered by :: Shala Baumbach N. Marasia Newhall, LPN.   Activities of Daily Living     12/22/2023    9:25 AM  In your present state of health, do you have any difficulty performing the following activities:  Hearing? 0  Vision? 0  Difficulty concentrating or making decisions? 0  Comment PATIENT READS  Walking or climbing stairs? 0  Dressing or bathing? 0  Doing errands, shopping? 0  Preparing Food and eating ? N  Using the Toilet? N  In the past six months, have you accidently leaked urine? N  Do you have problems with loss of bowel control? N  Managing your Medications? N  Managing your Finances? N  Housekeeping or managing your Housekeeping? N    Patient Care Team: Delbert Clam, MD as PCP -  General (Family Medicine) [place], My [place], [place] as Referring Physician (Optometry)  I have updated your Care Teams any recent Medical Services you may have received from other providers in the past year.     Assessment:   This is a routine wellness examination for Danielle Berger.  Hearing/Vision screen Hearing Screening - Comments:: Denies hearing difficulties.  Vision Screening - Comments:: Wears reading glasses - up to date with routine eye exams with [facility].    Goals Addressed             This Visit's Progress    12/22/2023: To lose 5 pounds and stay active.         Depression Screen     12/22/2023    9:27 AM 09/03/2023    8:44 AM 03/04/2023    8:57 AM 12/16/2022    8:53 AM 09/01/2022    8:44 AM 03/24/2022    3:38 PM 03/24/2022    3:37 PM  PHQ 2/9 Scores  PHQ - 2 Score 0 0 0 0 0 0 0  PHQ- 9 Score 0 0 0  0      Fall Risk     12/22/2023    9:22 AM 09/03/2023    8:44 AM 07/24/2023    8:59 AM 03/04/2023    8:57 AM 12/16/2022    8:54 AM  Fall Risk   Falls in the past year? 0 0 0 0 0  Number falls in past yr: 0 0  0 0  Injury with Fall? 0 0  0 0  Risk for fall due to : No Fall Risks No Fall Risks  No Fall Risks No Fall Risks  Follow up Falls evaluation completed Falls evaluation completed  Falls evaluation completed Falls prevention discussed;Education provided;Falls evaluation completed    MEDICARE RISK AT HOME:  Medicare Risk at Home Any stairs in or around the home?: No If so, are there any without handrails?: No Home free of loose throw rugs in walkways, pet beds, electrical cords, etc?: Yes Adequate lighting in your home to reduce risk of falls?: Yes Life alert?: No Use of a cane, walker or  w/c?: No Grab bars in the bathroom?: Yes Shower chair or bench in shower?: No Elevated toilet seat or a handicapped toilet?: No  TIMED UP AND GO:  Was the test performed?  No  Cognitive Function: Declined/Normal: No cognitive concerns noted by patient or family. Patient alert,  oriented, able to answer questions appropriately and recall recent events. No signs of memory loss or confusion.    12/22/2023    9:26 AM 03/24/2022    3:38 PM 10/26/2020    9:09 AM 10/11/2019   10:38 AM  MMSE - Mini Mental State Exam  Not completed: Unable to complete     Orientation to time  5 5 5   Orientation to Place  5 5 5   Registration  3 3 3   Attention/ Calculation  5 5 0  Recall  3 3 2   Language- name 2 objects  2 2 2   Language- repeat  1 1 1   Language- follow 3 step command  3 3 1   Language- read & follow direction  1 1 1   Write a sentence  1 1 1   Copy design  1 1 1   Total score  30 30 22         12/22/2023    9:30 AM 12/16/2022    8:58 AM 03/24/2022    3:38 PM  6CIT Screen  What Year? 0 points 0 points 0 points  What month? 0 points 0 points 0 points  What time? 0 points 0 points 0 points  Count back from 20 0 points 0 points 0 points  Months in reverse 0 points 0 points   Repeat phrase 0 points 0 points 0 points  Total Score 0 points 0 points     Immunizations Immunization History  Administered Date(s) Administered   Influenza,inj,Quad PF,6+ Mos 02/19/2017, 12/14/2018, 01/09/2021, 02/10/2022   Influenza-Unspecified 02/04/2020, 01/27/2023   PFIZER(Purple Top)SARS-COV-2 Vaccination 07/01/2019, 07/22/2019, 02/25/2020   Pneumococcal Conjugate-13 10/11/2019   Pneumococcal Polysaccharide-23 07/09/2021   Tdap 06/10/2017   Zoster Recombinant(Shingrix) 04/10/2020, 06/11/2020    Screening Tests Health Maintenance  Topic Date Due   Diabetic kidney evaluation - Urine ACR  Never done   Influenza Vaccine  11/13/2023   COVID-19 Vaccine (4 - 2025-26 season) 12/14/2023   Lung Cancer Screening  07/14/2024   Diabetic kidney evaluation - eGFR measurement  09/02/2024   Medicare Annual Wellness (AWV)  12/21/2024   MAMMOGRAM  04/13/2025   DTaP/Tdap/Td (2 - Td or Tdap) 06/11/2027   Colonoscopy  02/27/2029   Pneumococcal Vaccine: 50+ Years  Completed   DEXA SCAN  Completed    Hepatitis C Screening  Completed   Zoster Vaccines- Shingrix  Completed   HPV VACCINES  Aged Out   Meningococcal B Vaccine  Aged Out    Health Maintenance Items Addressed: Yes Patient is due for Flu vaccine.  Additional Screening:  Vision Screening: Recommended annual ophthalmology exams for early detection of glaucoma and other disorders of the eye. Is the patient up to date with their annual eye exam?  Yes  Who is the provider or what is the name of the office in which the patient attends annual eye exams? Dr. My Phebe Daniels at [facility]  Dental Screening: Recommended annual dental exams for proper oral hygiene  Community Resource Referral / Chronic Care Management: CRR required this visit?  No   CCM required this visit?  No   Plan:    I have personally reviewed and noted the following in the patient's chart:  Medical and social history Use of alcohol, tobacco or illicit drugs  Current medications and supplements including opioid prescriptions. Patient is not currently taking opioid prescriptions. Functional ability and status Nutritional status Physical activity Advanced directives List of other physicians Hospitalizations, surgeries, and ER visits in previous 12 months Vitals Screenings to include cognitive, depression, and falls Referrals and appointments  In addition, I have reviewed and discussed with patient certain preventive protocols, quality metrics, and best practice recommendations. A written personalized care plan for preventive services as well as general preventive health recommendations were provided to patient.   Danielle LOISE Fuller, LPN   [id number]   After Visit Summary: (MyChart) Due to this being a telephonic visit, the after visit summary with patients personalized plan was offered to patient via MyChart   Notes: Nothing significant to report at this time.

## 2023-12-22 NOTE — Patient Instructions (Addendum)
 Danielle Berger,  Thank you for taking the time for your Medicare Wellness Visit. I appreciate your continued commitment to your health goals. Please review the care plan we discussed, and feel free to reach out if I can assist you further.  Medicare recommends these wellness visits once per year to help you and your care team stay ahead of potential health issues. These visits are designed to focus on prevention, allowing your provider to concentrate on managing your acute and chronic conditions during your regular appointments.  Please note that Annual Wellness Visits do not include a physical exam. Some assessments may be limited, especially if the visit was conducted virtually. If needed, we may recommend a separate in-person follow-up with your provider.  Ongoing Care Seeing your primary care provider every 3 to 6 months helps us  monitor your health and provide consistent, personalized care.   Referrals If a referral was made during today's visit and you haven't received any updates within two weeks, please contact the referred provider directly to check on the status.  Recommended Screenings:  Health Maintenance  Topic Date Due   Yearly kidney health urinalysis for diabetes  Never done   Flu Shot  11/13/2023   COVID-19 Vaccine (4 - 2025-26 season) 12/14/2023   Screening for Lung Cancer  07/14/2024   Yearly kidney function blood test for diabetes  09/02/2024   Medicare Annual Wellness Visit  12/21/2024   Mammogram  04/13/2025   DTaP/Tdap/Td vaccine (2 - Td or Tdap) 06/11/2027   Colon Cancer Screening  02/27/2029   Pneumococcal Vaccine for age over 5  Completed   DEXA scan (bone density measurement)  Completed   Hepatitis C Screening  Completed   Zoster (Shingles) Vaccine  Completed   HPV Vaccine  Aged Out   Meningitis B Vaccine  Aged Out       12/22/2023    9:22 AM  Advanced Directives  Does Patient Have a Medical Advance Directive? No  Would patient like information on  creating a medical advance directive? No - Patient declined   Advance Care Planning is important because it: Ensures you receive medical care that aligns with your values, goals, and preferences. Provides guidance to your family and loved ones, reducing the emotional burden of decision-making during critical moments.  Vision: Annual vision screenings are recommended for early detection of glaucoma, cataracts, and diabetic retinopathy. These exams can also reveal signs of chronic conditions such as diabetes and high blood pressure.  Dental: Annual dental screenings help detect early signs of oral cancer, gum disease, and other conditions linked to overall health, including heart disease and diabetes.  Please see the attached documents for additional preventive care recommendations.

## 2024-01-24 ENCOUNTER — Other Ambulatory Visit: Payer: Self-pay | Admitting: Family Medicine

## 2024-01-24 DIAGNOSIS — M25559 Pain in unspecified hip: Secondary | ICD-10-CM

## 2024-02-07 ENCOUNTER — Other Ambulatory Visit: Payer: Self-pay | Admitting: Family Medicine

## 2024-02-07 DIAGNOSIS — E038 Other specified hypothyroidism: Secondary | ICD-10-CM

## 2024-03-07 ENCOUNTER — Encounter: Payer: Self-pay | Admitting: Family Medicine

## 2024-03-07 ENCOUNTER — Ambulatory Visit: Attending: Family Medicine | Admitting: Family Medicine

## 2024-03-07 VITALS — BP 125/75 | HR 56 | Temp 98.1°F | Ht 62.0 in | Wt 154.0 lb

## 2024-03-07 DIAGNOSIS — R7303 Prediabetes: Secondary | ICD-10-CM | POA: Diagnosis not present

## 2024-03-07 DIAGNOSIS — I129 Hypertensive chronic kidney disease with stage 1 through stage 4 chronic kidney disease, or unspecified chronic kidney disease: Secondary | ICD-10-CM | POA: Diagnosis not present

## 2024-03-07 DIAGNOSIS — M25559 Pain in unspecified hip: Secondary | ICD-10-CM | POA: Diagnosis not present

## 2024-03-07 DIAGNOSIS — E038 Other specified hypothyroidism: Secondary | ICD-10-CM

## 2024-03-07 DIAGNOSIS — G4709 Other insomnia: Secondary | ICD-10-CM

## 2024-03-07 DIAGNOSIS — L989 Disorder of the skin and subcutaneous tissue, unspecified: Secondary | ICD-10-CM | POA: Diagnosis not present

## 2024-03-07 DIAGNOSIS — N183 Chronic kidney disease, stage 3 unspecified: Secondary | ICD-10-CM

## 2024-03-07 DIAGNOSIS — R42 Dizziness and giddiness: Secondary | ICD-10-CM

## 2024-03-07 DIAGNOSIS — R2689 Other abnormalities of gait and mobility: Secondary | ICD-10-CM

## 2024-03-07 LAB — POCT GLYCOSYLATED HEMOGLOBIN (HGB A1C): HbA1c, POC (prediabetic range): 5.9 % (ref 5.7–6.4)

## 2024-03-07 MED ORDER — LISINOPRIL-HYDROCHLOROTHIAZIDE 10-12.5 MG PO TABS: 1.0000 | ORAL_TABLET | Freq: Every day | ORAL | 3 refills | Status: AC

## 2024-03-07 MED ORDER — GABAPENTIN 300 MG PO CAPS: 300.0000 mg | ORAL_CAPSULE | Freq: Two times a day (BID) | ORAL | 0 refills | Status: AC

## 2024-03-07 MED ORDER — CARVEDILOL 12.5 MG PO TABS: 12.5000 mg | ORAL_TABLET | Freq: Two times a day (BID) | ORAL | 1 refills | Status: AC

## 2024-03-07 MED ORDER — TRAZODONE HCL 100 MG PO TABS: 100.0000 mg | ORAL_TABLET | Freq: Every evening | ORAL | 3 refills | Status: AC | PRN

## 2024-03-07 MED ORDER — ROSUVASTATIN CALCIUM 20 MG PO TABS: 20.0000 mg | ORAL_TABLET | Freq: Every day | ORAL | 3 refills | Status: AC

## 2024-03-07 NOTE — Patient Instructions (Signed)
 VISIT SUMMARY:  Today, we discussed your concerns about intermittent imbalance and a mole on your right side. We also reviewed your ongoing management for prediabetes, hypertension, hypothyroidism, hyperlipidemia, and insomnia. Routine health maintenance was also addressed.  YOUR PLAN:  -RECURRENT EPISODES OF IMBALANCE: You are experiencing episodes of imbalance without dizziness or ear symptoms. Your blood pressure is well-controlled, and previous tests were normal. I recommend changing positions slowly to help manage these episodes.  -PREDIABETES: Your A1c level is at 5.9, which is slightly increased from 5.8 but improved from 6.2 in 2024. This indicates that your current management through diet and lifestyle changes is effective. Please continue with your current management plan.  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 Continue with your current blood pressure medication, and we will monitor your kidney function.  -OTHER SPECIFIED HYPOTHYROIDISM: We will assess your thyroid  function with upcoming lab tests. Depending on the results, we may need to adjust your levothyroxine  dose.  -MIXED HYPERLIPIDEMIA: We will monitor your cholesterol levels with upcoming lab tests to ensure they are within a healthy range.  -OTHER INSOMNIA: You are continuing to use trazodone  as needed for sleep management. Please continue taking it as needed.  -CHRONIC RIGHT-SIDED MOLE, REFERRED FOR DERMATOLOGY EVALUATION: You have a mole on your right side that has been present for a long time. I have referred you to dermatology for a biopsy to ensure it is not a cause for concern.  INSTRUCTIONS:  Please follow up in six months unless you have any concerns before then. Additionally, make sure to complete the upcoming lab tests for thyroid  function and cholesterol levels, and attend the dermatology  appointment for the mole biopsy.

## 2024-03-07 NOTE — Progress Notes (Addendum)
 Subjective:  Patient ID: Danielle Berger, female    DOB: October 21, 1955  Age: 68 y.o. MRN: [id number]  CC: Medical Management of Chronic Issues (Still having dizzy spells and off balance/Mole on right side)     Discussed the use of AI scribe software for clinical note transcription with the patient, who gave verbal consent to proceed.  History of Present Illness Danielle Berger is a 68 year old female with a history of Tobacco abuse (>49 pack year), hypertension, hyperlipidemia, hypothyroidism, stage III CKD. Insomnia who presents with concerns about dizziness and a mole.  She experiences intermittent episodes of imbalance, described as losing her balance rather than true dizziness. These episodes occur unpredictably and are not associated with positional changes. She has not experienced any falls. There is no ear pain or fullness.  She has a mole on her right side that has been present for a long time. It causes no discomfort but is a concern for her.  She is being monitored for prediabetes, with her most recent A1c at 5.9. She manages her condition through diet and lifestyle changes.  Endorses adherence with her antihypertensive.  Her current medications include gabapentin , a sleep aid, thyroid  medication, and cholesterol medication. She is not currently taking Wellbutrin .    Past Medical History:  Diagnosis Date   Chest pain 07/17/2015   Chronic renal disease    AS A CHILD   Esophageal reflux    PT. DENIES 02/15/19   Headache    HLD (hyperlipidemia)    Hypertension    Hypothyroidism    Tobacco abuse 07/17/2015    Past Surgical History:  Procedure Laterality Date   Brain Tumor removed      when patient was in the 6th grade   KIDNEY SURGERY     when patient was int he 8th grade    Family History  Adopted: Yes    Social History   Socioeconomic History   Marital status: Married    Spouse name: Not on file   Number of children: 1   Years of education: Not  on file   Highest education level: 12th grade  Occupational History   Not on file  Tobacco Use   Smoking status: Every Day    Current packs/day: 0.50    Types: Cigarettes   Smokeless tobacco: Never  Vaping Use   Vaping status: Never Used  Substance and Sexual Activity   Alcohol use: Yes    Alcohol/week: 2.0 standard drinks of alcohol    Types: 1 Glasses of wine, 1 Shots of liquor per week    Comment: occ   Drug use: No   Sexual activity: Yes    Birth control/protection: None  Other Topics Concern   Not on file  Social History Narrative   Not on file   Social Drivers of Health   Financial Resource Strain: Low Risk  (12/22/2023)   Overall Financial Resource Strain (CARDIA)    Difficulty of Paying Living Expenses: Not hard at all  Food Insecurity: No Food Insecurity (12/22/2023)   Hunger Vital Sign    Worried About Running Out of Food in the Last Year: Never true    Ran Out of Food in the Last Year: Never true  Transportation Needs: No Transportation Needs (12/22/2023)   PRAPARE - Administrator, Civil Service (Medical): No    Lack of Transportation (Non-Medical): No  Physical Activity: Sufficiently Active (12/22/2023)   Exercise Vital Sign    Days of Exercise  per Week: 5 days    Minutes of Exercise per Session: 30 min  Stress: No Stress Concern Present (12/22/2023)   [employer] of Occupational Health - Occupational Stress Questionnaire    Feeling of Stress: Not at all  Social Connections: Moderately Isolated (12/22/2023)   Social Connection and Isolation Panel    Frequency of Communication with Friends and Family: More than three times a week    Frequency of Social Gatherings with Friends and Family: Three times a week    Attends Religious Services: Never    Active Member of Clubs or Organizations: No    Attends Banker Meetings: Never    Marital Status: Married    Allergies  Allergen Reactions   Codeine Nausea And Vomiting   Penicillins Rash     Outpatient Medications Prior to Visit  Medication Sig Dispense Refill   buPROPion  (WELLBUTRIN  XL) 150 MG 24 hr tablet Take 1 tablet (150 mg total) by mouth daily. For smoking cessation 90 tablet 1   levothyroxine  (SYNTHROID ) 75 MCG tablet TAKE 1 TABLET BY MOUTH DAILY  BEFORE BREAKFAST 100 tablet 2   lidocaine  (LIDODERM ) 5 % Place 1 patch onto the skin daily. Remove & Discard patch within 12 hours or as directed by MD 30 patch 3   meloxicam  (MOBIC ) 7.5 MG tablet Take 1 tablet (7.5 mg total) by mouth daily. 30 tablet 1   Omega-3 Fatty Acids (FISH OIL PO) Take by mouth.     carvedilol  (COREG ) 12.5 MG tablet TAKE 1 TABLET BY MOUTH TWICE  DAILY WITH A MEAL 200 tablet 1   gabapentin  (NEURONTIN ) 300 MG capsule TAKE 1 CAPSULE BY MOUTH TWICE  DAILY 200 capsule 0   lisinopril -hydrochlorothiazide  (ZESTORETIC ) 10-12.5 MG tablet TAKE 1 TABLET BY MOUTH DAILY 100 tablet 1   rosuvastatin  (CRESTOR ) 20 MG tablet Take 1 tablet (20 mg total) by mouth daily. 90 tablet 3   traZODone  (DESYREL ) 100 MG tablet Take 1 tablet (100 mg total) by mouth at bedtime as needed for sleep. 90 tablet 3   aspirin  EC 81 MG EC tablet Take 1 tablet (81 mg total) by mouth daily. (Patient not taking: Reported on 03/07/2024)     No facility-administered medications prior to visit.     ROS Review of Systems  Constitutional:  Negative for activity change and appetite change.  HENT:  Negative for sinus pressure and sore throat.   Respiratory:  Negative for chest tightness, shortness of breath and wheezing.   Cardiovascular:  Negative for chest pain and palpitations.  Gastrointestinal:  Negative for abdominal distention, abdominal pain and constipation.  Genitourinary: Negative.   Musculoskeletal: Negative.   Skin:  Positive for rash.  Psychiatric/Behavioral:  Negative for behavioral problems and dysphoric mood.     Objective:  BP 125/75   Pulse (!) 56   Temp 98.1 F (36.7 C) (Oral)   Ht 5' 2 (1.575 m)   Wt 154 lb (69.9  kg)   SpO2 99%   BMI 28.17 kg/m      03/07/2024    8:37 AM 12/22/2023    9:20 AM 09/03/2023    8:39 AM  BP/Weight  Systolic BP 125  863  Diastolic BP 75  79  Wt. (Lbs) 154 157 155.8  BMI 28.17 kg/m2 28.72 kg/m2 28.5 kg/m2      Physical Exam Constitutional:      Appearance: She is well-developed.  HENT:     Right Ear: Tympanic membrane normal.     Left Ear: Tympanic  membrane normal.  Cardiovascular:     Rate and Rhythm: Normal rate.     Heart sounds: Normal heart sounds. No murmur heard. Pulmonary:     Effort: Pulmonary effort is normal.     Breath sounds: Normal breath sounds. No wheezing or rales.  Chest:     Chest wall: No tenderness.  Abdominal:     General: Bowel sounds are normal. There is no distension.     Palpations: Abdomen is soft. There is no mass.     Tenderness: There is no abdominal tenderness.  Musculoskeletal:        General: Normal range of motion.     Right lower leg: No edema.     Left lower leg: No edema.  Skin:    Comments: See image below  Neurological:     Mental Status: She is alert and oriented to person, place, and time.  Psychiatric:        Mood and Affect: Mood normal.        Latest Ref Rng & Units 09/03/2023    9:11 AM 03/04/2023    9:27 AM 09/01/2022    9:14 AM  CMP  Glucose 70 - 99 mg/dL 896  897  899   BUN 8 - 27 mg/dL 26  28  21    Creatinine 0.57 - 1.00 mg/dL 8.53  8.39  8.60   Sodium 134 - 144 mmol/L 139  139  142   Potassium 3.5 - 5.2 mmol/L 4.9  5.1  4.6   Chloride 96 - 106 mmol/L 100  101  104   CO2 20 - 29 mmol/L 25  23  23    Calcium  8.7 - 10.3 mg/dL 9.6  9.5  9.3   Total Protein 6.0 - 8.5 g/dL 6.8   6.6   Total Bilirubin 0.0 - 1.2 mg/dL 0.4   0.4   Alkaline Phos 44 - 121 IU/L 102   107   AST 0 - 40 IU/L 20   22   ALT 0 - 32 IU/L 15   20     Lipid Panel     Component Value Date/Time   CHOL 199 09/03/2023 0911   TRIG 226 (H) 09/03/2023 0911   HDL 44 09/03/2023 0911   CHOLHDL 4.8 (H) 01/09/2021 0906   CHOLHDL  3.7 05/07/2016 0946   VLDL 34 (H) 05/07/2016 0946   LDLCALC 116 (H) 09/03/2023 0911    CBC    Component Value Date/Time   WBC 5.6 09/03/2023 0911   WBC 7.6 07/17/2015 1011   RBC 4.28 09/03/2023 0911   RBC 5.08 07/17/2015 1011   HGB 12.9 09/03/2023 0911   HCT 40.3 09/03/2023 0911   PLT 245 09/03/2023 0911   MCV 94 09/03/2023 0911   MCH 30.1 09/03/2023 0911   MCH 28.3 07/17/2015 1011   MCHC 32.0 09/03/2023 0911   MCHC 32.0 07/17/2015 1011   RDW 12.5 09/03/2023 0911   LYMPHSABS 1.5 09/03/2023 0911   MONOABS 0.5 07/17/2015 1011   EOSABS 0.1 09/03/2023 0911   BASOSABS 0.0 09/03/2023 0911    Lab Results  Component Value Date   HGBA1C 5.9 03/07/2024   Lab Results  Component Value Date   HGBA1C 5.9 03/07/2024   HGBA1C 5.8 09/03/2023   HGBA1C 6.2 (H) 03/04/2023    Lab Results  Component Value Date   TSH 2.130 09/03/2023        Assessment & Plan Recurrent episodes of imbalance Intermittent imbalance without dizziness or ear symptoms. Blood pressure  controlled. Previous tests normal. - Advised to change positions slowly.  Prediabetes A1c at 5.9, slightly increased from 5.8, improved from 6.2 in 2024. Current management effective. - Continue current management and lifestyle modifications.  Benign hypertension with stage III chronic kidney disease Blood pressure controlled. Kidney function slightly abnormal but stable. - Continue current antihypertensive regimen. - Monitor kidney function; last creatinine was 1.46 - Avoid nephrotoxins  Other specified hypothyroidism Normal TSH Thyroid  function to be assessed with upcoming labs. Levothyroxine  dose may need adjustment. - Ordered thyroid  function tests. - Adjust levothyroxine  dose based on lab results.  Mixed hyperlipidemia Controlled Cholesterol levels to be monitored with upcoming labs. - Ordered cholesterol panel.  Other insomnia Continues trazodone  for sleep management. - Continue trazodone  as needed for  sleep.  Skin lesion Chronic right-sided mole, referred for dermatology evaluation Referral for biopsy due to long-standing presence. - Referred to dermatology for mole biopsy.  General Health Maintenance Routine health maintenance discussed. - Scheduled follow-up in six months unless concerns arise.      Meds ordered this encounter  Medications   carvedilol  (COREG ) 12.5 MG tablet    Sig: Take 1 tablet (12.5 mg total) by mouth 2 (two) times daily with a meal.    Dispense:  200 tablet    Refill:  1   gabapentin  (NEURONTIN ) 300 MG capsule    Sig: Take 1 capsule (300 mg total) by mouth 2 (two) times daily.    Dispense:  200 capsule    Refill:  0    Please send a replace/new response with 100-Day Supply if appropriate to maximize member benefit. Requesting 1 year supply.   lisinopril -hydrochlorothiazide  (ZESTORETIC ) 10-12.5 MG tablet    Sig: Take 1 tablet by mouth daily.    Dispense:  90 tablet    Refill:  3   rosuvastatin  (CRESTOR ) 20 MG tablet    Sig: Take 1 tablet (20 mg total) by mouth daily.    Dispense:  90 tablet    Refill:  3    Discontinue atorvastatin    traZODone  (DESYREL ) 100 MG tablet    Sig: Take 1 tablet (100 mg total) by mouth at bedtime as needed for sleep.    Dispense:  90 tablet    Refill:  3    Dose increase    Follow-up: Return in about 6 months (around 09/04/2024) for Chronic medical conditions.       Corrina Sabin, MD, FAAFP. [facility] [place], [place] [phone]   03/07/2024, 12:43 PM

## 2024-03-08 ENCOUNTER — Ambulatory Visit: Payer: Self-pay | Admitting: Family Medicine

## 2024-03-08 DIAGNOSIS — E038 Other specified hypothyroidism: Secondary | ICD-10-CM

## 2024-03-08 LAB — CMP14+EGFR
ALT: 17 IU/L (ref 0–32)
AST: 24 IU/L (ref 0–40)
Albumin: 4.5 g/dL (ref 3.9–4.9)
Alkaline Phosphatase: 89 IU/L (ref 49–135)
BUN/Creatinine Ratio: 18 (ref 12–28)
BUN: 35 mg/dL — ABNORMAL HIGH (ref 8–27)
Bilirubin Total: 0.3 mg/dL (ref 0.0–1.2)
CO2: 24 mmol/L (ref 20–29)
Calcium: 9.4 mg/dL (ref 8.7–10.3)
Chloride: 104 mmol/L (ref 96–106)
Creatinine, Ser: 1.93 mg/dL — ABNORMAL HIGH (ref 0.57–1.00)
Globulin, Total: 2.3 g/dL (ref 1.5–4.5)
Glucose: 114 mg/dL — ABNORMAL HIGH (ref 70–99)
Potassium: 5.3 mmol/L — ABNORMAL HIGH (ref 3.5–5.2)
Sodium: 144 mmol/L (ref 134–144)
Total Protein: 6.8 g/dL (ref 6.0–8.5)
eGFR: 28 mL/min/1.73 — ABNORMAL LOW (ref >59)

## 2024-03-08 LAB — LP+NON-HDL CHOLESTEROL
Cholesterol, Total: 177 mg/dL (ref 100–199)
HDL: 48 mg/dL (ref >39)
LDL Chol Calc (NIH): 99 mg/dL (ref 0–99)
Total Non-HDL-Chol (LDL+VLDL): 129 mg/dL (ref 0–129)
Triglycerides: 172 mg/dL — ABNORMAL HIGH (ref 0–149)
VLDL Cholesterol Cal: 30 mg/dL (ref 5–40)

## 2024-03-08 LAB — T3: T3, Total: 88 ng/dL (ref 71–180)

## 2024-03-08 LAB — T4, FREE: Free T4: 1.72 ng/dL (ref 0.82–1.77)

## 2024-03-08 LAB — TSH: TSH: 0.921 u[IU]/mL (ref 0.450–4.500)

## 2024-03-08 NOTE — Telephone Encounter (Signed)
 FYI

## 2024-03-08 NOTE — Telephone Encounter (Signed)
 Copied from CRM #[id number]. Topic: Clinical - Lab/Test Results >> Mar 08, 2024  3:46 PM Fonda T wrote:  Reason for CRM: Pt calling to speak with office regarding test results.   Pt given results as per provider comments in chart.  Pt states she verbalized understanding, and no questions at present, but will call back should she have any questions or concerns.   If need to speak further can be reached at [phone]   Thank you

## 2024-04-18 ENCOUNTER — Telehealth: Payer: Self-pay | Admitting: Family Medicine

## 2024-04-18 NOTE — Telephone Encounter (Signed)
 Contacted patient, Appointment scheduled.

## 2024-04-18 NOTE — Telephone Encounter (Signed)
 Copied from CRM [id number]. Topic: Appointments - Appointment Scheduling >> Apr 18, 2024  8:59 AM Tiffany B wrote:  Patient would like to see PCP only regarding her knot on her right hand thumb for a few weeks. Patient would ike to know if PCP can work her in sooner then 06/01/2023, patient scheduled for 06/01/2023 and has been placed on the wait list.

## 2024-04-29 ENCOUNTER — Telehealth: Payer: Self-pay | Admitting: Family Medicine

## 2024-04-29 NOTE — Telephone Encounter (Signed)
 Pt confirmed appt

## 2024-05-03 ENCOUNTER — Encounter: Payer: Self-pay | Admitting: Family Medicine

## 2024-05-03 ENCOUNTER — Ambulatory Visit: Attending: Family Medicine | Admitting: Family Medicine

## 2024-05-03 VITALS — BP 94/61 | HR 67 | Temp 98.4°F | Ht 62.0 in | Wt 157.4 lb

## 2024-05-03 DIAGNOSIS — I129 Hypertensive chronic kidney disease with stage 1 through stage 4 chronic kidney disease, or unspecified chronic kidney disease: Secondary | ICD-10-CM | POA: Diagnosis not present

## 2024-05-03 DIAGNOSIS — M67449 Ganglion, unspecified hand: Secondary | ICD-10-CM

## 2024-05-03 DIAGNOSIS — N183 Chronic kidney disease, stage 3 unspecified: Secondary | ICD-10-CM | POA: Diagnosis not present

## 2024-05-03 NOTE — Progress Notes (Signed)
 "  Subjective:  Patient ID: Danielle Berger, female    DOB: Oct 01, 1955  Age: 69 y.o. MRN: [id number]  CC: Cyst (Knot on right thumb X 1 month)     Discussed the use of AI scribe software for clinical note transcription with the patient, who gave verbal consent to proceed.  History of Present Illness Danielle Berger is a 69 year old female with a history of Tobacco abuse (>49 pack year), hypertension, hyperlipidemia, hypothyroidism, stage III CKD. Insomnia who presents with a cyst on her finger.  A cyst appeared on her right thumb about one month ago. It was initially small and has become slightly larger. It is not painful to touch but is painful if bumped, She does not have similar lesions on other body parts.  Her blood pressure is slightly low today, but she has not had dizziness or lightheadedness.She is adherent with her Coreg  and Lisinopril /hydrochlorothiazide .    Past Medical History:  Diagnosis Date   Chest pain 07/17/2015   Chronic renal disease    AS A CHILD   Esophageal reflux    PT. DENIES 02/15/19   Headache    HLD (hyperlipidemia)    Hypertension    Hypothyroidism    Tobacco abuse 07/17/2015    Past Surgical History:  Procedure Laterality Date   Brain Tumor removed      when patient was in the 6th grade   KIDNEY SURGERY     when patient was int he 8th grade    Family History  Adopted: Yes    Social History   Socioeconomic History   Marital status: Married    Spouse name: Not on file   Number of children: 1   Years of education: Not on file   Highest education level: 12th grade  Occupational History   Not on file  Tobacco Use   Smoking status: Every Day    Current packs/day: 0.50    Types: Cigarettes   Smokeless tobacco: Never  Vaping Use   Vaping status: Never Used  Substance and Sexual Activity   Alcohol use: Yes    Alcohol/week: 2.0 standard drinks of alcohol    Types: 1 Glasses of wine, 1 Shots of liquor per week    Comment:  occ   Drug use: No   Sexual activity: Yes    Birth control/protection: None  Other Topics Concern   Not on file  Social History Narrative   Not on file   Social Drivers of Health   Tobacco Use: High Risk (05/03/2024)   Patient History    Smoking Tobacco Use: Every Day    Smokeless Tobacco Use: Never    Passive Exposure: Not on file  Financial Resource Strain: Low Risk (12/22/2023)   Overall Financial Resource Strain (CARDIA)    Difficulty of Paying Living Expenses: Not hard at all  Food Insecurity: No Food Insecurity (12/22/2023)   Epic    Worried About Radiation Protection Practitioner of Food in the Last Year: Never true    Ran Out of Food in the Last Year: Never true  Transportation Needs: No Transportation Needs (12/22/2023)   Epic    Lack of Transportation (Medical): No    Lack of Transportation (Non-Medical): No  Physical Activity: Sufficiently Active (12/22/2023)   Exercise Vital Sign    Days of Exercise per Week: 5 days    Minutes of Exercise per Session: 30 min  Stress: No Stress Concern Present (12/22/2023)   [employer] of Occupational Health - Occupational  Stress Questionnaire    Feeling of Stress: Not at all  Social Connections: Moderately Isolated (12/22/2023)   Social Connection and Isolation Panel    Frequency of Communication with Friends and Family: More than three times a week    Frequency of Social Gatherings with Friends and Family: Three times a week    Attends Religious Services: Never    Active Member of Clubs or Organizations: No    Attends Banker Meetings: Never    Marital Status: Married  Depression (PHQ2-9): Low Risk (03/07/2024)   Depression (PHQ2-9)    PHQ-2 Score: 0  Alcohol Screen: Low Risk (12/22/2023)   Alcohol Screen    Last Alcohol Screening Score (AUDIT): 0  Housing: Low Risk (12/22/2023)   Epic    Unable to Pay for Housing in the Last Year: No    Number of Times Moved in the Last Year: 0    Homeless in the Last Year: No  Utilities: Not At Risk  (12/22/2023)   Epic    Threatened with loss of utilities: No  Health Literacy: Adequate Health Literacy (12/22/2023)   B1300 Health Literacy    Frequency of need for help with medical instructions: Never    Allergies[1]  Outpatient Medications Prior to Visit  Medication Sig Dispense Refill   buPROPion  (WELLBUTRIN  XL) 150 MG 24 hr tablet Take 1 tablet (150 mg total) by mouth daily. For smoking cessation 90 tablet 1   carvedilol  (COREG ) 12.5 MG tablet Take 1 tablet (12.5 mg total) by mouth 2 (two) times daily with a meal. 200 tablet 1   gabapentin  (NEURONTIN ) 300 MG capsule Take 1 capsule (300 mg total) by mouth 2 (two) times daily. 200 capsule 0   levothyroxine  (SYNTHROID ) 75 MCG tablet TAKE 1 TABLET BY MOUTH DAILY  BEFORE BREAKFAST 100 tablet 2   lidocaine  (LIDODERM ) 5 % Place 1 patch onto the skin daily. Remove & Discard patch within 12 hours or as directed by MD 30 patch 3   lisinopril -hydrochlorothiazide  (ZESTORETIC ) 10-12.5 MG tablet Take 1 tablet by mouth daily. 90 tablet 3   meloxicam  (MOBIC ) 7.5 MG tablet Take 1 tablet (7.5 mg total) by mouth daily. 30 tablet 1   Omega-3 Fatty Acids (FISH OIL PO) Take by mouth.     rosuvastatin  (CRESTOR ) 20 MG tablet Take 1 tablet (20 mg total) by mouth daily. 90 tablet 3   traZODone  (DESYREL ) 100 MG tablet Take 1 tablet (100 mg total) by mouth at bedtime as needed for sleep. 90 tablet 3   aspirin  EC 81 MG EC tablet Take 1 tablet (81 mg total) by mouth daily. (Patient not taking: Reported on 05/03/2024)     No facility-administered medications prior to visit.     ROS Review of Systems  Constitutional:  Negative for activity change and appetite change.  HENT:  Negative for sinus pressure and sore throat.   Respiratory:  Negative for chest tightness, shortness of breath and wheezing.   Cardiovascular:  Negative for chest pain and palpitations.  Gastrointestinal:  Negative for abdominal distention, abdominal pain and constipation.  Genitourinary:  Negative.   Musculoskeletal: Negative.   Psychiatric/Behavioral:  Negative for behavioral problems and dysphoric mood.     Objective:  BP 94/61   Pulse 67   Temp 98.4 F (36.9 C) (Oral)   Ht 5' 2 (1.575 m)   Wt 157 lb 6.4 oz (71.4 kg)   SpO2 98%   BMI 28.79 kg/m      05/03/2024  2:16 PM 03/07/2024    8:37 AM 12/22/2023    9:20 AM  BP/Weight  Systolic BP 94 125   Diastolic BP 61 75   Wt. (Lbs) 157.4 154 157  BMI 28.79 kg/m2 28.17 kg/m2 28.72 kg/m2      Physical Exam Constitutional:      Appearance: She is well-developed.  Cardiovascular:     Rate and Rhythm: Normal rate.     Heart sounds: Normal heart sounds. No murmur heard. Pulmonary:     Effort: Pulmonary effort is normal.     Breath sounds: Normal breath sounds. No wheezing or rales.  Chest:     Chest wall: No tenderness.  Abdominal:     General: Bowel sounds are normal. There is no distension.     Palpations: Abdomen is soft. There is no mass.     Tenderness: There is no abdominal tenderness.  Musculoskeletal:        General: Normal range of motion.     Right lower leg: No edema.     Left lower leg: No edema.     Comments: Non tender small cyst on medial aspect of right thumb Left thumb is normal. Able to make a fist bilaterally  Neurological:     Mental Status: She is alert and oriented to person, place, and time.  Psychiatric:        Mood and Affect: Mood normal.        Latest Ref Rng & Units 03/07/2024    9:31 AM 09/03/2023    9:11 AM 03/04/2023    9:27 AM  CMP  Glucose 70 - 99 mg/dL 885  896  897   BUN 8 - 27 mg/dL 35  26  28   Creatinine 0.57 - 1.00 mg/dL 8.06  8.53  8.39   Sodium 134 - 144 mmol/L 144  139  139   Potassium 3.5 - 5.2 mmol/L 5.3  4.9  5.1   Chloride 96 - 106 mmol/L 104  100  101   CO2 20 - 29 mmol/L 24  25  23    Calcium  8.7 - 10.3 mg/dL 9.4  9.6  9.5   Total Protein 6.0 - 8.5 g/dL 6.8  6.8    Total Bilirubin 0.0 - 1.2 mg/dL 0.3  0.4    Alkaline Phos 49 - 135 IU/L 89   102    AST 0 - 40 IU/L 24  20    ALT 0 - 32 IU/L 17  15      Lipid Panel     Component Value Date/Time   CHOL 177 03/07/2024 0931   TRIG 172 (H) 03/07/2024 0931   HDL 48 03/07/2024 0931   CHOLHDL 4.8 (H) 01/09/2021 0906   CHOLHDL 3.7 05/07/2016 0946   VLDL 34 (H) 05/07/2016 0946   LDLCALC 99 03/07/2024 0931    CBC    Component Value Date/Time   WBC 5.6 09/03/2023 0911   WBC 7.6 07/17/2015 1011   RBC 4.28 09/03/2023 0911   RBC 5.08 07/17/2015 1011   HGB 12.9 09/03/2023 0911   HCT 40.3 09/03/2023 0911   PLT 245 09/03/2023 0911   MCV 94 09/03/2023 0911   MCH 30.1 09/03/2023 0911   MCH 28.3 07/17/2015 1011   MCHC 32.0 09/03/2023 0911   MCHC 32.0 07/17/2015 1011   RDW 12.5 09/03/2023 0911   LYMPHSABS 1.5 09/03/2023 0911   MONOABS 0.5 07/17/2015 1011   EOSABS 0.1 09/03/2023 0911   BASOSABS 0.0 09/03/2023 0911  Lab Results  Component Value Date   HGBA1C 5.9 03/07/2024       Assessment & Plan Ganglion cyst of finger Possible ganglion cyst. May resolve spontaneously or need intervention. - Monitor for changes in size or symptoms. - Use Voltaren  cream for pain if needed. - Refer to hand specialist if cyst enlarges or becomes bothersome.  Benign hypertension with chronic kidney disease, stage III Blood pressure slightly low, asymptomatic. Continues on antihypertensive regimen. - Continue current antihypertensive medications.          Follow-up: Return for previously scheduled appointment.       Corrina Sabin, MD, FAAFP. [facility] [place], [place] [phone]   05/03/2024, 2:41 PM     [1]  Allergies Allergen Reactions   Codeine Nausea And Vomiting   Penicillins Rash   "

## 2024-05-03 NOTE — Patient Instructions (Signed)
 VISIT SUMMARY:  Today, you came in because of a cyst on your finger that has been present for about a month. We also reviewed your blood pressure and kidney health.  YOUR PLAN:  -FINGER CYST: You have a cyst on your finger, which might be a ganglion cyst. This type of cyst can sometimes go away on its own, but it might need treatment if it gets bigger or causes problems. You should monitor the cyst for any changes in size or symptoms. If you experience pain, you can use Voltaren  cream. If the cyst becomes larger or more bothersome, we will refer you to a hand specialist.  -BENIGN HYPERTENSION WITH CHRONIC KIDNEY DISEASE, STAGE III: You have high blood pressure and chronic kidney disease. Your blood pressure was slightly low today, but you did not have any symptoms like dizziness or lightheadedness. Continue taking your current blood pressure medications as prescribed.  INSTRUCTIONS:  Please monitor the cyst on your finger for any changes in size or symptoms. Use Voltaren  cream for pain if needed. If the cyst becomes larger or more bothersome, contact us  for a referral to a hand specialist. Continue taking your blood pressure medications as prescribed. Follow up with us  if you experience any new symptoms or have concerns.

## 2024-05-31 ENCOUNTER — Ambulatory Visit: Payer: Self-pay | Admitting: Family Medicine

## 2024-09-06 ENCOUNTER — Ambulatory Visit: Admitting: Family Medicine

## 2024-12-27 ENCOUNTER — Ambulatory Visit
